# Patient Record
Sex: Male | Born: 1965
Health system: Southern US, Community
[De-identification: ages and names within clinical notes are randomized; demographics above are authoritative.]

## PROBLEM LIST (undated history)

## (undated) DIAGNOSIS — I82409 Acute embolism and thrombosis of unspecified deep veins of unspecified lower extremity: Secondary | ICD-10-CM

## (undated) DIAGNOSIS — I2699 Other pulmonary embolism without acute cor pulmonale: Secondary | ICD-10-CM

---

## 2007-11-01 ENCOUNTER — Emergency Department (HOSPITAL_COMMUNITY): Admission: EM | Admit: 2007-11-01 | Discharge: 2007-11-01 | Payer: Self-pay | Admitting: Emergency Medicine

## 2007-11-23 ENCOUNTER — Emergency Department (HOSPITAL_COMMUNITY): Admission: EM | Admit: 2007-11-23 | Discharge: 2007-11-23 | Payer: Self-pay | Admitting: Emergency Medicine

## 2008-04-19 ENCOUNTER — Ambulatory Visit: Payer: Self-pay | Admitting: Psychiatry

## 2008-04-19 ENCOUNTER — Inpatient Hospital Stay (HOSPITAL_COMMUNITY): Admission: RE | Admit: 2008-04-19 | Discharge: 2008-04-25 | Payer: Self-pay | Admitting: Psychiatry

## 2008-04-19 ENCOUNTER — Emergency Department (HOSPITAL_COMMUNITY): Admission: EM | Admit: 2008-04-19 | Discharge: 2008-04-19 | Payer: Self-pay | Admitting: Emergency Medicine

## 2010-06-30 LAB — RAPID URINE DRUG SCREEN, HOSP PERFORMED
Barbiturates: NOT DETECTED
Cocaine: POSITIVE — AB
Opiates: NOT DETECTED

## 2010-06-30 LAB — CBC
HCT: 39.9 % (ref 39.0–52.0)
Hemoglobin: 13 g/dL (ref 13.0–17.0)
MCHC: 32.6 g/dL (ref 30.0–36.0)
MCV: 80.2 fL (ref 78.0–100.0)
RDW: 15.1 % (ref 11.5–15.5)

## 2010-06-30 LAB — DIFFERENTIAL
Basophils Absolute: 0 10*3/uL (ref 0.0–0.1)
Basophils Relative: 0 % (ref 0–1)
Eosinophils Absolute: 0 10*3/uL (ref 0.0–0.7)
Eosinophils Relative: 0 % (ref 0–5)
Lymphocytes Relative: 9 % — ABNORMAL LOW (ref 12–46)
Monocytes Absolute: 0.5 10*3/uL (ref 0.1–1.0)

## 2010-06-30 LAB — HEPATIC FUNCTION PANEL
ALT: 25 U/L (ref 0–53)
Bilirubin, Direct: 0.2 mg/dL (ref 0.0–0.3)
Indirect Bilirubin: 0.7 mg/dL (ref 0.3–0.9)

## 2010-06-30 LAB — BASIC METABOLIC PANEL
BUN: 11 mg/dL (ref 6–23)
CO2: 24 mEq/L (ref 19–32)
Chloride: 101 mEq/L (ref 96–112)
Glucose, Bld: 99 mg/dL (ref 70–99)
Potassium: 3.6 mEq/L (ref 3.5–5.1)
Sodium: 137 mEq/L (ref 135–145)

## 2010-07-28 NOTE — H&P (Signed)
NAMEJAQUAY, POSTHUMUS NO.:  [id number]   MEDICAL RECORD NO.:  [id number]          PATIENT TYPE:  IPS   LOCATION:  0508                          FACILITY:  [facility]   PHYSICIAN:  Vic Ripper, P.A.-C.DATE OF BIRTH:  [birth year]   DATE OF ADMISSION:  04/19/2008  DATE OF DISCHARGE:                       PSYCHIATRIC ADMISSION ASSESSMENT   This is an involuntary admission to the services of Dr. Geoffery Lyons.   IDENTIFYING INFORMATION:  This is a 45 year old, married, African  American male.  He presented to [facility] ED requesting detox and  reporting suicidal ideation.  He stated he needed to be detoxed from  alcohol and cocaine.  He said he wanted to die.  He stated that he hears  voices that tell him he is bad.  Right now, he feels so bad he does not  want to be around people and feels it would be better off if he did just  die.   PAST PSYCHIATRIC HISTORY:  Apparently he does not have any.   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  He has been  married once.  He has an 31 year old son, twins, boy and girl, age 7,  and a 93 year old daughter.  He last worked approximately 3 to 4 weeks  ago and has lost his employment.   FAMILY HISTORY:  He denies.   ALCOHOL AND DRUG HISTORY:  He reports he drinks a pint daily.   PRIMARY CARE Raydell Maners:  [facility].   MEDICAL PROBLEMS:  He has in the past been treated for a swollen  prostate.   MEDICATIONS:  He is not prescribed any.   DRUG ALLERGIES:  No known drug allergies.   POSITIVE PHYSICAL FINDINGS:  His alcohol level was 67.  His UDS was  positive for cocaine.  Vital signs on admission to our unit show he is  67 inches tall, weighs 151, temperature is 97.3, blood pressure is  110/66 to 103/74, pulse is 51, and respirations 16.   MENTAL STATUS EXAM:  He had just showered.  He was appropriately  groomed.  He was dressed in hospital scrubs.  He does appear to be  adequately nourished.  His  speech is a little bit slow.  Dr. Lolly Mustache  actually found him to have some thought blocking, although I did not.  His mood is quite depressed.  He is somewhat neurovegetative.  His  thought processes are not completely rational or goal oriented.  He does  not have an active plan today for committing suicide, but he just feels  he would be better off dead.  He is not homicidal.  He reports that he  is still hearing voices a little bit.   DIAGNOSES:   AXIS I:  1. Major depressive disorder with psychotic features.  2. Auditory hallucinations.  3. Alcohol and cocaine abuse.   AXIS II:  Deferred.   AXIS III:  None known.   AXIS IV:  Problems with support group, occupational, economic issues.   AXIS V:  Thirty.   The plan is to admit for safety and stabilization.  He was given Librium  q.6h to help with alcohol withdrawal and anxiety.  He was also ordered  Zyprexa Zydis 5 mg p.o. q.6h p.r.n. psychotic symptoms, and we are going  to go ahead and start some Wellbutrin on him, XL 150 mg p.o. q.a.m.,  starting today.   ESTIMATED LENGTH OF STAY:  Three to 5 days.      Vic Ripper, P.A.-C.     MD/MEDQ  D:  04/20/2008  T:  04/20/2008  Job:  [phone]

## 2010-07-31 NOTE — Discharge Summary (Signed)
NAMEMART, COLPITTS NO.:  [id number]   MEDICAL RECORD NO.:  [id number]          PATIENT TYPE:  IPS   LOCATION:  0503                          FACILITY:  BH   PHYSICIAN:  Anselm Jungling, MD  DATE OF BIRTH:  Apr 22, 1965   DATE OF ADMISSION:  04/19/2008  DATE OF DISCHARGE:  04/25/2008                               DISCHARGE SUMMARY   IDENTIFYING DATA AND REASON FOR ADMISSION:  This was an inpatient  psychiatric admission for Peter Washington, a 45 year old African American male who  requested admission due to increasing depression and suicidal ideation.  He had stressors consisting of being laid off from work, financial  problems, and marital problems.  He also admitted to using alcohol and  cocaine in a problematic way.  Please refer to the admission note for  further details pertaining to the symptoms, circumstances and history  that led to his hospitalization.  He was given an initial Axis I  diagnosis of mood disorder NOS, rule out major depressive disorder with  psychotic features, and rule out polysubstance abuse.   MEDICAL AND LABORATORY:  The patient was medically and physically  assessed by the psychiatric nurse practitioner.  He was in good health  without any active or chronic medical problems.  There were no  significant medical issues.   HOSPITAL COURSE:  The patient was admitted to the adult inpatient  psychiatric service.  He presented as a well-nourished, normally-  developed male who was irritable, restless and dysphoric.  He did not  appear to be psychotic.  He was placed on a Librium withdrawal protocol.  He stated that he wanted to be screened for bipolar.  He stated that  his grandmother had bipolar disorder.  He described mood volatility and  irritability and mood swings.  He also described people conspiring  against him, and being able to hear them whisper.   The patient was placed on a regimen of Depakote and Wellbutrin.  These  were well  tolerated.  He indicated that he felt well on these  medications with improved sleep and decreased irritability.   On the seventh hospital day, the patient appeared appropriate for  discharge and was comfortable with this.  He agreed to the following  aftercare plan.   AFTERCARE:  The patient was to follow up at [facility] with  an appointment on April 29, 2008 at 8 a.m.   DISCHARGE MEDICATIONS:  1. Wellbutrin XL 150 mg daily.  2. Depakote 1000 mg daily.   DISCHARGE DIAGNOSES:  AXIS I:  Schizoaffective disorder not otherwise  specified.  AXIS II:  Deferred.  AXIS III:  No acute or chronic illnesses.  AXIS IV:  Stressors severe.  AXIS V:  GAF on discharge 55.      Anselm Jungling, MD  Electronically Signed     SPB/MEDQ  D:  04/29/2008  T:  04/29/2008  Job:  [phone]

## 2010-12-16 LAB — URINALYSIS, ROUTINE W REFLEX MICROSCOPIC
Bilirubin Urine: NEGATIVE
Nitrite: NEGATIVE
Specific Gravity, Urine: <1.005 — ABNORMAL LOW
Urobilinogen, UA: 0.2

## 2013-11-05 ENCOUNTER — Ambulatory Visit (HOSPITAL_COMMUNITY): Payer: Self-pay | Attending: Internal Medicine

## 2013-12-29 ENCOUNTER — Observation Stay (HOSPITAL_COMMUNITY)
Admission: EM | Admit: 2013-12-29 | Discharge: 2013-12-30 | Disposition: A | Discharge status: Home / Self Care | Payer: Self-pay | Attending: Internal Medicine | Admitting: Internal Medicine

## 2013-12-29 ENCOUNTER — Emergency Department (HOSPITAL_COMMUNITY): Payer: Self-pay

## 2013-12-29 ENCOUNTER — Encounter (HOSPITAL_COMMUNITY): Payer: Self-pay | Admitting: Emergency Medicine

## 2013-12-29 DIAGNOSIS — I82402 Acute embolism and thrombosis of unspecified deep veins of left lower extremity: Secondary | ICD-10-CM

## 2013-12-29 DIAGNOSIS — F419 Anxiety disorder, unspecified: Secondary | ICD-10-CM | POA: Insufficient documentation

## 2013-12-29 DIAGNOSIS — M79609 Pain in unspecified limb: Secondary | ICD-10-CM

## 2013-12-29 DIAGNOSIS — R0781 Pleurodynia: Secondary | ICD-10-CM | POA: Insufficient documentation

## 2013-12-29 DIAGNOSIS — Z86711 Personal history of pulmonary embolism: Secondary | ICD-10-CM | POA: Insufficient documentation

## 2013-12-29 DIAGNOSIS — R0600 Dyspnea, unspecified: Secondary | ICD-10-CM

## 2013-12-29 DIAGNOSIS — Z720 Tobacco use: Secondary | ICD-10-CM | POA: Insufficient documentation

## 2013-12-29 DIAGNOSIS — F1410 Cocaine abuse, uncomplicated: Secondary | ICD-10-CM

## 2013-12-29 DIAGNOSIS — I82409 Acute embolism and thrombosis of unspecified deep veins of unspecified lower extremity: Secondary | ICD-10-CM

## 2013-12-29 DIAGNOSIS — R600 Localized edema: Secondary | ICD-10-CM

## 2013-12-29 DIAGNOSIS — R079 Chest pain, unspecified: Principal | ICD-10-CM

## 2013-12-29 DIAGNOSIS — R911 Solitary pulmonary nodule: Secondary | ICD-10-CM

## 2013-12-29 HISTORY — DX: Acute embolism and thrombosis of unspecified deep veins of unspecified lower extremity: I82.409

## 2013-12-29 HISTORY — DX: Other pulmonary embolism without acute cor pulmonale: I26.99

## 2013-12-29 LAB — BASIC METABOLIC PANEL
Anion gap: 14 (ref 5–15)
BUN: 15 mg/dL (ref 6–23)
CHLORIDE: 100 meq/L (ref 96–112)
CO2: 24 mEq/L (ref 19–32)
CREATININE: 1.14 mg/dL (ref 0.50–1.35)
Calcium: 9.2 mg/dL (ref 8.4–10.5)
GFR calc non Af Amer: 73 mL/min — ABNORMAL LOW (ref >90)
GFR, EST AFRICAN AMERICAN: 85 mL/min — AB (ref >90)
GLUCOSE: 79 mg/dL (ref 70–99)
POTASSIUM: 4.2 meq/L (ref 3.7–5.3)
Sodium: 138 mEq/L (ref 137–147)

## 2013-12-29 LAB — CBC
HEMATOCRIT: 37.4 % — AB (ref 39.0–52.0)
HEMOGLOBIN: 12.5 g/dL — AB (ref 13.0–17.0)
MCH: 26 pg (ref 26.0–34.0)
MCHC: 33.4 g/dL (ref 30.0–36.0)
MCV: 77.9 fL — AB (ref 78.0–100.0)
Platelets: 219 10*3/uL (ref 150–400)
RBC: 4.8 MIL/uL (ref 4.22–5.81)
RDW: 15.4 % (ref 11.5–15.5)
WBC: 7 10*3/uL (ref 4.0–10.5)

## 2013-12-29 LAB — I-STAT CHEM 8, ED
BUN: 15 mg/dL (ref 6–23)
CALCIUM ION: 1.17 mmol/L (ref 1.12–1.23)
CREATININE: 1.1 mg/dL (ref 0.50–1.35)
Chloride: 107 mEq/L (ref 96–112)
GLUCOSE: 79 mg/dL (ref 70–99)
HEMATOCRIT: 43 % (ref 39.0–52.0)
HEMOGLOBIN: 14.6 g/dL (ref 13.0–17.0)
POTASSIUM: 4.1 meq/L (ref 3.7–5.3)
Sodium: 139 mEq/L (ref 137–147)
TCO2: 24 mmol/L (ref 0–100)

## 2013-12-29 LAB — RAPID URINE DRUG SCREEN, HOSP PERFORMED
AMPHETAMINES: NOT DETECTED
BENZODIAZEPINES: NOT DETECTED
Barbiturates: NOT DETECTED
COCAINE: POSITIVE — AB
OPIATES: POSITIVE — AB
TETRAHYDROCANNABINOL: NOT DETECTED

## 2013-12-29 LAB — TROPONIN I
Troponin I: <0.3 ng/mL (ref <0.30)
Troponin I: <0.3 ng/mL (ref <0.30)

## 2013-12-29 LAB — I-STAT TROPONIN, ED
TROPONIN I, POC: 0.01 ng/mL (ref 0.00–0.08)
Troponin i, poc: 0.01 ng/mL (ref 0.00–0.08)

## 2013-12-29 LAB — PROTIME-INR
INR: 1.13 (ref 0.00–1.49)
PROTHROMBIN TIME: 14.6 s (ref 11.6–15.2)

## 2013-12-29 LAB — ANTITHROMBIN III: AntiThromb III Func: 76 % (ref 75–120)

## 2013-12-29 LAB — LIPASE, BLOOD: LIPASE: 47 U/L (ref 11–59)

## 2013-12-29 LAB — APTT: APTT: 29 s (ref 24–37)

## 2013-12-29 MED ORDER — HEPARIN (PORCINE) IN NACL 100-0.45 UNIT/ML-% IJ SOLN
1200.0000 [IU]/h | INTRAMUSCULAR | Status: AC
  Administered 2013-12-29: 1100 [IU]/h via INTRAVENOUS
  Filled 2013-12-29 (×2): qty 250

## 2013-12-29 MED ORDER — KETOROLAC TROMETHAMINE 30 MG/ML IJ SOLN
30.0000 mg | Freq: Three times a day (TID) | INTRAMUSCULAR | Status: DC | PRN
  Administered 2013-12-29 – 2013-12-30 (×2): 30 mg via INTRAVENOUS
  Filled 2013-12-29 (×2): qty 1

## 2013-12-29 MED ORDER — IOHEXOL 350 MG/ML SOLN
80.0000 mL | Freq: Once | INTRAVENOUS | Status: AC | PRN
  Administered 2013-12-29: 80 mL via INTRAVENOUS

## 2013-12-29 MED ORDER — HEPARIN BOLUS VIA INFUSION
4000.0000 [IU] | Freq: Once | INTRAVENOUS | Status: AC
  Administered 2013-12-29: 4000 [IU] via INTRAVENOUS
  Filled 2013-12-29: qty 4000

## 2013-12-29 MED ORDER — MORPHINE SULFATE 4 MG/ML IJ SOLN
4.0000 mg | Freq: Once | INTRAMUSCULAR | Status: AC
  Administered 2013-12-29: 4 mg via INTRAVENOUS
  Filled 2013-12-29: qty 1

## 2013-12-29 MED ORDER — SODIUM CHLORIDE 0.9 % IJ SOLN: 3.0000 mL | Freq: Two times a day (BID) | INTRAMUSCULAR | Status: DC

## 2013-12-29 MED ORDER — KETOROLAC TROMETHAMINE 30 MG/ML IJ SOLN
30.0000 mg | Freq: Once | INTRAMUSCULAR | Status: AC
  Administered 2013-12-29: 30 mg via INTRAVENOUS
  Filled 2013-12-29: qty 1

## 2013-12-29 MED ORDER — LORAZEPAM 2 MG/ML IJ SOLN
1.0000 mg | Freq: Once | INTRAMUSCULAR | Status: AC
Start: 2013-12-29 — End: 2013-12-29
  Administered 2013-12-29: 1 mg via INTRAVENOUS
  Filled 2013-12-29: qty 1

## 2013-12-29 MED ORDER — ACETAMINOPHEN 650 MG RE SUPP: 650.0000 mg | Freq: Four times a day (QID) | RECTAL | Status: DC | PRN

## 2013-12-29 MED ORDER — ACETAMINOPHEN 325 MG PO TABS: 650.0000 mg | ORAL_TABLET | Freq: Four times a day (QID) | ORAL | Status: DC | PRN

## 2013-12-29 NOTE — ED Notes (Signed)
Pt returned from vascular study.

## 2013-12-29 NOTE — Progress Notes (Signed)
ANTICOAGULATION CONSULT NOTE - Initial Consult  Pharmacy Consult for Heparin Indication: DVT  No Known Allergies  Patient Measurements: Height: 5\' 7"  (170.2 cm) Weight: 149 lb 12.8 oz (67.949 kg) IBW/kg (Calculated) : 66.1 Heparin Dosing Weight: 68 kg  Vital Signs: Temp: 98.4 F (36.9 C) (10/17 0724) Temp Source: Oral (10/17 0724) BP: 133/81 mmHg (10/17 1630) Pulse Rate: 62 (10/17 1630)  Labs:  Recent Labs  12/29/13 0745 12/29/13 0754 12/29/13 1304 12/29/13 1509  HGB 12.5* 14.6  --   --   HCT 37.4* 43.0  --   --   PLT 219  --   --   --   APTT  --   --  29  --   LABPROT  --   --  14.6  --   INR  --   --  1.13  --   CREATININE 1.14 1.10  --   --   TROPONINI  --   --   --  <0.30    Estimated Creatinine Clearance: 75.1 ml/min (by C-G formula based on Cr of 1.1).   Medical History: Past Medical History  Diagnosis Date  . PE (pulmonary embolism)   . 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 CTA was negative for PE however the patient was noted to have another acute LLE DVT and pharmacy was consulted to start heparin for anticoagulation. It appears plans are to transition to oral anticoagulation with Xarelto or Eliquis soon. Hep Wt: 68 kg, baseline CBC wnl.   Goal of Therapy:  Heparin level 0.3-0.7 units/ml Monitor platelets by anticoagulation protocol: Yes   Plan:  1. Heparin bolus of 4000 units x 1 2. Initiate heparin drip at a rate of 1100 units/hr 3. Daily heparin level 4. Will f/u plans to transition to oral anticoagulation 5. Will continue to monitor for any signs/symptoms of bleeding and will follow up with heparin level in 6 hours   Georgina PillionElizabeth Jaylyne Breese, PharmD, BCPS Clinical Pharmacist Pager: [phone] 12/29/2013 5:51 PM

## 2013-12-29 NOTE — ED Provider Notes (Signed)
CSN: [id number]     Arrival date & time 12/29/13  0716 History   First MD Initiated Contact with Patient 12/29/13 [phone]     Chief Complaint  Patient presents with  . Shortness of Breath     (Consider location/radiation/quality/duration/timing/severity/associated sxs/prior Treatment) HPI   Pt with hx DVT (2005), PE (2007) p/w left sided chest pain and SOB.  SOB is pleuritic, "feels identical" to prior PE, 8/10 intensity, crampy.  Associated anxiety.  Pain has been gradually worsening over 1 week, cough productive of white sputum.  Pain was initially on the right and was constant x 1 week, overnight started on the left and is much worse than right side.  Has chronic LLE swelling since DVT years ago, states it has gotten worse again and he has new pain in his left calf.  Denies fevers, URI symptoms, abdominal pain, change in leg swelling.  Pt states he smokes cigarettes and occasionally smokes marijuana - states he actually has no concerns about smoking marijuana and it being laced with cocaine, states he said this because he was panicked because he felt EMS was not as concerned about his hx of blood clots and he thought they should be.  Does admit to cocaine use last night.   Pt states he was treated for 6 months with lovenox and heparin for PE.   No PCP  Past Medical History  Diagnosis Date  . PE (pulmonary embolism)   . DVT of lower extremity (deep venous thrombosis)    History reviewed. No pertinent past surgical history. No family history on file. History  Substance Use Topics  . Smoking status: Current Every Day Smoker  . Smokeless tobacco: Not on file  . Alcohol Use: Yes    Review of Systems  All other systems reviewed and are negative.     Allergies  Review of patient's allergies indicates no known allergies.  Home Medications   Prior to Admission medications   Not on File   BP 139/76  Pulse 70  Temp(Src) 98.4 F (36.9 C) (Oral)  Resp 22  Ht 5\' 5"  (1.651 m)   Wt 145 lb (65.772 kg)  BMI 24.13 kg/m2  SpO2 100% Physical Exam  Nursing note and vitals reviewed. Constitutional: He appears well-developed and well-nourished. No distress.  HENT:  Head: Normocephalic and atraumatic.  Neck: Neck supple.  Cardiovascular: Normal rate, regular rhythm, normal heart sounds and intact distal pulses.   Pulmonary/Chest: Effort normal and breath sounds normal. No accessory muscle usage. Not tachypneic. No respiratory distress. He has no decreased breath sounds. He has no wheezes. He has no rhonchi. He has no rales.  Abdominal: Soft. He exhibits no distension. There is no tenderness. There is no rebound and no guarding.  Musculoskeletal:  LLE calf larger than right.  Nontender. No erythema or warmth.   Neurological: He is alert.  Skin: He is not diaphoretic.  Psychiatric: His speech is normal and behavior is normal. Thought content normal. His mood appears anxious.    ED Course  Procedures (including critical care time) Labs Review Labs Reviewed  CBC - Abnormal; Notable for the following:    Hemoglobin 12.5 (*)    HCT 37.4 (*)    MCV 77.9 (*)    All other components within normal limits  BASIC METABOLIC PANEL - Abnormal; Notable for the following:    GFR calc non Af Amer 73 (*)    GFR calc Af Amer 85 (*)    All other components within normal  limits  URINE RAPID DRUG SCREEN (HOSP PERFORMED) - Abnormal; Notable for the following:    Opiates POSITIVE (*)    Cocaine POSITIVE (*)    All other components within normal limits  PROTIME-INR  APTT  I-STAT TROPOININ, ED  I-STAT CHEM 8, ED  I-STAT TROPOININ, ED    Imaging Review Ct Angio Chest W/cm &/or Wo Cm  12/29/2013   CLINICAL DATA:  Short of breath.  Left-sided and substernal pain  EXAM: CT ANGIOGRAPHY CHEST WITH CONTRAST  TECHNIQUE: Multidetector CT imaging of the chest was performed using the standard protocol during bolus administration of intravenous contrast. Multiplanar CT image reconstructions  and MIPs were obtained to evaluate the vascular anatomy.  CONTRAST:  80mL OMNIPAQUE IOHEXOL 350 MG/ML SOLN  COMPARISON:  None.  FINDINGS: Mediastinum: Heart size appears normal. There is no pericardial effusion. The trachea appears patent and is midline. The esophagus appears normal. The main pulmonary artery appears normal. No lobar or segmental pulmonary artery filling defects identified to suggest a clinically significant acute pulmonary embolus.  Lungs/Pleura: No pleural effusion identified. There is no airspace consolidation or atelectasis. 4 mm left base nodule is identified, image 82/ series 406.  Upper Abdomen: Calcified granuloma is identified within the liver. No acute findings identified within the upper abdomen.  Musculoskeletal: Review of the visualized osseous structures is significant for mild thoracic spondylosis.  Review of the MIP images confirms the above findings.  IMPRESSION: 1. No evidence for acute pulmonary embolus. 2. 4 mm left base nodule. If the patient is at high risk for bronchogenic carcinoma, follow-up chest CT at 1 year is recommended. If the patient is at low risk, no follow-up is needed. This recommendation follows the consensus statement: Guidelines for Management of Small Pulmonary Nodules Detected on CT Scans: A Statement from the Fleischner Society as published in Radiology 2005; 237:395-400.   Electronically Signed   By: Signa Kellaylor  Stroud M.D.   On: 12/29/2013 09:45     EKG Interpretation None      8:07 AM Discussed pt with Dr Loretha StaplerWofford.  I personally interpreted the EKG and discussed with Dr Loretha StaplerWofford.    11:58 AM Per sonographer (Candace), pt has acute and chronic clot in popliteal, acute clot in peroneal vein.    12:44 PM Discussed pt with Internal Medicine Teaching Service.  Will start heparin drip per admitting MD request.  Ordered per pharmacy consult.    MDM   Final diagnoses:  Chest pain, unspecified chest pain type  DVT (deep venous thrombosis), left   Cocaine abuse    Afebrile nontoxic patient with hx DVT, PE (?unprovoked, pt did have broken ankle at time of PE) p/w pleuritic chest pain, SOB that feels like prior PE.  O2 100% on room air, given O2 by [place] for comfort.  CT angio chest negative for clot, pneumonia, pneumothorax.  4mm nodule found and patient is a smoker- I have advised him of this finding and informed him that he needs follow up for repeat scan in 1 year per radiology recommended guidelines.  Pt verbalizes understanding.  EKG shows LVH, nonischemic. Labs, troponin unremarkable.  Drug screen positive for cocaine.  LLE doppler positive for clot.  Admitted to unassigned medicine, Internal Medicine Teaching Service for chest pain in the setting of cocaine use and acute DVT.       Trixie Dredgemily Charlea Nardo, PA-C 12/29/13 1249

## 2013-12-29 NOTE — ED Notes (Signed)
Patient transported to Ultrasound 

## 2013-12-29 NOTE — H&P (Signed)
Date: 12/29/2013               Patient Name:  Peter Washington MRN: [id number]  DOB: 08/21/1965 Age / Sex: 48 y.o., male   PCP: No Pcp Per Patient         Medical Service: Internal Medicine Teaching Service         Attending Physician: Dr. Doneen Poisson    First Contact: Dr. Rich Number Pager: [phone]  Second Contact: Dr. 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 Patient states his CP started 1 week ago. He states the pain is left-sided, 6/10 in severity, pleuritic, and radiates to his back. The pain was intermittent when it first occurred, but has been constant for the past 3-4 days. He notes his pain was a 10/10 when he came to the ED but improved to 6/10 after receiving Toradol. He notes the pain is very similar to when he had his PE. Patient states he had an extensive work up for his last PE and believes he was told that it may be hereditary. He states his SOB started about 1 week ago as well. Patient notes he has chronic LLE, but he has noticed that his leg has become even more swollen compared to normal. He states his right leg has also been swollen. He reports diaphoresis, but denies nausea, vomiting. He reports a 30 pack year smoking history and admits to smoking marijuana yesterday that may have been laced with cocaine while he was "at the club."   In the ED, he had a lower extremity doppler which showed an acute DVT in the left peroneal veins. CT Angio Chest was negative for PE.   Meds: No current facility-administered medications for this encounter.   No current outpatient prescriptions on file.     Allergies: Allergies as of 12/29/2013  . (No Known Allergies)   Past Medical History  Diagnosis Date  . PE (pulmonary embolism)   . DVT of lower extremity (deep venous thrombosis)    History reviewed. No pertinent past surgical history. No family history on file. History   Social History  . Marital Status: Single    Spouse Name: N/A    Number of Children: N/A  . Years of Education: N/A   Occupational History  . Not on file.   Social History Main Topics  . Smoking status: Current Every Day Smoker  . Smokeless tobacco: Not on file  . Alcohol Use: Yes  . Drug Use: Yes    Special: Marijuana  . Sexual Activity: Not on file   Other Topics Concern  . Not on file   Social History Narrative  . No narrative on file    Review of Systems: General: Denies fever, chills, night sweats, changes in weight, changes in appetite HEENT: Denies headaches, ear pain, changes in vision, rhinorrhea, sore throat CV: See HPI Pulm: Denies cough, wheezing GI: Denies abdominal pain, diarrhea, constipation, melena, hematochezia GU: Denies dysuria, hematuria, frequency Msk: Denies joint pains Neuro: Denies weakness, numbness, tingling Skin: Denies rashes, bruising  Physical Exam: Blood pressure 113/70,  pulse 50, temperature 98.4 F (36.9 C), temperature source Oral, resp. rate 12, height 5\' 5"  (1.651 m), weight 145 lb (65.772 kg), SpO2 100.00%. General: alert, laying in bed, breathing comfortably on oxygen via [place] HEENT: [place]/AT, EOMI, PERRL, sclera anicteric, mucus membranes moist CV: bradycardic, normal S1/S2, no m/g/r Pulm: CTA bilaterally, breaths non-labored on 2L oxygen via [place] Abd: BS+, soft, non-distended, minimal tenderness to palpation of LUQ Ext: 2+ pitting edema in bilateral lower extremities, mild tenderness to palpation of left calve, L slightly larger than R Neuro: alert and oriented x 3, CNs II-XII intact  Lab results: Basic Metabolic Panel:  Recent Labs   12/29/13 0745 12/29/13 0754  NA 138 139  K 4.2 4.1  CL 100 107  CO2 24  --   GLUCOSE 79 79  BUN 15 15  CREATININE 1.14 1.10  CALCIUM 9.2  --    Liver Function Tests: No results found for this basename: AST, ALT, ALKPHOS, BILITOT, PROT, ALBUMIN,  in the last 72 hours No results found for this basename: LIPASE, AMYLASE,  in the last 72 hours No results found for this basename: AMMONIA,  in the last 72 hours CBC:  Recent Labs  12/29/13 0745 12/29/13 0754  WBC 7.0  --   HGB 12.5* 14.6  HCT 37.4* 43.0  MCV 77.9*  --   PLT 219  --    Cardiac Enzymes: No results found for this basename: CKTOTAL, CKMB, CKMBINDEX, TROPONINI,  in the last 72 hours BNP: No results found for this basename: PROBNP,  in the last 72 hours D-Dimer: No results found for this basename: DDIMER,  in the last 72 hours CBG: No results found for this basename: GLUCAP,  in the last 72 hours Hemoglobin A1C: No results found for this basename: HGBA1C,  in the last 72 hours Fasting Lipid Panel: No results found for this basename: CHOL, HDL, LDLCALC, TRIG, CHOLHDL, LDLDIRECT,  in the last 72 hours Thyroid Function Tests: No results found for this basename: TSH, T4TOTAL, FREET4, T3FREE, THYROIDAB,  in the last 72 hours Anemia Panel: No results found for this basename: VITAMINB12, FOLATE, FERRITIN, TIBC, IRON, RETICCTPCT,  in the last 72 hours Coagulation:  Recent Labs  12/29/13 1304  LABPROT 14.6  INR 1.13   Urine Drug Screen: Drugs of Abuse     Component Value Date/Time   LABOPIA POSITIVE* 12/29/2013 0930   COCAINSCRNUR POSITIVE* 12/29/2013 0930   LABBENZ NONE DETECTED 12/29/2013 0930   AMPHETMU NONE DETECTED 12/29/2013 0930   THCU NONE DETECTED 12/29/2013 0930   LABBARB NONE DETECTED 12/29/2013 0930    Alcohol Level: No results found for this basename: ETH,  in the last 72 hours Urinalysis: No results found for this basename: COLORURINE, APPERANCEUR, LABSPEC, PHURINE, GLUCOSEU, HGBUR,  BILIRUBINUR, KETONESUR, PROTEINUR, UROBILINOGEN, NITRITE, LEUKOCYTESUR,  in the last 72 hours   Imaging results:  Ct Angio Chest W/cm &/or Wo Cm  12/29/2013   CLINICAL DATA:  Short of breath.  Left-sided and substernal pain  EXAM: CT ANGIOGRAPHY CHEST WITH CONTRAST  TECHNIQUE: Multidetector CT imaging of the chest was performed using the standard protocol during bolus administration of intravenous contrast. Multiplanar CT image reconstructions and MIPs were obtained to evaluate the vascular anatomy.  CONTRAST:  80mL OMNIPAQUE IOHEXOL 350 MG/ML SOLN  COMPARISON:  None.  FINDINGS: Mediastinum: Heart size appears normal. There is no pericardial effusion. The trachea appears patent and is midline. The esophagus appears normal. The main pulmonary artery appears normal. No lobar or segmental pulmonary  artery filling defects identified to suggest a clinically significant acute pulmonary embolus.  Lungs/Pleura: No pleural effusion identified. There is no airspace consolidation or atelectasis. 4 mm left base nodule is identified, image 82/ series 406.  Upper Abdomen: Calcified granuloma is identified within the liver. No acute findings identified within the upper abdomen.  Musculoskeletal: Review of the visualized osseous structures is significant for mild thoracic spondylosis.  Review of the MIP images confirms the above findings.  IMPRESSION: 1. No evidence for acute pulmonary embolus. 2. 4 mm left base nodule. If the patient is at high risk for bronchogenic carcinoma, follow-up chest CT at 1 year is recommended. If the patient is at low risk, no follow-up is needed. This recommendation follows the consensus statement: Guidelines for Management of Small Pulmonary Nodules Detected on CT Scans: A Statement from the Fleischner Society as published in Radiology 2005; 237:395-400.   Electronically Signed   By: Signa Kell M.D.   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  1. Acute Left Lower Extremity DVT: Patient has history of DVT (2005) and PE (2007) for which he completed 6 months of Lovenox therapy. Patient states after his PE, his doctor tried to place him on coumadin but that "it did not work." He states he was not able to become therapeutic on coumadin and that he was told he likely had a hereditary condition. This was when the patient lived in [place]. Patient notes his grandfather died from blood clots. Patient presented to the ED with a 1 week history of chest pain and SOB. Patient states this presentation was very similar to the last time he had a PE. On LE doppler, he was found to have an acute DVT in his left peroneal veins. CT Angio Chest was negative for PE. 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 He will need to be on life-long anticoagulation. Unsure of why he was not continued on anticoagulation therapy after his second event. Since patient previously failed coumadin he will likely need to be placed on Xarelto or Eliquis. A hypercoaguable workup also necessary since patient was previously told he may have a hereditary condition.   - Start Heparin, will likely transition to Xarelto or Eliquis - Cardiac monitoring - Protein C and S - Lupus anticoagulant - Antithrombin III - Beta-2-glycoprotein - Homocysteine - Factor V Leiden - Cardiolipin - Prothrombin gene mutation  2. Chest Pain: Patient presented with 1 week history of chest pain and SOB. He noted CP was left-sided, 6/10 in severity, pleuritic, and radiated to his back. The pain was originally intermittent, but then has been constant for the past 3-4 days. He notes his pain was a 10/10 when he came to the ED but improved to 6/10 after receiving  Toradol. He notes the pain is very similar to when he had his PE. Troponin negative x 1. EKG showed LVH, minimal ST elevation in V3, V4. He has no prior for comparison. CT Angio Chest negative for PE. CP likely not ACS. 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 Patient did have some mild LUQ abdominal tenderness to palpation, which  could be source for pain. Will check lipase for pancreatitis. - Trend troponins - Repeat EKG - Check lipase - Cardiac monitoring - Tylenol for pain  3. Lung Nodule: On CT Angio Chest, patient noted to have 4 mm left base nodule. Since he is an active smoker with 30+ pack year smoking history, it is recommended that he receive a repeat CT Chest in 1 year to evaluate the nodule.  Diet: Regular DVT/PE PPx: Heparin Dispo: Disposition is deferred at this time, awaiting improvement of current medical problems. Anticipated discharge in approximately 1-2 day(s).   The patient does not have a current PCP (No Pcp Per Patient) and [facility] after [facility].  The patient does not have transportation limitations that hinder transportation to clinic appointments.  Signed: Rich Numberarly Shayde Gervacio, MD 12/29/2013, 2:48 PM

## 2013-12-29 NOTE — ED Notes (Signed)
Received pt via EMS with c/o shortness of breath onset 1 hour PTA. Pt smoked weed about an hour ago and thinks it may have been laced with cocaine. Pt has history of PE and blood clot to left leg. Pt reports harder to breathe on left side. Pt also c/o shortness of breath.

## 2013-12-29 NOTE — Progress Notes (Signed)
VASCULAR LAB PRELIMINARY  PRELIMINARY  PRELIMINARY  PRELIMINARY  Left lower extremity venous Doppler completed.    Preliminary report:  There is acute DVT noted in the left peroneal veins.  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   All other veins appear thrombus free.    Kharon Hixon, RVT 12/29/2013, 11:55 AM

## 2013-12-30 DIAGNOSIS — I82409 Acute embolism and thrombosis of unspecified deep veins of unspecified lower extremity: Secondary | ICD-10-CM

## 2013-12-30 DIAGNOSIS — R079 Chest pain, unspecified: Secondary | ICD-10-CM

## 2013-12-30 LAB — BASIC METABOLIC PANEL
Anion gap: 9 (ref 5–15)
BUN: 26 mg/dL — ABNORMAL HIGH (ref 6–23)
CALCIUM: 8.3 mg/dL — AB (ref 8.4–10.5)
CO2: 26 mEq/L (ref 19–32)
Chloride: 102 mEq/L (ref 96–112)
Creatinine, Ser: 1.17 mg/dL (ref 0.50–1.35)
GFR calc Af Amer: 82 mL/min — ABNORMAL LOW (ref >90)
GFR, EST NON AFRICAN AMERICAN: 71 mL/min — AB (ref >90)
Glucose, Bld: 111 mg/dL — ABNORMAL HIGH (ref 70–99)
POTASSIUM: 3.9 meq/L (ref 3.7–5.3)
SODIUM: 137 meq/L (ref 137–147)

## 2013-12-30 LAB — HEPARIN LEVEL (UNFRACTIONATED)
HEPARIN UNFRACTIONATED: 0.33 [IU]/mL (ref 0.30–0.70)
HEPARIN UNFRACTIONATED: 0.54 [IU]/mL (ref 0.30–0.70)

## 2013-12-30 LAB — CBC
HCT: 35.5 % — ABNORMAL LOW (ref 39.0–52.0)
HCT: 36.5 % — ABNORMAL LOW (ref 39.0–52.0)
Hemoglobin: 11.5 g/dL — ABNORMAL LOW (ref 13.0–17.0)
Hemoglobin: 12.2 g/dL — ABNORMAL LOW (ref 13.0–17.0)
MCH: 25.8 pg — ABNORMAL LOW (ref 26.0–34.0)
MCH: 25.7 pg — ABNORMAL LOW (ref 26.0–34.0)
MCHC: 32.4 g/dL (ref 30.0–36.0)
MCHC: 33.4 g/dL (ref 30.0–36.0)
MCV: 79.8 fL (ref 78.0–100.0)
MCV: 77 fL — ABNORMAL LOW (ref 78.0–100.0)
Platelets: 208 10*3/uL (ref 150–400)
Platelets: 205 10*3/uL (ref 150–400)
RBC: 4.45 MIL/uL (ref 4.22–5.81)
RBC: 4.74 MIL/uL (ref 4.22–5.81)
RDW: 15.8 % — ABNORMAL HIGH (ref 11.5–15.5)
RDW: 15.5 % (ref 11.5–15.5)
WBC: 5.9 10*3/uL (ref 4.0–10.5)
WBC: 5.6 10*3/uL (ref 4.0–10.5)

## 2013-12-30 LAB — HOMOCYSTEINE: HOMOCYSTEINE-NORM: 7 umol/L (ref 4.0–15.4)

## 2013-12-30 LAB — TROPONIN I

## 2013-12-30 MED ORDER — RIVAROXABAN 20 MG PO TABS: 20.0000 mg | ORAL_TABLET | Freq: Every day | ORAL | Status: DC

## 2013-12-30 MED ORDER — RIVAROXABAN 15 MG PO TABS: 15.0000 mg | ORAL_TABLET | Freq: Two times a day (BID) | ORAL | Status: DC

## 2013-12-30 MED ORDER — RIVAROXABAN 15 MG PO TABS
15.0000 mg | ORAL_TABLET | Freq: Two times a day (BID) | ORAL | Status: DC
  Administered 2013-12-30: 15 mg via ORAL
  Filled 2013-12-30: qty 1

## 2013-12-30 NOTE — ED Provider Notes (Signed)
Medical screening examination/treatment/procedure(s) were conducted as a shared visit with non-physician practitioner(s) and myself.  I personally evaluated the patient during the encounter.   EKG Interpretation   Date/Time:  Saturday December 29 2013 07:55:53 EDT Ventricular Rate:  78 PR Interval:  142 QRS Duration: 78 QT Interval:  426 QTC Calculation: 485 R Axis:   62 Text Interpretation:  Sinus rhythm Consider left ventricular hypertrophy  Anterior Q waves, possibly due to LVH nonspecific ST changes, no priors  for comparison Confirmed by [facility]  MD, TREY ([id number]) on 12/29/2013  11:49:13 AM      48 yo male presenting with chest pain and SOB.  He stated pain felt like when he had a PE.  Also endorsed smoking marijuana that he thought was laced with cocaine.  On exam, well appearing, nontoxic, not distressed, normal respiratory effort, normal perfusion, chest slightly tender to palpation, heart sounds normal with RRR, lungs CTAB.  CTA PE study negative for PE, initial troponin negative, but UDS positive for cocaine.  Also found to have DVT.  Admitted for anticoagulation and further cocaine chest pain workup.  Clinical Impression: 1. Chest pain, unspecified chest pain type   2. DVT (deep venous thrombosis), left   3. Cocaine abuse       Warnell Foresterrey Ellar Hakala, MD 12/30/13 [phone]

## 2013-12-30 NOTE — Progress Notes (Signed)
30 day Xarelto prescription and free 30 day assistance card. He also received a bus pass because he stated he had no money. Pt happy with services. He denies pain, assessment benign. D/c'd with staff to front door of hospital to catch the bus

## 2013-12-30 NOTE — Care Management Note (Signed)
    Page 1 of 1   12/30/2013     3:17:16 PM CARE MANAGEMENT NOTE 12/30/2013  Patient:  ASA, BAUDOIN   Account Number:  [id number]  Date Initiated:  12/30/2013  Documentation initiated by:  [facility]  Subjective/Objective Assessment:   adm:  chest pain and SOB     Action/Plan:   discharge planning   Anticipated DC Date:  12/30/2013   Anticipated DC Plan:        [facility]  CM consult  PCP issues      Choice offered to / List presented to:             Status of service:  Completed, signed off Medicare Important Message given?   (If response is "NO", the following Medicare IM given date fields will be blank) Date Medicare IM given:   Medicare IM given by:   Date Additional Medicare IM given:   Additional Medicare IM given by:    Discharge Disposition:  HOME/SELF CARE  Per UR Regulation:    If discussed at Long Length of Stay Meetings, dates discussed:    Comments:  12/30/13 15:00 Cm met with pt in room and agave him a pre-activated free 30 day trial Xarelto Card. CM also gave pt [place] pamphlet and pt verbalizes understanding he is to go to the  CLINIC any weekday morning from 9-10am and ask for: AN APPOINTMENT FOR A PRIMARY CARE PHYSICIAN; AN APPOINTMENT WITH A NAVIGAGTOR TO GET INSURANCE; AN APPOINTMENT FOR FOLLOW UP CARE.  Pt verbzlies understanding once he gets set up with the clinic, the clinic will ast with meds.  No other CM needs were communicated.  Mariane Masters, BSN, CM [phone].

## 2013-12-30 NOTE — Discharge Instructions (Addendum)
It was a pleasure taking care of you, [name].   It is important that you take your new medication, Xarelto, to prevent blood clots. You will take Xarelto 15 mg twice a day for the next 20 days and then be switched to a 20 mg daily dose. You will be called to set up an appointment in [facility] in the next 2 weeks. Please make this appointment so you can receive your refill prescription for Xarelto.   Information on my medicine - XARELTO (rivaroxaban)  WHY WAS XARELTO PRESCRIBED FOR YOU? Xarelto was prescribed to treat blood clots that may have been found in the veins of your legs (deep vein thrombosis) or in your lungs (pulmonary embolism) and to reduce the risk of them occurring again.  What do you need to know about Xarelto? The starting dose is one 15 mg tablet taken TWICE daily with food for the FIRST 21 DAYS then on (enter date)  01/20/14  the dose is changed to one 20 mg tablet taken ONCE A DAY with your evening meal.  DO NOT stop taking Xarelto without talking to the health care provider who prescribed the medication.  Refill your prescription for 20 mg tablets before you run out.  After discharge, you should have regular check-up appointments with your healthcare provider that is prescribing your Xarelto.  In the future your dose may need to be changed if your kidney function changes by a significant amount.  What do you do if you miss a dose? If you are taking Xarelto TWICE DAILY and you miss a dose, take it as soon as you remember. You may take two 15 mg tablets (total 30 mg) at the same time then resume your regularly scheduled 15 mg twice daily the next day.  If you are taking Xarelto ONCE DAILY and you miss a dose, take it as soon as you remember on the same day then continue your regularly scheduled once daily regimen the next day. Do not take two doses of Xarelto at the same time.   Important Safety Information Xarelto is  a blood thinner medicine that can cause bleeding. You should call your healthcare provider right away if you experience any of the following:   Bleeding from an injury or your nose that does not stop.   Unusual colored urine (red or dark brown) or unusual colored stools (red or black).   Unusual bruising for unknown reasons.   A serious fall or if you hit your head (even if there is no bleeding).  Some medicines may interact with Xarelto and might increase your risk of bleeding while on Xarelto. To help avoid this, consult your healthcare provider or pharmacist prior to using any new prescription or non-prescription medications, including herbals, vitamins, non-steroidal anti-inflammatory drugs (NSAIDs) and supplements.  This website has more information on Xarelto: [email or web address].xarelto.[email or web address].

## 2013-12-30 NOTE — Discharge Summary (Addendum)
Name: Peter Washington MRN: [id number] DOB: 02/11/1966 48 y.o. PCP: No Pcp Per Patient  Date of Admission: 12/29/2013  7:16 AM Date of Discharge: 12/30/2013 Attending Physician: Rocco SereneLawrence D Catrinia Racicot, MD  Discharge Diagnosis: 1.  Principal Problem:   Acute DVT (deep venous thrombosis) Active Problems:   Pleuritic chest pain   Dyspnea   Chest pain  Discharge Medications:   Medication List         Rivaroxaban 15 MG Tabs tablet  Commonly known as:  XARELTO  Take 1 tablet (15 mg total) by mouth 2 (two) times daily.        Disposition and follow-up:   [name] [facility].  At the hospital follow up visit please address:  1.  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 He was started on Xarelto 15 mg BID, this will need to be adjusted to 20 mg daily on 01/20/14. Pt received free 30 day trial card for Xarelto. He will need medication assistance after that, likely qualifies for Medicaid.  Lung Nodule: 4 mm nodule noted on CT Chest. Will need repeat CT Chest in 9 months since he is a smoker and at increased risk for bronchogenic carcinoma.   2.  Labs / imaging needed at time of follow-up: None  3.  Pending labs/ test needing follow-up: Hypercoaguable workup   Follow-up Appointments:   Discharge Instructions: Discharge Instructions   Diet - low sodium heart healthy    Complete by:  As directed      Increase activity slowly    Complete by:  As directed            Consultations:    Procedures Performed:  Ct Angio Chest W/cm &/or Wo Cm  12/29/2013   CLINICAL DATA:  Short of breath.  Left-sided and substernal pain  EXAM: CT ANGIOGRAPHY CHEST WITH CONTRAST  TECHNIQUE: Multidetector CT imaging of the chest was performed using the standard protocol during bolus administration of intravenous contrast. Multiplanar CT image reconstructions and MIPs were obtained to evaluate the vascular anatomy.  CONTRAST:  80mL  OMNIPAQUE IOHEXOL 350 MG/ML SOLN  COMPARISON:  None.  FINDINGS: Mediastinum: Heart size appears normal. There is no pericardial effusion. The trachea appears patent and is midline. The esophagus appears normal. The main pulmonary artery appears normal. No lobar or segmental pulmonary artery filling defects identified to suggest a clinically significant acute pulmonary embolus.  Lungs/Pleura: No pleural effusion identified. There is no airspace consolidation or atelectasis. 4 mm left base nodule is identified, image 82/ series 406.  Upper Abdomen: Calcified granuloma is identified within the liver. No acute findings identified within the upper abdomen.  Musculoskeletal: Review of the visualized osseous structures is significant for mild thoracic spondylosis.  Review of the MIP images confirms the above findings.  IMPRESSION: 1. No evidence for acute pulmonary embolus. 2. 4 mm left base nodule. If the patient is at high risk for bronchogenic carcinoma, follow-up chest CT at 1 year is recommended. If the patient is at low risk, no follow-up is needed. This recommendation follows the consensus statement: Guidelines for Management of Small Pulmonary Nodules Detected on CT Scans: A Statement from the Fleischner Society as published in Radiology 2005; 237:395-400.   Electronically Signed   By: Signa Kellaylor  Stroud M.D.   On: 12/29/2013 09:45    Admission HPI: 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 Patient states his CP started 1 week ago. He states the pain is left-sided, 6/10 in severity, pleuritic, and radiates to his back. The pain was intermittent when it first occurred, but has been constant for the past 3-4 days. He notes his pain was a 10/10 when he came to the ED but improved to 6/10 after receiving Toradol. He notes the pain is very similar to  when he had his PE. Patient states he had an extensive work up for his last PE and believes he was told that it may be hereditary. He states his SOB started about 1 week ago as well. Patient notes he has chronic LLE, but he has noticed that his leg has become even more swollen compared to normal. He states his right leg has also been swollen. He reports diaphoresis, but denies nausea, vomiting. He reports a 30 pack year smoking history and admits to smoking marijuana yesterday that may have been laced with cocaine while he was "at the club."   In the ED, he had a lower extremity doppler which showed an acute DVT in the left peroneal veins. CT Angio Chest was negative for PE.    Hospital Course by problem list: Principal Problem:   Acute DVT (deep venous thrombosis) Active Problems:   Pleuritic chest pain   Dyspnea   Chest pain   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  1. Acute Left Lower Extremity DVT: Patient has history of DVT (2005) and PE (2007) for which he completed 6 months of Lovenox therapy. He presented to the ED with a 1 week history of chest pain and SOB. Patient states this presentation was very similar to the last time he had a PE. On LLE doppler, he was found to have an acute DVT in his left peroneal veins. CT Angio Chest was negative for PE. Patient was started on heparin. An anticoagulation work up was done. Since this was the patient's third DVT event he will need to be on life-long anticoagulation. He has failed coumadin in the past as he was not able to get a therapeutic INR. Patient was transitioned to Xarelto 15 mg BID the next day and discharged home with a prescription for Xarelto for next 20 days at this dose. This will need to be adjusted to Xarelto 20 mg daily on  01/20/14. Patient is uninsured so he will need medication and insurance assistance. He received a card for a free 30-day trial of Xarelto upon discharge. Hypercoagulable workup needs to be followed up at outpatient appointment. Will set up appointment for patient in IM clinic.   2. Chest Pain: Patient presented with 1 week history of chest pain and SOB. He noted CP was left-sided, 6/10 in severity, pleuritic, and radiated to his back. The pain was originally intermittent, but then was constant for the past 3-4 days. He noted his pain was a 10/10 when he came to the ED but improved to 6/10 after receiving Toradol. He noted the pain is very similar to when he had his PE. Troponins negative x 3. EKG showed LVH, minimal ST elevation in V3,  V4. He had no prior for comparison. Although CT Angio Chest was negative for PE, it's possible he had a segmental PE which did not show up on CT and could be source for chest pain. Patient was started on heparin for his LLE DVT, which also treated any possible PE. Patient then transitioned to Xarelto as above.   3. Lung Nodule: On CT Angio Chest, patient noted to have 4 mm left base nodule. Since he is an active smoker with 30+ pack year smoking history, it is recommended that he receive a repeat CT Chest in 9 months to evaluate the nodule.  Discharge Vitals:   BP 100/52  Pulse 45  Temp(Src) 97.5 F (36.4 C) (Oral)  Resp 18  Ht 5\' 7"  (1.702 m)  Wt 149 lb 11.1 oz (67.9 kg)  BMI 23.44 kg/m2  SpO2 100% Physical Exam  General: alert, sitting up in bed, NAD  HEENT: Watts/AT, EOMI, mucus membranes moist  CV: bradycardic, normal S1/S2, no m/g/r Pulm: CTA bilaterally, breaths non-labored  Abd: BS+, soft, non-distended, non-tender  Ext: warm, edema much improved, legs appear symmetric, moves all  Neuro: alert and oriented x 3, CNs II-XII intact, strength intact  Discharge Labs:  Results for orders placed during the hospital encounter of 12/29/13 (from the past 24 hour(s))   TROPONIN I     Status: None   Collection Time    12/29/13  8:33 PM      Result Value Ref Range   Troponin I <0.30  <0.30 ng/mL  TROPONIN I     Status: None   Collection Time    12/30/13  1:19 AM      Result Value Ref Range   Troponin I <0.30  <0.30 ng/mL  HEPARIN LEVEL (UNFRACTIONATED)     Status: None   Collection Time    12/30/13  1:19 AM      Result Value Ref Range   Heparin Unfractionated 0.33  0.30 - 0.70 IU/mL  CBC     Status: Abnormal   Collection Time    12/30/13  1:19 AM      Result Value Ref Range   WBC 5.9  4.0 - 10.5 K/uL   RBC 4.45  4.22 - 5.81 MIL/uL   Hemoglobin 11.5 (*) 13.0 - 17.0 g/dL   HCT 16.1 (*) 09.6 - 04.5 %   MCV 79.8  78.0 - 100.0 fL   MCH 25.8 (*) 26.0 - 34.0 pg   MCHC 32.4  30.0 - 36.0 g/dL   RDW 40.9 (*) 81.1 - 91.4 %   Platelets 208  150 - 400 K/uL  BASIC METABOLIC PANEL     Status: Abnormal   Collection Time    12/30/13  1:19 AM      Result Value Ref Range   Sodium 137  137 - 147 mEq/L   Potassium 3.9  3.7 - 5.3 mEq/L   Chloride 102  96 - 112 mEq/L   CO2 26  19 - 32 mEq/L   Glucose, Bld 111 (*) 70 - 99 mg/dL   BUN 26 (*) 6 - 23 mg/dL   Creatinine, Ser 7.82  0.50 - 1.35 mg/dL   Calcium 8.3 (*) 8.4 - 10.5 mg/dL   GFR calc non Af Amer 71 (*) >90 mL/min   GFR calc Af Amer 82 (*) >90 mL/min   Anion gap 9  5 - 15  HEPARIN LEVEL (UNFRACTIONATED)     Status: None   Collection Time    12/30/13  7:55 AM      Result Value Ref Range   Heparin Unfractionated 0.54  0.30 - 0.70 IU/mL  CBC     Status: Abnormal   Collection Time    12/30/13  7:55 AM      Result Value Ref Range   WBC 5.6  4.0 - 10.5 K/uL   RBC 4.74  4.22 - 5.81 MIL/uL   Hemoglobin 12.2 (*) 13.0 - 17.0 g/dL   HCT 40.936.5 (*) 81.139.0 - 91.452.0 %   MCV 77.0 (*) 78.0 - 100.0 fL   MCH 25.7 (*) 26.0 - 34.0 pg   MCHC 33.4  30.0 - 36.0 g/dL   RDW 78.215.5  95.611.5 - 21.315.5 %   Platelets 205  150 - 400 K/uL    Signed: Rich Numberarly Rivet, MD 12/30/2013, 3:17 PM    Services Ordered on Discharge:  None Equipment Ordered on Discharge: None  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  Requires repeat testing for confirmation.  Initial study (12/29/2013) during acute thrombosis (not on warfarin) reveals total protein C of 45% (normal > 72%).  Would recommend repeat Protein C level at post-hospital discharge visit.  Requires lifelong anticoagulation regardless given recurrent thromboembolic disease.

## 2013-12-30 NOTE — Progress Notes (Signed)
Utilization Review Completed.   Erikka Follmer, RN, BSN Nurse Case Manager  

## 2013-12-30 NOTE — Progress Notes (Signed)
Return to work letter given to pt and he has all his papers. D/c'd now to bus stop in stable condition

## 2013-12-30 NOTE — H&P (Signed)
Internal Medicine Attending Admission Note Date: 12/30/2013  Patient name: Peter Washington Medical record number: [id number] Date of birth: 07/25/1965 Age: 48 y.o. Gender: male  I saw and evaluated the patient. I reviewed the resident's note and I agree with the resident's findings and plan as documented in the resident's note.  Chief Complaint(s): Chest pain  History - key components related to admission:  Peter Washington is a 48 year old man with a history of a left lower extremity deep venous thrombosis in 2005 and pulmonary embolism in 2007 which was treated with 6 months of Lovenox secondary to an inability to become therapeutic on Coumadin who presents to the emergency department complaining of one week of chest pain and shortness of breath. The chest pain is left-sided and is characterized as pleuritic and radiating to the back. It is 6/10 and has been constant for the last 3-4 days. He has associated shortness of breath and diaphoresis but denies any dizziness or nausea. Although his left lower extremity is usually larger than his right lower extremity he has noted that this become even more discrepant recently. Of note, he recently smoked where marijuana which he claims was laced with cocaine. 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 He was therefore admitted to the internal medicine teaching service for further evaluation and care.  Physical Exam - key components related to admission:  Filed Vitals:   12/29/13 1630 12/29/13 1700 12/29/13 2056 12/30/13 0500  BP: 133/81  102/54 100/52  Pulse: 62  58 45  Temp:   97.4 F (36.3 C) 97.5 F (36.4 C)  TempSrc:   Oral Oral  Resp: 15  18 18   Height:  5\' 7"  (1.702 m)    Weight:  149 lb 12.8 oz (67.949 kg)  149 lb 11.1 oz (67.9 kg)  SpO2: 100%  99% 100%   General: Well-developed, well-nourished, man lying flat in bed in no acute distress. Lower  extremities: Very mild left lower extremity pitting edema to the midshin otherwise unremarkable  Lab results:  Basic Metabolic Panel:  Recent Labs  40/98/1110/17/15 0745 12/29/13 0754 12/30/13 0119  NA 138 139 137  K 4.2 4.1 3.9  CL 100 107 102  CO2 24  --  26  GLUCOSE 79 79 111*  BUN 15 15 26*  CREATININE 1.14 1.10 1.17  CALCIUM 9.2  --  8.3*    Recent Labs  12/29/13 1509  LIPASE 47   CBC:  Recent Labs  12/30/13 0119 12/30/13 0755  WBC 5.9 5.6  HGB 11.5* 12.2*  HCT 35.5* 36.5*  MCV 79.8 77.0*  PLT 208 205   Cardiac Enzymes:  Recent Labs  12/29/13 1509 12/29/13 2033 12/30/13 0119  TROPONINI <0.30 <0.30 <0.30   Coagulation:  Recent Labs  12/29/13 1304  INR 1.13   Urine Drug Screen:  Positive for opiates and cocaine  Imaging results:  Ct Angio Chest W/cm &/or Wo Cm  12/29/2013   CLINICAL DATA:  Short of breath.  Left-sided and substernal pain  EXAM: CT ANGIOGRAPHY CHEST WITH CONTRAST  TECHNIQUE: Multidetector CT imaging of the chest was performed using the standard protocol during bolus administration of intravenous contrast. Multiplanar CT image reconstructions and MIPs were obtained to evaluate the vascular anatomy.  CONTRAST:  80mL OMNIPAQUE IOHEXOL 350 MG/ML SOLN  COMPARISON:  None.  FINDINGS: Mediastinum: Heart size appears normal. There is no pericardial effusion. The trachea appears patent  and is midline. The esophagus appears normal. The main pulmonary artery appears normal. No lobar or segmental pulmonary artery filling defects identified to suggest a clinically significant acute pulmonary embolus.  Lungs/Pleura: No pleural effusion identified. There is no airspace consolidation or atelectasis. 4 mm left base nodule is identified, image 82/ series 406.  Upper Abdomen: Calcified granuloma is identified within the liver. No acute findings identified within the upper abdomen.  Musculoskeletal: Review of the visualized osseous structures is significant for mild  thoracic spondylosis.  Review of the MIP images confirms the above findings.  IMPRESSION: 1. No evidence for acute pulmonary embolus. 2. 4 mm left base nodule. If the patient is at high risk for bronchogenic carcinoma, follow-up chest CT at 1 year is recommended. If the patient is at low risk, no follow-up is needed. This recommendation follows the consensus statement: Guidelines for Management of Small Pulmonary Nodules Detected on CT Scans: A Statement from the Fleischner Society as published in Radiology 2005; 237:395-400.   Electronically Signed   By: Signa Kellaylor  Stroud M.D.   On: 12/29/2013 09:45   Other results:  LLE Ultrasound: Acute on chronic DVT  EKG: Normal sinus rhythm at 78 beats per minute, normal axis, normal intervals, no LVH by voltage, Q waves in V1 and V2, good R wave progression, no ST elevation, nonspecific T-wave flattening throughout.  Assessment & Plan by Problem:  Peter Washington is a 48 year old man with a history of recurrent thromboembolic disease who was unable to become therapeutic on Coumadin and who presents with one week of pleuritic chest pain and worsening lower extremity edema. He was found to have an acute deep venous thrombosis in the left lower extremity. Although the CT angiogram was negative for PE he may have suffered a subsegmental pulmonary embolism as the cause of his pleuritic chest pain. Despite the use of cocaine, he has ruled out for a myocardial infarction as a cause of the left-sided chest pain.   1) Recurrent LLE thromboembolic disease: Since he was unable to become therapeutic on Coumadin in the past we will attempt to help him obtain Lesia HausenXaralto for his anticoagulation needs which are recommended to be lifelong.  2) Disposition: He is stable for discharge home today if we can obtain appropriate anticoagulation. He will be given a list of primary care providers in the community who are open to self-pay patients. He should establish with one of these providers as  soon as possible.

## 2013-12-30 NOTE — Progress Notes (Signed)
Subjective: Patient reports his chest pain and SOB have improved, but are still present. Patient was started on heparin yesterday for his LLE DVT.  Will talk with pharmacy about transitioning him to Xarelto today. Since patient is uninsured will need to see if social work can help with medication costs.  Objective: Vital signs in last 24 hours: Filed Vitals:   12/29/13 1630 12/29/13 1700 12/29/13 2056 12/30/13 0500  BP: 133/81  102/54 100/52  Pulse: 62  58 45  Temp:   97.4 F (36.3 C) 97.5 F (36.4 C)  TempSrc:   Oral Oral  Resp: 15  18 18   Height:  5\' 7"  (1.702 m)    Weight:  149 lb 12.8 oz (67.949 kg)  149 lb 11.1 oz (67.9 kg)  SpO2: 100%  99% 100%   Weight change:   Intake/Output Summary (Last 24 hours) at 12/30/13 1003 Last data filed at 12/30/13 0730  Gross per 24 hour  Intake      0 ml  Output   1350 ml  Net  -1350 ml   Physical Exam General: alert, sitting up in bed, NAD HEENT: [place]/AT, EOMI, mucus membranes moist CV: bradycardic, normal S1/S2, no m/g/r Pulm: CTA bilaterally, breaths non-labored Abd: BS+, soft, non-distended, non-tender Ext: warm, edema much improved, legs appear symmetric, moves all Neuro: alert and oriented x 3, CNs II-XII intact, strength intact  Lab Results: Basic Metabolic Panel:  Recent Labs Lab 12/29/13 0745 12/29/13 0754 12/30/13 0119  NA 138 139 137  K 4.2 4.1 3.9  CL 100 107 102  CO2 24  --  26  GLUCOSE 79 79 111*  BUN 15 15 26*  CREATININE 1.14 1.10 1.17  CALCIUM 9.2  --  8.3*   Liver Function Tests: No results found for this basename: AST, ALT, ALKPHOS, BILITOT, PROT, ALBUMIN,  in the last 168 hours  Recent Labs Lab 12/29/13 1509  LIPASE 47   No results found for this basename: AMMONIA,  in the last 168 hours CBC:  Recent Labs Lab 12/30/13 0119 12/30/13 0755  WBC 5.9 5.6  HGB 11.5* 12.2*  HCT 35.5* 36.5*  MCV 79.8 77.0*  PLT 208 205   Cardiac Enzymes:  Recent Labs Lab 12/29/13 1509 12/29/13 2033  12/30/13 0119  TROPONINI <0.30 <0.30 <0.30   BNP: No results found for this basename: PROBNP,  in the last 168 hours D-Dimer: No results found for this basename: DDIMER,  in the last 168 hours CBG: No results found for this basename: GLUCAP,  in the last 168 hours Hemoglobin A1C: No results found for this basename: HGBA1C,  in the last 168 hours Fasting Lipid Panel: No results found for this basename: CHOL, HDL, LDLCALC, TRIG, CHOLHDL, LDLDIRECT,  in the last 168 hours Thyroid Function Tests: No results found for this basename: TSH, T4TOTAL, FREET4, T3FREE, THYROIDAB,  in the last 168 hours Coagulation:  Recent Labs Lab 12/29/13 1304  LABPROT 14.6  INR 1.13   Anemia Panel: No results found for this basename: VITAMINB12, FOLATE, FERRITIN, TIBC, IRON, RETICCTPCT,  in the last 168 hours Urine Drug Screen: Drugs of Abuse     Component Value Date/Time   LABOPIA POSITIVE* 12/29/2013 0930   COCAINSCRNUR POSITIVE* 12/29/2013 0930   LABBENZ NONE DETECTED 12/29/2013 0930   AMPHETMU NONE DETECTED 12/29/2013 0930   THCU NONE DETECTED 12/29/2013 0930   LABBARB NONE DETECTED 12/29/2013 0930    Alcohol Level: No results found for this basename: ETH,  in the last 168 hours  Urinalysis: No results found for this basename: COLORURINE, APPERANCEUR, LABSPEC, PHURINE, GLUCOSEU, HGBUR, BILIRUBINUR, KETONESUR, PROTEINUR, UROBILINOGEN, NITRITE, LEUKOCYTESUR,  in the last 168 hours   Micro Results: No results found for this or any previous visit (from the past 240 hour(s)). Studies/Results: Ct Angio Chest W/cm &/or Wo Cm  12/29/2013   CLINICAL DATA:  Short of breath.  Left-sided and substernal pain  EXAM: CT ANGIOGRAPHY CHEST WITH CONTRAST  TECHNIQUE: Multidetector CT imaging of the chest was performed using the standard protocol during bolus administration of intravenous contrast. Multiplanar CT image reconstructions and MIPs were obtained to evaluate the vascular anatomy.  CONTRAST:  80mL  OMNIPAQUE IOHEXOL 350 MG/ML SOLN  COMPARISON:  None.  FINDINGS: Mediastinum: Heart size appears normal. There is no pericardial effusion. The trachea appears patent and is midline. The esophagus appears normal. The main pulmonary artery appears normal. No lobar or segmental pulmonary artery filling defects identified to suggest a clinically significant acute pulmonary embolus.  Lungs/Pleura: No pleural effusion identified. There is no airspace consolidation or atelectasis. 4 mm left base nodule is identified, image 82/ series 406.  Upper Abdomen: Calcified granuloma is identified within the liver. No acute findings identified within the upper abdomen.  Musculoskeletal: Review of the visualized osseous structures is significant for mild thoracic spondylosis.  Review of the MIP images confirms the above findings.  IMPRESSION: 1. No evidence for acute pulmonary embolus. 2. 4 mm left base nodule. If the patient is at high risk for bronchogenic carcinoma, follow-up chest CT at 1 year is recommended. If the patient is at low risk, no follow-up is needed. This recommendation follows the consensus statement: Guidelines for Management of Small Pulmonary Nodules Detected on CT Scans: A Statement from the Fleischner Society as published in Radiology 2005; 237:395-400.   Electronically Signed   By: Signa Kellaylor  Stroud M.D.   On: 12/29/2013 09:45   Medications: I have reviewed the patient's current medications. Scheduled Meds: . sodium chloride  3 mL Intravenous Q12H   Continuous Infusions: . 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   1. Acute Left Lower Extremity DVT: Patient was started on heparin last night. Patient still complaining of mild  chest pain and SOB this morning. Suspect that even though CT Angio Chest was negative for PE, that he may have a small segmental PE that was missed which is causing his chest pain. Patient is on proper treatment for DVT and possible PE. Will transition to Xarelto today. Patient does not have insurance, so will consult case manager to help patient with some options for getting this medication long-term. Pharmacy to educate patient on risks/benefits of Xarelto. - Consult pharmacy for Xarelto education - Consult case manager for options for Xarelto, may qualify for Medicaid according to CM - Set up patient in IM clinic - Protein C and S --> pending - Lupus anticoagulant --> pending - Antithrombin III normal - Beta-2-glycoprotein --> pending  - Homocysteine normal - Factor V Leiden --> pending - Cardiolipin --> pending - Prothrombin gene mutation --> pending  2. Chest Pain: Patient still with mild chest pain and SOB this morning. Troponins negative x 3. Repeat EKG unchanged. Lipase normal. Although CT Angio Chest negative for PE, possible that  patient has small segmental PE that was missed by CT which is the cause of his pain. Patient is on appropriate treatment for DVT/PE. Will transition to Xarelto today as above.  - Cardiac monitoring  - Tylenol for pain   3. Lung Nodule: On CT Angio Chest, patient noted to have 4 mm left base nodule. Since he is an active smoker with 30+ pack year smoking history, recommended that he receive repeat CT Chest in 9 months. Will set up for him before discharge.   Diet: Regular  DVT/PE PPx: Heparin --> Xarelto Dispo: Discharge likely today  The patient does not have a current PCP (No Pcp Per Patient) and does need an [facility] hospital follow-up appointment after discharge.  The patient does not have transportation limitations that hinder transportation to clinic appointments.  .Services Needed at time of discharge: Y = Yes, Blank = No PT:   OT:   RN:     Equipment:   Other:     LOS: 1 day   Rich Number, MD 12/30/2013, 10:03 AM

## 2013-12-30 NOTE — Progress Notes (Signed)
ANTICOAGULATION CONSULT NOTE - Follow Up Consult  Pharmacy Consult for Heparin  Indication: DVT  No Known Allergies  Patient Measurements: Height: 5\' 7"  (170.2 cm) Weight: 149 lb 12.8 oz (67.949 kg) IBW/kg (Calculated) : 66.1  Vital Signs: Temp: 97.4 F (36.3 C) (10/17 2056) Temp Source: Oral (10/17 2056) BP: 102/54 mmHg (10/17 2056) Pulse Rate: 58 (10/17 2056)  Labs:  Recent Labs  12/29/13 0745 12/29/13 0754 12/29/13 1304 12/29/13 1509 12/29/13 2033 12/30/13 0119  HGB 12.5* 14.6  --   --   --  11.5*  HCT 37.4* 43.0  --   --   --  35.5*  PLT 219  --   --   --   --  208  APTT  --   --  29  --   --   --   LABPROT  --   --  14.6  --   --   --   INR  --   --  1.13  --   --   --   HEPARINUNFRC  --   --   --   --   --  0.33  CREATININE 1.14 1.10  --   --   --   --   TROPONINI  --   --   --  <0.30 <0.30  --     Estimated Creatinine Clearance: 75.1 ml/min (by C-G formula based on Cr of 1.1).   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 Noted to have new acute LLE DVT. First HL is 0.33. Other labs as above.   Goal of Therapy:  Heparin level 0.3-0.7 units/ml Monitor platelets by anticoagulation protocol: Yes   Plan:  -Increase slightly to 1200 units/hr to prevent sub-therapeutic level given acute DVT -0800 HL -Daily CBC/HL -Monitor for bleeding -F/U oral anticoagulation   Abran DukeLedford, Ashish Rossetti 12/30/2013,1:50 AM

## 2013-12-30 NOTE — ED Provider Notes (Signed)
Medical screening examination/treatment/procedure(s) were conducted as a shared visit with non-physician practitioner(s) and myself.  I personally evaluated the patient during the encounter.   EKG Interpretation   Date/Time:  Saturday December 29 2013 07:55:53 EDT Ventricular Rate:  78 PR Interval:  142 QRS Duration: 78 QT Interval:  426 QTC Calculation: 485 R Axis:   62 Text Interpretation:  Sinus rhythm Consider left ventricular hypertrophy  Anterior Q waves, possibly due to LVH nonspecific ST changes, no priors  for comparison Confirmed by [facility] ClinicWOFFORD  MD, TREY ([id number]) on 12/29/2013  11:49:13 AM        Peter Foresterrey Joeangel Jeanpaul, MD 12/30/13 [phone]

## 2013-12-30 NOTE — Progress Notes (Addendum)
ANTICOAGULATION CONSULT NOTE - Initial Consult  Pharmacy Consult for Heparin Indication: DVT  No Known Allergies  Patient Measurements: Height: 5\' 7"  (170.2 cm) Weight: 149 lb 11.1 oz (67.9 kg) IBW/kg (Calculated) : 66.1 Heparin Dosing Weight: 68 kg  Vital Signs: Temp: 97.5 F (36.4 C) (10/18 0500) Temp Source: Oral (10/18 0500) BP: 100/52 mmHg (10/18 0500) Pulse Rate: 45 (10/18 0500)  Labs:  Recent Labs  12/29/13 0745 12/29/13 0754 12/29/13 1304 12/29/13 1509 12/29/13 2033 12/30/13 0119 12/30/13 0755  HGB 12.5* 14.6  --   --   --  11.5* 12.2*  HCT 37.4* 43.0  --   --   --  35.5* 36.5*  PLT 219  --   --   --   --  208 205  APTT  --   --  29  --   --   --   --   LABPROT  --   --  14.6  --   --   --   --   INR  --   --  1.13  --   --   --   --   HEPARINUNFRC  --   --   --   --   --  0.33 0.54  CREATININE 1.14 1.10  --   --   --  1.17  --   TROPONINI  --   --   --  <0.30 <0.30 <0.30  --     Estimated Creatinine Clearance: 70.6 ml/min (by C-G formula based on Cr of 1.17).   Medical History: Past Medical History  Diagnosis Date  . PE (pulmonary embolism)   . 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 CTA was negative for PE however the patient was noted to have another acute LLE DVT and pharmacy was consulted to start heparin for anticoagulation.  Heparin level was therapeutic x1 this morning, CBC is stable.  Goal of Therapy:  Heparin level 0.3-0.7 units/ml Monitor platelets by anticoagulation protocol: Yes   Plan:  1. Continue heparin drip at a rate of 1200 units/hr 2. Daily heparin level + CBC 3. F/u plans to transition to oral anticoagulation 4. Repeat confirmatory HL this afternoon    Agapito GamesAlison Loveah Like, PharmD, BCPS Clinical Pharmacist Pager: [phone] 12/30/2013 9:45 AM    Addendum -Transitioning to Xarelto -Stop heparin gtt at the same  time the first Xarelto dose is given -Monitor CBC q72h -15 mg po bid x 21 days followed by 20 mg po daily   Gaila Engebretsen, Darl HouseholderAlison M

## 2013-12-31 ENCOUNTER — Encounter: Payer: Self-pay | Admitting: Internal Medicine

## 2013-12-31 ENCOUNTER — Telehealth: Payer: Self-pay | Admitting: Internal Medicine

## 2013-12-31 DIAGNOSIS — D6859 Other primary thrombophilia: Secondary | ICD-10-CM | POA: Insufficient documentation

## 2013-12-31 LAB — BETA-2-GLYCOPROTEIN I ABS, IGG/M/A
BETA-2-GLYCOPROTEIN I IGA: 8 A Units (ref <20)
BETA-2-GLYCOPROTEIN I IGM: 13 M Units (ref <20)
Beta-2 Glyco I IgG: 0 G Units (ref <20)

## 2013-12-31 LAB — LUPUS ANTICOAGULANT PANEL
DRVVT: 29.8 secs (ref <42.9)
Lupus Anticoagulant: NOT DETECTED
PTT LA: 35.3 s (ref 28.0–43.0)

## 2013-12-31 LAB — PROTEIN C ACTIVITY: Protein C Activity: 28 % — ABNORMAL LOW (ref 75–133)

## 2013-12-31 LAB — CARDIOLIPIN ANTIBODIES, IGG, IGM, IGA
ANTICARDIOLIPIN IGA: 8 U/mL — AB (ref <22)
ANTICARDIOLIPIN IGG: 5 GPL U/mL — AB (ref <23)
Anticardiolipin IgM: 1 MPL U/mL — ABNORMAL LOW (ref <11)

## 2013-12-31 LAB — PROTEIN C, TOTAL: PROTEIN C, TOTAL: 45 % — AB (ref 72–160)

## 2013-12-31 LAB — PROTEIN S ACTIVITY: Protein S Activity: 91 % (ref 69–129)

## 2013-12-31 LAB — PROTEIN S, TOTAL: PROTEIN S AG TOTAL: 77 % (ref 60–150)

## 2013-12-31 NOTE — Telephone Encounter (Signed)
Talked with patient's Peter Washington, Mary Mims, about patient's follow up appointment. She stated she would let him know about his appointment via facebook.

## 2013-12-31 NOTE — Telephone Encounter (Signed)
Left message on voicemail about f/u appt on Oct 27th at 3:15 PM with Dr. Delane GingerGill. Will try calling again.

## 2014-01-02 LAB — PROTHROMBIN GENE MUTATION

## 2014-01-02 LAB — FACTOR 5 LEIDEN

## 2014-01-08 ENCOUNTER — Ambulatory Visit: Payer: Self-pay | Admitting: Internal Medicine

## 2014-01-08 ENCOUNTER — Encounter: Payer: Self-pay | Admitting: General Practice

## 2014-01-15 ENCOUNTER — Emergency Department (HOSPITAL_COMMUNITY): Payer: Self-pay

## 2014-01-15 ENCOUNTER — Inpatient Hospital Stay (HOSPITAL_COMMUNITY)
Admission: EM | Admit: 2014-01-15 | Discharge: 2014-01-17 | DRG: 176 | Disposition: A | Discharge status: Home / Self Care | Payer: Self-pay | Attending: Oncology | Admitting: Oncology

## 2014-01-15 ENCOUNTER — Encounter (HOSPITAL_COMMUNITY): Payer: Self-pay | Admitting: *Deleted

## 2014-01-15 DIAGNOSIS — Z86718 Personal history of other venous thrombosis and embolism: Secondary | ICD-10-CM

## 2014-01-15 DIAGNOSIS — I2699 Other pulmonary embolism without acute cor pulmonale: Principal | ICD-10-CM | POA: Diagnosis present

## 2014-01-15 DIAGNOSIS — D509 Iron deficiency anemia, unspecified: Secondary | ICD-10-CM | POA: Diagnosis present

## 2014-01-15 DIAGNOSIS — D6859 Other primary thrombophilia: Secondary | ICD-10-CM | POA: Diagnosis present

## 2014-01-15 DIAGNOSIS — R0600 Dyspnea, unspecified: Secondary | ICD-10-CM

## 2014-01-15 DIAGNOSIS — F17210 Nicotine dependence, cigarettes, uncomplicated: Secondary | ICD-10-CM | POA: Diagnosis present

## 2014-01-15 DIAGNOSIS — Z86711 Personal history of pulmonary embolism: Secondary | ICD-10-CM

## 2014-01-15 DIAGNOSIS — Z9119 Patient's noncompliance with other medical treatment and regimen: Secondary | ICD-10-CM | POA: Diagnosis present

## 2014-01-15 DIAGNOSIS — Z720 Tobacco use: Secondary | ICD-10-CM

## 2014-01-15 DIAGNOSIS — F1410 Cocaine abuse, uncomplicated: Secondary | ICD-10-CM | POA: Diagnosis present

## 2014-01-15 LAB — BASIC METABOLIC PANEL
Anion gap: 12 (ref 5–15)
BUN: 11 mg/dL (ref 6–23)
CHLORIDE: 106 meq/L (ref 96–112)
CO2: 26 mEq/L (ref 19–32)
CREATININE: 1.11 mg/dL (ref 0.50–1.35)
Calcium: 9 mg/dL (ref 8.4–10.5)
GFR calc non Af Amer: 76 mL/min — ABNORMAL LOW (ref >90)
GFR, EST AFRICAN AMERICAN: 88 mL/min — AB (ref >90)
Glucose, Bld: 102 mg/dL — ABNORMAL HIGH (ref 70–99)
Potassium: 4.2 mEq/L (ref 3.7–5.3)
Sodium: 144 mEq/L (ref 137–147)

## 2014-01-15 LAB — CBC
HCT: 37.3 % — ABNORMAL LOW (ref 39.0–52.0)
Hemoglobin: 12.4 g/dL — ABNORMAL LOW (ref 13.0–17.0)
MCH: 25.8 pg — ABNORMAL LOW (ref 26.0–34.0)
MCHC: 33.2 g/dL (ref 30.0–36.0)
MCV: 77.5 fL — AB (ref 78.0–100.0)
PLATELETS: 185 10*3/uL (ref 150–400)
RBC: 4.81 MIL/uL (ref 4.22–5.81)
RDW: 15.7 % — AB (ref 11.5–15.5)
WBC: 7.3 10*3/uL (ref 4.0–10.5)

## 2014-01-15 LAB — APTT: APTT: 32 s (ref 24–37)

## 2014-01-15 LAB — PROTIME-INR
INR: 1.07 (ref 0.00–1.49)
Prothrombin Time: 14 seconds (ref 11.6–15.2)

## 2014-01-15 LAB — I-STAT TROPONIN, ED: Troponin i, poc: 0.01 ng/mL (ref 0.00–0.08)

## 2014-01-15 MED ORDER — SODIUM CHLORIDE 0.9 % IV BOLUS (SEPSIS)
1000.0000 mL | Freq: Once | INTRAVENOUS | Status: AC
  Administered 2014-01-15: 1000 mL via INTRAVENOUS

## 2014-01-15 MED ORDER — MORPHINE SULFATE 4 MG/ML IJ SOLN
4.0000 mg | Freq: Once | INTRAMUSCULAR | Status: AC
Start: 2014-01-15 — End: 2014-01-15
  Administered 2014-01-15: 4 mg via INTRAVENOUS
  Filled 2014-01-15: qty 1

## 2014-01-15 NOTE — ED Provider Notes (Signed)
CSN: [id number]     Arrival date & time 01/15/14  2050 History   First MD Initiated Contact with Patient 01/15/14 2145     Chief Complaint  Patient presents with  . Shortness of Breath     (Consider location/radiation/quality/duration/timing/severity/associated sxs/prior Treatment) HPI Comments: The patient is a 48 year old male with previous history of DVT 2005, October 2015, PE 2007 presenting to the emergency department the chief complaint of left-sided chest discomfort ongoing for 3 days. Patient describes chest discomfort as pain (unable to elaborate on characteristic), left-sided, non radiating worsened with deep inspiration and body position. He also reports associated shortness of breath. Denies cough. Patient reports increase in swelling to left lower extremity, leg with known DVT. Reports discomfort in upper quadricepts as well. Patient reports noncompliance with his Xarelto due to unable to afford medication, reports pharmacy did not take free card. No anticoagulation since discharge, 10/18. He denies fever. He denies cocaine use, IV drug use.  Patient is a 48 y.o. male presenting with shortness of breath. The history is provided by the patient and medical records. No language interpreter was used.  Shortness of Breath Associated symptoms: chest pain   Associated symptoms: no abdominal pain, no cough, no fever and no vomiting     Past Medical History  Diagnosis Date  . PE (pulmonary embolism)   . DVT of lower extremity (deep venous thrombosis)    History reviewed. No pertinent past surgical history. History reviewed. No pertinent family history. History  Substance Use Topics  . Smoking status: Current Every Day Smoker  . Smokeless tobacco: Not on file  . Alcohol Use: Yes    Review of Systems  Constitutional: Negative for fever and chills.  Respiratory: Positive for chest tightness and shortness of breath. Negative for cough.   Cardiovascular: Positive for chest pain and  leg swelling.  Gastrointestinal: Negative for nausea, vomiting, abdominal pain and diarrhea.      Allergies  Review of patient's allergies indicates no known allergies.  Home Medications   Prior to Admission medications   Medication Sig Start Date End Date Taking? Authorizing Provider  Rivaroxaban (XARELTO) 15 MG TABS tablet Take 1 tablet (15 mg total) by mouth 2 (two) times daily. 12/30/13   Carly Rivet, MD   BP 114/56 mmHg  Pulse 62  Temp(Src) 97.4 F (36.3 C) (Oral)  Resp 20  SpO2 100% Physical Exam  Constitutional: He is oriented to person, place, and time. He appears well-developed and well-nourished. No distress.  HENT:  Head: Normocephalic and atraumatic.  Neck: Neck supple.  Cardiovascular: Normal rate and regular rhythm.   No murmur heard. Pulses:      Radial pulses are 2+ on the right side, and 2+ on the left side.  Trace pitting edema to right lower extremity, 1+ pitting edema to left lower extremity. No calf tenderness.  Pulmonary/Chest: Effort normal. He has no wheezes. He has no rales.  Patient is able to speak in complete sentences.   Abdominal: Soft. Bowel sounds are normal. He exhibits no distension. There is no tenderness. There is no rebound.  Musculoskeletal: Normal range of motion.  Neurological: He is alert and oriented to person, place, and time.  Skin: Skin is warm. He is not diaphoretic.  Psychiatric: He has a normal mood and affect. His behavior is normal.  Nursing note and vitals reviewed.   ED Course  Procedures (including critical care time) Labs Review Labs Reviewed  CBC - Abnormal; Notable for the following:  Hemoglobin 12.4 (*)    HCT 37.3 (*)    MCV 77.5 (*)    MCH 25.8 (*)    RDW 15.7 (*)    All other components within normal limits  BASIC METABOLIC PANEL - Abnormal; Notable for the following:    Glucose, Bld 102 (*)    GFR calc non Af Amer 76 (*)    GFR calc Af Amer 88 (*)    All other components within normal limits   PROTIME-INR  APTT  URINE RAPID DRUG SCREEN (HOSP PERFORMED)  I-STAT TROPOININ, ED  Rosezena SensorI-STAT TROPOININ, ED    Imaging Review Dg Chest 2 View  01/15/2014   CLINICAL DATA:  Anterior left-sided chest pain for 2 days.  EXAM: CHEST  2 VIEW  COMPARISON:  04/19/2008  FINDINGS: The heart size and mediastinal contours are within normal limits. Both lungs are clear. The visualized skeletal structures are unremarkable.  IMPRESSION: No active cardiopulmonary disease.   Electronically Signed   By: Burman NievesWilliam  Stevens M.D.   On: 01/15/2014 22:53     EKG Interpretation   Date/Time:  Tuesday January 15 2014 21:04:12 EST Ventricular Rate:  68 PR Interval:  130 QRS Duration: 88 QT Interval:  416 QTC Calculation: 442 R Axis:   89 Text Interpretation:  Normal sinus rhythm T wave abnormality, consider  inferolateral ischemia Abnormal ECG No significant change was found  Confirmed by CAMPOS  MD, Caryn BeeKEVIN ([id number]) on 01/15/2014 9:10:44 PM      MDM   Final diagnoses:  Dyspnea  History of DVT (deep vein thrombosis)   The patient presents with chest pain with known DVT, not on anticoagulation. Patient is speaking in complete sentences in no acute distress oxygen saturation 97%-100% RA. EKG without changes from prior. Troponin negative CBC shows hemoglobin 12.4.  BMP without concerning abnormalities. Negative chest x-ray. 0025: Reevaluation patient resting complain room watching TV. Discussed negative workup thus far, awaiting CT-A to rule out PE. 1:08 AM Pt care assumed by Manus RuddSchulz, NP at shift change. Awaiting CT results.    Mellody DrownLauren Rian Busche, PA-C 01/16/14 1517  Arby BarretteMarcy Pfeiffer, MD 01/21/14 [phone]

## 2014-01-15 NOTE — ED Notes (Signed)
Pt. Transported to xraY

## 2014-01-15 NOTE — ED Notes (Signed)
Patient off the unit for testing.

## 2014-01-15 NOTE — ED Notes (Addendum)
Pt in c/o shortness of breath for the last two days, worse with exertion, was dx a few weeks ago with a DVT in his left leg, concerned that it may have moved, c/o chest pain, also nausea- pt has not been complaint with his Xarelto, states he was unable to get the medication due to cost

## 2014-01-16 ENCOUNTER — Encounter (HOSPITAL_COMMUNITY): Payer: Self-pay | Admitting: Radiology

## 2014-01-16 ENCOUNTER — Emergency Department (HOSPITAL_COMMUNITY): Payer: Self-pay

## 2014-01-16 DIAGNOSIS — I8290 Acute embolism and thrombosis of unspecified vein: Secondary | ICD-10-CM

## 2014-01-16 DIAGNOSIS — Z86711 Personal history of pulmonary embolism: Secondary | ICD-10-CM

## 2014-01-16 DIAGNOSIS — Z720 Tobacco use: Secondary | ICD-10-CM

## 2014-01-16 DIAGNOSIS — F1910 Other psychoactive substance abuse, uncomplicated: Secondary | ICD-10-CM

## 2014-01-16 DIAGNOSIS — I2699 Other pulmonary embolism without acute cor pulmonale: Secondary | ICD-10-CM | POA: Diagnosis present

## 2014-01-16 LAB — D-DIMER, QUANTITATIVE (NOT AT ARMC): D-Dimer, Quant: 5.52 ug/mL-FEU — ABNORMAL HIGH (ref 0.00–0.48)

## 2014-01-16 LAB — COMPREHENSIVE METABOLIC PANEL
ALT: 19 U/L (ref 0–53)
AST: 20 U/L (ref 0–37)
Albumin: 3 g/dL — ABNORMAL LOW (ref 3.5–5.2)
Alkaline Phosphatase: 74 U/L (ref 39–117)
Anion gap: 10 (ref 5–15)
BUN: 11 mg/dL (ref 6–23)
CO2: 26 meq/L (ref 19–32)
Calcium: 8.2 mg/dL — ABNORMAL LOW (ref 8.4–10.5)
Chloride: 107 mEq/L (ref 96–112)
Creatinine, Ser: 1.09 mg/dL (ref 0.50–1.35)
GFR calc non Af Amer: 77 mL/min — ABNORMAL LOW (ref >90)
GFR, EST AFRICAN AMERICAN: 90 mL/min — AB (ref >90)
Glucose, Bld: 88 mg/dL (ref 70–99)
POTASSIUM: 3.8 meq/L (ref 3.7–5.3)
Sodium: 143 mEq/L (ref 137–147)
TOTAL PROTEIN: 6.1 g/dL (ref 6.0–8.3)
Total Bilirubin: 0.4 mg/dL (ref 0.3–1.2)

## 2014-01-16 LAB — HEPARIN LEVEL (UNFRACTIONATED): HEPARIN UNFRACTIONATED: 0.42 [IU]/mL (ref 0.30–0.70)

## 2014-01-16 LAB — RAPID URINE DRUG SCREEN, HOSP PERFORMED
AMPHETAMINES: NOT DETECTED
BARBITURATES: NOT DETECTED
Benzodiazepines: NOT DETECTED
Cocaine: POSITIVE — AB
OPIATES: POSITIVE — AB
TETRAHYDROCANNABINOL: NOT DETECTED

## 2014-01-16 LAB — CBC
HCT: 35.9 % — ABNORMAL LOW (ref 39.0–52.0)
HEMOGLOBIN: 11.8 g/dL — AB (ref 13.0–17.0)
MCH: 25.9 pg — AB (ref 26.0–34.0)
MCHC: 32.9 g/dL (ref 30.0–36.0)
MCV: 78.7 fL (ref 78.0–100.0)
Platelets: 171 10*3/uL (ref 150–400)
RBC: 4.56 MIL/uL (ref 4.22–5.81)
RDW: 16.1 % — ABNORMAL HIGH (ref 11.5–15.5)
WBC: 6.6 10*3/uL (ref 4.0–10.5)

## 2014-01-16 LAB — I-STAT TROPONIN, ED: Troponin i, poc: 0.01 ng/mL (ref 0.00–0.08)

## 2014-01-16 LAB — TROPONIN I
Troponin I: <0.3 ng/mL (ref <0.30)
Troponin I: <0.3 ng/mL (ref <0.30)
Troponin I: <0.3 ng/mL (ref <0.30)

## 2014-01-16 MED ORDER — ACETAMINOPHEN 325 MG PO TABS
650.0000 mg | ORAL_TABLET | Freq: Once | ORAL | Status: AC
  Administered 2014-01-17: 650 mg via ORAL
  Filled 2014-01-16: qty 2

## 2014-01-16 MED ORDER — MORPHINE SULFATE 2 MG/ML IJ SOLN
2.0000 mg | Freq: Once | INTRAMUSCULAR | Status: AC
  Administered 2014-01-16: 2 mg via INTRAVENOUS
  Filled 2014-01-16: qty 1

## 2014-01-16 MED ORDER — HEPARIN (PORCINE) IN NACL 100-0.45 UNIT/ML-% IJ SOLN
1200.0000 [IU]/h | INTRAMUSCULAR | Status: DC
  Administered 2014-01-16: 1200 [IU]/h via INTRAVENOUS
  Filled 2014-01-16: qty 250

## 2014-01-16 MED ORDER — RIVAROXABAN 15 MG PO TABS
15.0000 mg | ORAL_TABLET | Freq: Two times a day (BID) | ORAL | Status: DC
  Administered 2014-01-16 – 2014-01-17 (×4): 15 mg via ORAL
  Filled 2014-01-16 (×6): qty 1

## 2014-01-16 MED ORDER — HEPARIN BOLUS VIA INFUSION
4000.0000 [IU] | Freq: Once | INTRAVENOUS | Status: AC
  Administered 2014-01-16: 4000 [IU] via INTRAVENOUS
  Filled 2014-01-16: qty 4000

## 2014-01-16 MED ORDER — MORPHINE SULFATE 4 MG/ML IJ SOLN
4.0000 mg | Freq: Once | INTRAMUSCULAR | Status: AC
Start: 2014-01-16 — End: 2014-01-16
  Administered 2014-01-16: 4 mg via INTRAVENOUS
  Filled 2014-01-16: qty 1

## 2014-01-16 MED ORDER — IOHEXOL 350 MG/ML SOLN
100.0000 mL | Freq: Once | INTRAVENOUS | Status: AC | PRN
  Administered 2014-01-16: 100 mL via INTRAVENOUS

## 2014-01-16 MED ORDER — SODIUM CHLORIDE 0.9 % IJ SOLN
3.0000 mL | Freq: Two times a day (BID) | INTRAMUSCULAR | Status: DC
  Administered 2014-01-16 – 2014-01-17 (×2): 3 mL via INTRAVENOUS

## 2014-01-16 NOTE — Plan of Care (Signed)
Problem: Phase I Progression Outcomes Goal: Tolerating diet Outcome: Completed/Met Date Met:  01/16/14 Goal: Voiding-avoid urinary catheter unless indicated Outcome: Completed/Met Date Met:  01/16/14 Goal: Hemodynamically stable Outcome: Completed/Met Date Met:  01/16/14

## 2014-01-16 NOTE — ED Notes (Signed)
Admitting at bedside 

## 2014-01-16 NOTE — H&P (Signed)
Date: 01/16/2014               Patient Name:  Peter Washington MRN: [id number]  DOB: 08/27/1965 Age / Sex: 48 y.o., male   PCP: No Pcp Per Patient         Medical Service: Internal Medicine Teaching Service         Attending Physician: Dr. Levert FeinsteinJames M Granfortuna, MD    First Contact: Dr. Danella Pentonruong Pager: [phone]  Second Contact: Dr. Mariea ClontsEmokpae Pager: [phone]       After Hours (After 5p/  First Contact Pager: [phone]  weekends / holidays): Second Contact Pager: [phone]   Chief Complaint: chest pain, dyspnea  History of Present Illness:   Patient is a 48 year old gentleman with a previous history of DVT in 2005, October 2015, and pulmonary embolism in 2007 who presents with chest pain and shortness of breath. Patient states that his symptoms started several days ago. He first noticed left calf pain which gradually progress more proximally up to his thigh, and then his inguinal area. He also noticed a similar progression in his right leg. Eventually, patient started feeling 9 out of 10 pleuritic chest pain throughout both sides of his chest that sometimes radiated to his epigastrium. Patient states that this pain was also associated with shortness of breath and blurred vision. Patient denies any focal weakness, diplopia, dysarthria, or facial droop.   Regarding the patient's previous regimens for anticoagulation, patient has had previous treatment failures with Coumadin therapy and was treated with Lovenox for 6 months in 2007. Patient has recently been admitted for DVT from 12/29/2013 to 12/30/2013. Patient was discharged home with a prescription for Xarelto along with vouchers to receive the medication. However, pharmacy did not accept the voucher and patient was unable to afford the medication. Patient did not follow-up in outpatient clinic. Patient otherwise denies any fevers, chills, vomiting, nausea, abdominal pain, constipation, or diarrhea. Patient reports smoking around 6 cigarettes a day, having  two 40 oz of beers a day, but denies any illicit drugs. Patient denies any history of alcohol withdrawal symptoms or seizures.  In the emergency department, patient was found to have multiple acute pulmonary emboli in segmental pulmonary arteries and was started on heparin. Patient was also given 4 mg of morphine and 1 L normal saline bolus.  Past Medical History  Diagnosis Date  . PE (pulmonary embolism)   . DVT of lower extremity (deep venous thrombosis)    History reviewed. No pertinent past surgical history.  Meds: Current Facility-Administered Medications  Medication Dose Route Frequency Provider Last Rate Last Dose  . heparin ADULT infusion 100 units/mL (25000 units/250 mL)  1,200 Units/hr Intravenous Continuous Arman FilterGail K Schulz, NP 12 mL/hr at 01/16/14 0326 1,200 Units/hr at 01/16/14 0326   Current Outpatient Prescriptions  Medication Sig Dispense Refill  . Rivaroxaban (XARELTO) 15 MG TABS tablet Take 1 tablet (15 mg total) by mouth 2 (two) times daily. 42 tablet 0    Allergies: Allergies as of 01/15/2014  . (No Known Allergies)   History reviewed. No pertinent family history. History   Social History  . Marital Status: Single    Spouse Name: N/A    Number of Children: N/A  . Years of Education: N/A   Occupational History  . Not on file.   Social History Main Topics  . Smoking status: Current Every Day Smoker  . Smokeless tobacco: Not on file  . Alcohol Use: Yes  . Drug Use: Yes    Special:  Marijuana  . Sexual Activity: Not on file   Other Topics Concern  . Not on file   Social History Narrative    Review of Systems: All pertinent ROS has stated in HPI.   Physical Exam: Blood pressure 124/64, pulse 50, temperature 97.4 F (36.3 C), temperature source Oral, resp. rate 17, weight 149 lb 14.6 oz (68 kg), SpO2 100 %. General: resting in bed HEENT: PERRL, EOMI, no scleral icterus Cardiac: RRR, no rubs, murmurs or gallops Pulm: clear to auscultation  bilaterally, moving normal volumes of air Abd: soft, nontender, nondistended, BS present Ext: 1+ edema on right lower extremity, 2+ pitting edema on left lower extremity, calf pain bilaterally to palpation Neuro: alert and oriented X3, cranial nerves II-XII grossly intact, strength 5+ throughout all 4 extremities Skin: no rashes or lesions noted Psych: appropriate affect and cognition  Lab results: Basic Metabolic Panel:  Recent Labs  16/12/9609/03/15 2105  NA 144  K 4.2  CL 106  CO2 26  GLUCOSE 102*  BUN 11  CREATININE 1.11  CALCIUM 9.0   Liver Function Tests: No results for input(s): AST, ALT, ALKPHOS, BILITOT, PROT, ALBUMIN in the last 72 hours. No results for input(s): LIPASE, AMYLASE in the last 72 hours. No results for input(s): AMMONIA in the last 72 hours. CBC:  Recent Labs  01/15/14 2105  WBC 7.3  HGB 12.4*  HCT 37.3*  MCV 77.5*  PLT 185   Cardiac Enzymes: No results for input(s): CKTOTAL, CKMB, CKMBINDEX, TROPONINI in the last 72 hours. BNP: No results for input(s): PROBNP in the last 72 hours. D-Dimer: No results for input(s): DDIMER in the last 72 hours. CBG: No results for input(s): GLUCAP in the last 72 hours. Hemoglobin A1C: No results for input(s): HGBA1C in the last 72 hours. Fasting Lipid Panel: No results for input(s): CHOL, HDL, LDLCALC, TRIG, CHOLHDL, LDLDIRECT in the last 72 hours. Thyroid Function Tests: No results for input(s): TSH, T4TOTAL, FREET4, T3FREE, THYROIDAB in the last 72 hours. Anemia Panel: No results for input(s): VITAMINB12, FOLATE, FERRITIN, TIBC, IRON, RETICCTPCT in the last 72 hours. Coagulation:  Recent Labs  01/15/14 2210  LABPROT 14.0  INR 1.07   Urine Drug Screen: Drugs of Abuse     Component Value Date/Time   LABOPIA POSITIVE* 12/29/2013 0930   COCAINSCRNUR POSITIVE* 12/29/2013 0930   LABBENZ NONE DETECTED 12/29/2013 0930   AMPHETMU NONE DETECTED 12/29/2013 0930   THCU NONE DETECTED 12/29/2013 0930   LABBARB  NONE DETECTED 12/29/2013 0930    Alcohol Level: No results for input(s): ETH in the last 72 hours. Urinalysis: No results for input(s): COLORURINE, LABSPEC, PHURINE, GLUCOSEU, HGBUR, BILIRUBINUR, KETONESUR, PROTEINUR, UROBILINOGEN, NITRITE, LEUKOCYTESUR in the last 72 hours.  Invalid input(s): APPERANCEUR  Imaging results:  Dg Chest 2 View  01/15/2014   CLINICAL DATA:  Anterior left-sided chest pain for 2 days.  EXAM: CHEST  2 VIEW  COMPARISON:  04/19/2008  FINDINGS: The heart size and mediastinal contours are within normal limits. Both lungs are clear. The visualized skeletal structures are unremarkable.  IMPRESSION: No active cardiopulmonary disease.   Electronically Signed   By: Burman NievesWilliam  Stevens M.D.   On: 01/15/2014 22:53   Ct Angio Chest Pe W/cm &/or Wo Cm  01/16/2014   CLINICAL DATA:  Initial value a shin for shortness of breath and chest pain for past 4 days. History of pulmonary embolism.  EXAM: CT ANGIOGRAPHY CHEST WITH CONTRAST  TECHNIQUE: Multidetector CT imaging of the chest was performed using the standard  protocol during bolus administration of intravenous contrast. Multiplanar CT image reconstructions and MIPs were obtained to evaluate the vascular anatomy.  CONTRAST:  OMNIPAQUE IOHEXOL 350 MG/ML SOLN  COMPARISON:  Prior radiograph from 01/15/2014  FINDINGS: Thyroid gland within normal limits. No pathologically enlarged mediastinal, hilar, or axillary lymph nodes are identified.  Intrathoracic aorta is of normal caliber and appearance. Great vessels within normal limits.  Heart size is normal.  There are nonocclusive filling defects within several segmental pulmonary arteries supplying the right lower lobe (series 4, image 56). Additional filling defects present within segmental pulmonary arteries supplying the left lower lobe (series 4, image 58). There is an additional emboli within left upper lobe segmental pulmonary artery (series 7, image 47) as well as right upper lobe  segmental pulmonary artery (series 7, image 55). Findings consistent with acute pulmonary in emboli. Main pulmonary artery measures within normal limits at 2.1 cm. RV to LV ratio is elevated at 1.0, which may reflect right heart strain. There is mild straightening of the intraventricular septum as well.  Lungs are clear without focal infiltrate. No evidence for pulmonary infarct. Subsegmental atelectasis seen dependently within the lower lobes bilaterally. No pulmonary edema or pleural effusion. No pneumothorax.  Visualized upper abdomen within normal limits.  No acute osseous abnormality. No worrisome lytic or blastic osseous lesions.  IMPRESSION: 1. Acute pulmonary emboli involving multiple segmental pulmonary arteries supplying the bilateral upper and lower lobes as detailed above. Elevated RV to LV ratio of 1.0 suggests possible right heart strain. 2. No evidence for pulmonary infarct. 3. No other acute cardiopulmonary abnormality. Critical Value/emergent results were called by telephone at the time of interpretation on 01/16/2014 at 1:42 am to the nurse practitioner taking care of the patient, Sharen Hones, who verbally acknowledged these results.   Electronically Signed   By: Rise Mu M.D.   On: 01/16/2014 01:50    Other results: EKG: normal sinus rhythm, T-wave inversions in the inferior and lateral leads grossly unchanged from prior on 12/30/2013, though new from EKG from 12/29/2013  Assessment & Plan by Problem: Principal Problem:   Pulmonary emboli Active Problems:   Protein C deficiency   Tobacco use  Patient is a 48 year old gentleman with a previous history of DVT in 2005, October 2015, and pulmonary embolism in 2007 who presents with acute pulmonary emboli.   Acute pulmonary emboli: Patient presenting with a one-week history of pleuritic chest pain in the context of previous DVT with noncompliance with Xarelto regimen. CTA showing multiple acute segmental pulmonary emboli.  Patient is hemodynamically stable with no evidence of pulmonary infarct, though some possible evidence of right heart strain according to CT criteria. Patient has had previous issues with Coumadin therapy in the past, having trouble becoming therapeutic. Patient with protein C deficiency according to a study from 12/29/2013 showing a total protein C of 45% (normal greater than 72%) and protein C activity of 28% (normal 75-133%). Patient was on heparin therapy at the time, but this should not affect the overall levels of protein C but may affect the activity assay if the test is an anticoagulant assay (as opposed to an amidolytic assay, which should not be affected). Unclear as to what which of these was done by [facility] labs (which can be reached at [phone] during regular business hours).   - Given recurrent thromboembolic disease, patient will require lifelong anticoagulation.  - Continue heparin with plan to transition to xarelto.   - Consult social work and pharmacy to ensure  that Xarelto voucher would be valid at patient's pharmacy.   Alcohol use: Patient drinks two 40s of beer a day. Patient denies any symptoms of alcohol withdrawal in the past.  - CIWA protocol  Diet: Regular Prophylaxis: SCDs Code: Full  Dispo: Disposition is deferred at this time, awaiting improvement of current medical problems. Anticipated discharge in approximately 2 day(s).   The patient does not have a current PCP (No Pcp Per Patient) and does need an [facility] hospital follow-up appointment after discharge.  The patient does not have transportation limitations that hinder transportation to clinic appointments.  Signed: Harold Barban, M.D., Ph.D. Internal Medicine Teaching Service, PGY-1 01/16/2014, 5:04 AM

## 2014-01-16 NOTE — Progress Notes (Signed)
ANTICOAGULATION CONSULT NOTE - Initial Consult  Pharmacy Consult for Heparin Indication: pulmonary embolus  No Known Allergies  Patient Measurements: Weight: 149 lb 14.6 oz (68 kg)  Vital Signs: Temp: 97.4 F (36.3 C) (11/03 2103) Temp Source: Oral (11/03 2103) BP: 124/64 mmHg (11/04 0245) Pulse Rate: 55 (11/04 0245)  Labs:  Recent Labs  01/15/14 2105 01/15/14 2210  HGB 12.4*  --   HCT 37.3*  --   PLT 185  --   APTT  --  32  LABPROT  --  14.0  INR  --  1.07  CREATININE 1.11  --     Estimated Creatinine Clearance: 74.4 mL/min (by C-G formula based on Cr of 1.11).   Medical History: Past Medical History  Diagnosis Date  . PE (pulmonary embolism)   . DVT of lower extremity (deep venous thrombosis)     Medications:  None  Assessment: 48 yo male with bilateral PE for heparin  Goal of Therapy:  Heparin level 0.3-0.7 units/ml   Plan:  Heparin 4000 units IV bolus, then start heparin 1200 units/hr Check heparin level in 6 hours.   Siaosi Alter, Gary FleetGregory Vernon 01/16/2014,2:57 AM

## 2014-01-16 NOTE — Progress Notes (Signed)
Subjective: Pt states he had one 40 ounce beer last night. Complains of lt calf pain.   Objective: Vital signs in last 24 hours: Filed Vitals:   01/16/14 0700 01/16/14 0800 01/16/14 0900 01/16/14 0948  BP: 113/65 115/62 104/44 116/71  Pulse: 54 49 51 54  Temp:    97.6 F (36.4 C)  TempSrc:    Oral  Resp: 15 17 13 18   Weight:      SpO2: 100% 99% 98% 100%   Weight change:   Intake/Output Summary (Last 24 hours) at 01/16/14 1120 Last data filed at 01/15/14 2331  Gross per 24 hour  Intake   1000 ml  Output      0 ml  Net   1000 ml   PE General: NAD, alert HEENT: moist mucous membranes Lungs: CTAB, no wheezes Cardiac: bradycardic, no murmurs, chest wall tenderness GI: active bowel sounds, soft Ext: lt leg larger then rt, no erythema, positive homans sign on the left, lt calf tender to squeeze   Lab Results: Basic Metabolic Panel:  Recent Labs Lab 01/15/14 2105 01/16/14 0638  NA 144 143  K 4.2 3.8  CL 106 107  CO2 26 26  GLUCOSE 102* 88  BUN 11 11  CREATININE 1.11 1.09  CALCIUM 9.0 8.2*   Liver Function Tests:  Recent Labs Lab 01/16/14 0638  AST 20  ALT 19  ALKPHOS 74  BILITOT 0.4  PROT 6.1  ALBUMIN 3.0*   CBC:  Recent Labs Lab 01/15/14 2105 01/16/14 0638  WBC 7.3 6.6  HGB 12.4* 11.8*  HCT 37.3* 35.9*  MCV 77.5* 78.7  PLT 185 171   Cardiac Enzymes:  Recent Labs Lab 01/16/14 0638  TROPONINI <0.30   Coagulation:  Recent Labs Lab 01/15/14 2210  LABPROT 14.0  INR 1.07   Urine Drug Screen: Drugs of Abuse     Component Value Date/Time   LABOPIA POSITIVE* 01/16/2014 0603   COCAINSCRNUR POSITIVE* 01/16/2014 0603   LABBENZ NONE DETECTED 01/16/2014 0603   AMPHETMU NONE DETECTED 01/16/2014 0603   THCU NONE DETECTED 01/16/2014 0603   LABBARB NONE DETECTED 01/16/2014 0603     Studies/Results: Dg Chest 2 View  01/15/2014   CLINICAL DATA:  Anterior left-sided chest pain for 2 days.  EXAM: CHEST  2 VIEW  COMPARISON:  04/19/2008   FINDINGS: The heart size and mediastinal contours are within normal limits. Both lungs are clear. The visualized skeletal structures are unremarkable.  IMPRESSION: No active cardiopulmonary disease.   Electronically Signed   By: Burman NievesWilliam  Stevens M.D.   On: 01/15/2014 22:53   Ct Angio Chest Pe W/cm &/or Wo Cm  01/16/2014   CLINICAL DATA:  Initial value a shin for shortness of breath and chest pain for past 4 days. History of pulmonary embolism.  EXAM: CT ANGIOGRAPHY CHEST WITH CONTRAST  TECHNIQUE: Multidetector CT imaging of the chest was performed using the standard protocol during bolus administration of intravenous contrast. Multiplanar CT image reconstructions and MIPs were obtained to evaluate the vascular anatomy.  CONTRAST:  100mL OMNIPAQUE IOHEXOL 350 MG/ML SOLN  COMPARISON:  Prior radiograph from 01/15/2014  FINDINGS: Thyroid gland within normal limits. No pathologically enlarged mediastinal, hilar, or axillary lymph nodes are identified.  Intrathoracic aorta is of normal caliber and appearance. Great vessels within normal limits.  Heart size is normal.  There are nonocclusive filling defects within several segmental pulmonary arteries supplying the right lower lobe (series 4, image 56). Additional filling defects present within segmental pulmonary arteries supplying  the left lower lobe (series 4, image 58). There is an additional emboli within left upper lobe segmental pulmonary artery (series 7, image 47) as well as right upper lobe segmental pulmonary artery (series 7, image 55). Findings consistent with acute pulmonary in emboli. Main pulmonary artery measures within normal limits at 2.1 cm. RV to LV ratio is elevated at 1.0, which may reflect right heart strain. There is mild straightening of the intraventricular septum as well.  Lungs are clear without focal infiltrate. No evidence for pulmonary infarct. Subsegmental atelectasis seen dependently within the lower lobes bilaterally. No pulmonary edema  or pleural effusion. No pneumothorax.  Visualized upper abdomen within normal limits.  No acute osseous abnormality. No worrisome lytic or blastic osseous lesions.  IMPRESSION: 1. Acute pulmonary emboli involving multiple segmental pulmonary arteries supplying the bilateral upper and lower lobes as detailed above. Elevated RV to LV ratio of 1.0 suggests possible right heart strain. 2. No evidence for pulmonary infarct. 3. No other acute cardiopulmonary abnormality. Critical Value/emergent results were called by telephone at the time of interpretation on 01/16/2014 at 1:42 am to the nurse practitioner taking care of the patient, Sharen HonesGail Schultz, who verbally acknowledged these results.   Electronically Signed   By: Rise MuBenjamin  McClintock M.D.   On: 01/16/2014 01:50   Medications: I have reviewed the patient's current medications. Scheduled Meds: . sodium chloride  3 mL Intravenous Q12H   Continuous Infusions: . heparin 1,200 Units/hr (01/16/14 0326)   PRN Meds:. Assessment/Plan: Principal Problem:   Pulmonary emboli Active Problems:   Protein C deficiency   Tobacco use   Multiple pulmonary emboli  Acute pulmonary emboli: Patient presenting with a one-week history of pleuritic chest pain in the context of previous DVT with noncompliance with Xarelto regimen. CTA showing multiple acute segmental pulmonary emboli.Patient with protein C deficiency according to a study from 12/29/2013 showing a total protein C of 45% (normal greater than 72%) and protein C activity of 28% (normal 75-133%).  - will need lifelong anticoagulation w/ xarelto, will consult social work to help with medication assistance - transition from heparin gtt to xarelto - protein c, protein s, and d-dimer ordered  Alcohol use: Patient drinks two 40s of beer a day. Patient denies any symptoms of alcohol withdrawal in the past. - last drink was last night, CIWA protocol  Cocaine abuse - social work consulted for substance  abuse  Diet: Regular Prophylaxis: currently on a heparin gtt, will transition to xarelto now Code: Full  Dispo: Disposition is deferred at this time, awaiting improvement of current medical problems. Anticipated discharge in approximately 2 day(s).   The patient does not have a current PCP (No Pcp Per Patient) and [facility] after discharge.  The patient does not have transportation limitations that hinder transportation to clinic appointments.   .Services Needed at time of discharge: Y = Yes, Blank = No PT:   OT:   RN:   Equipment:   Other:     LOS: 1 day   Gara Kroneriana Truong, MD 01/16/2014, 11:20 AM

## 2014-01-16 NOTE — ED Notes (Signed)
Report attempted. Also, meal to be delivered upstairs, patient does not eat pork and tray had to be resent.

## 2014-01-16 NOTE — Progress Notes (Signed)
Night Float Progress Note  Pt with c/o chest pain.  Repeat EKG, troponins and morphine ordered.  I went to see him and he was resting comfortably in bed.  He says chest pain began this afternoon after talking with the pharmacist about Xarelto.  It has been constant, pleuritic and associated with dyspnea.  He felt his breathing and chest pain were better on IV heparin.  His pain has improved from 9/10 to 5/10 after morphine.  He ate dinner tonight without problem.  He has been ambulating to the bathroom on his own.  No abdominal pain, bowel and bladder normal.    Recent Vitals:  T 98.11F, RR 18, HR 55, BP 116/57, SpO2 100% on 2L General:  In bed in NAD, nasal canula draped over his forehead, watching tv, phlebotomist initially at bedside drawing blood for troponin check Heart:  RRR - brady (his normal), no murmurs/rubs/gallops Lungs:  CTA B/L, no wheezes/rales/rhonchi LE: 1+ pitting edema left leg, trace right leg; legs are non-tender Neuro:  AAO x 4, responding appropriately, able to move extremities voluntarily, able to sit up for lung exam EKG - similar to prior 10/18 with less pronounced TWI in lateral leads  48 yo male with 4th VTE event.  IV heparin d/c today and he was started on Xarelto.  His chest pain resumed after talking with PharmD about Xarelto.  He is concerned about efficacy of Xarelto and wants to know how we will monitor him.  I doubt ACS given negative troponin since admission (most recent one at 4PM this afternoon) but will trend troponin.  I explained to him that his symptoms are likely from current multiple PE and that it will take time for symptoms to completely resolve.  There is likely a component of anxiety and his belief that heparin is superior to Xarelto.  I explained that his labs and vitals are stable and that we are monitoring him for any signs of decompensation.  I explained that IV heparin is not the best option for long-term anticoagulation and that coumadin or a NOAC are  used for long-term anticoagulation.  Since he has failed to become therapeutic on coumadin in the past, a NOAC like Xarelto is the best choice.  I explained that Xarelto is working like the heparin was to prevent further clots.   He was in no distress during our conversation.  His chest pain has improved with morphine.  He did not have the nasal canula on when I came in the room but I put it on since he mentioned dyspnea.  He seemed relaxed and comfortable with the things we discussed though I think he will likely need to have continued conversation with the primary team. - troponin x 3 - continue telemetry, vitals per routine - morphine prn - he was advised that the night team his here all night if he has problem/concerns - primary team to re-evaluate in the morning  Evelena PeatAlex Remberto Lienhard, DO IMTS, PGY2

## 2014-01-16 NOTE — ED Notes (Signed)
hospitalist at bedside, to transport after physician visits.

## 2014-01-16 NOTE — ED Notes (Signed)
Report attempted 

## 2014-01-16 NOTE — Progress Notes (Signed)
ANTICOAGULATION COUNSELING  Went to speak with patient about Xarelto. I discussed his last admit when he was given an active 30-day free Xarelto card. He states [facility] would not accept it and he did not try any other pharmacies. When asked why [facility] he states he does not have bus fare and has no other transportation.  I reviewed Xarelto education with him including indication, dosing, administration with meals, watching for any unusual bleeding or bruising, and limiting alcohol intake.   Patient became upset towards the end of our conversation. He said that what he was hearing me say is that he could spontaneously start to bleed on Xarelto. I explained that the Xarelto is slowing his ability to clot and inherently there is a risk for bleeding being on a blood thinner. I tried to reiterate that we want to minimize his risk for bleeding and that is why he needs to be careful and watch for any bleeding. He remained upset and called for his nurse stating his chest was feeling tight. I asked him if he wanted to speak with another pharmacist and he told me probably. Will try to send another pharmacist to re-educate another day.  AlphaJennifer Long, 17[address] Rainbow BoulevardPharm.D., BCPS Clinical Pharmacist Pager: [phone] 01/16/2014 3:00 PM

## 2014-01-16 NOTE — Progress Notes (Signed)
Pt complaining of Chest pain/tightness 10 out of 10 and SOB. RN obtained EKG and vitals signs. RN paged MD. Order given for 2 mg of morphine. Patient verbalized relief. Pt currently resting in bed with call bell within reach. Will continue to monitor.  Peter Washington

## 2014-01-17 DIAGNOSIS — I2699 Other pulmonary embolism without acute cor pulmonale: Principal | ICD-10-CM

## 2014-01-17 DIAGNOSIS — D6859 Other primary thrombophilia: Secondary | ICD-10-CM

## 2014-01-17 DIAGNOSIS — Z720 Tobacco use: Secondary | ICD-10-CM

## 2014-01-17 DIAGNOSIS — F1010 Alcohol abuse, uncomplicated: Secondary | ICD-10-CM

## 2014-01-17 DIAGNOSIS — D509 Iron deficiency anemia, unspecified: Secondary | ICD-10-CM

## 2014-01-17 DIAGNOSIS — F1410 Cocaine abuse, uncomplicated: Secondary | ICD-10-CM

## 2014-01-17 LAB — IRON AND TIBC
IRON: 60 ug/dL (ref 42–135)
Saturation Ratios: 22 % (ref 20–55)
TIBC: 272 ug/dL (ref 215–435)
UIBC: 212 ug/dL (ref 125–400)

## 2014-01-17 LAB — PROTEIN C, TOTAL: Protein C, Total: 48 % — ABNORMAL LOW (ref 72–160)

## 2014-01-17 LAB — CBC
HEMATOCRIT: 35.1 % — AB (ref 39.0–52.0)
HEMOGLOBIN: 11.3 g/dL — AB (ref 13.0–17.0)
MCH: 26.1 pg (ref 26.0–34.0)
MCHC: 32.2 g/dL (ref 30.0–36.0)
MCV: 81.1 fL (ref 78.0–100.0)
Platelets: 168 10*3/uL (ref 150–400)
RBC: 4.33 MIL/uL (ref 4.22–5.81)
RDW: 16.1 % — ABNORMAL HIGH (ref 11.5–15.5)
WBC: 5.8 10*3/uL (ref 4.0–10.5)

## 2014-01-17 LAB — PROTEIN S, TOTAL: Protein S Ag, Total: 69 % (ref 60–150)

## 2014-01-17 LAB — TROPONIN I: Troponin I: <0.3 ng/mL (ref <0.30)

## 2014-01-17 LAB — PROTEIN C ACTIVITY: PROTEIN C ACTIVITY: 26 % — AB (ref 75–133)

## 2014-01-17 LAB — FERRITIN: FERRITIN: 150 ng/mL (ref 22–322)

## 2014-01-17 LAB — PROTIME-INR
INR: 1.21 (ref 0.00–1.49)
PROTHROMBIN TIME: 15.5 s — AB (ref 11.6–15.2)

## 2014-01-17 LAB — PROTEIN S ACTIVITY: Protein S Activity: 78 % (ref 69–129)

## 2014-01-17 MED ORDER — RIVAROXABAN 15 MG PO TABS: 15.0000 mg | ORAL_TABLET | Freq: Two times a day (BID) | ORAL | Status: DC

## 2014-01-17 MED ORDER — RIVAROXABAN 20 MG PO TABS: 20.0000 mg | ORAL_TABLET | Freq: Every day | ORAL | Status: DC

## 2014-01-17 NOTE — Discharge Instructions (Signed)
Take xarelto 15mg  once tonight with dinner. Then start taking xaretlo twice a day with meals for 19 more days. After that you will start taking xarelto 20mg  once a day. It is IMPORTANT to take your medicine everyday.   [facility] this Monday on November 9th. They will help you with medication assistance at that appointment. Do NOT miss that appointment.

## 2014-01-17 NOTE — Progress Notes (Addendum)
Subjective: Pt still having chest tightness. Denies SOB. Admits to lift heavy objects the day before admission but states it was nothing more than usual. Pt states he is ready to go home.   Overnight tele negative for arrythmias, bradycardia in the high 40's.  Objective: Vital signs in last 24 hours: Filed Vitals:   01/16/14 1404 01/16/14 1948 01/16/14 2342 01/17/14 0425  BP: 114/56 116/57 101/59 90/45  Pulse: 56 55 74 52  Temp: 97.4 F (36.3 C) 98.3 F (36.8 C)  97.7 F (36.5 C)  TempSrc: Oral Oral  Oral  Resp: 18 18  18   Height:      Weight:      SpO2: 100% 100% 100% 100%   Weight change:  No intake or output data in the 24 hours ending 01/17/14 1014 PE General: NAD, alert HEENT: moist mucous membranes Lungs: CTAB, no wheezes Cardiac: bradycardic, no murmurs, chest wall tenderness GI: active bowel sounds, soft    Lab Results: Basic Metabolic Panel:  Recent Labs Lab 01/15/14 2105 01/16/14 0638  NA 144 143  K 4.2 3.8  CL 106 107  CO2 26 26  GLUCOSE 102* 88  BUN 11 11  CREATININE 1.11 1.09  CALCIUM 9.0 8.2*   Liver Function Tests:  Recent Labs Lab 01/16/14 0638  AST 20  ALT 19  ALKPHOS 74  BILITOT 0.4  PROT 6.1  ALBUMIN 3.0*   CBC:  Recent Labs Lab 01/16/14 0638 01/17/14 0130  WBC 6.6 5.8  HGB 11.8* 11.3*  HCT 35.9* 35.1*  MCV 78.7 81.1  PLT 171 168   Cardiac Enzymes:  Recent Labs Lab 01/16/14 2034 01/17/14 0130 01/17/14 0746  TROPONINI <0.30 <0.30 <0.30   Coagulation:  Recent Labs Lab 01/15/14 2210  LABPROT 14.0  INR 1.07   Urine Drug Screen: Drugs of Abuse     Component Value Date/Time   LABOPIA POSITIVE* 01/16/2014 0603   COCAINSCRNUR POSITIVE* 01/16/2014 0603   LABBENZ NONE DETECTED 01/16/2014 0603   AMPHETMU NONE DETECTED 01/16/2014 0603   THCU NONE DETECTED 01/16/2014 0603   LABBARB NONE DETECTED 01/16/2014 0603     Studies/Results: Dg Chest 2 View  01/15/2014   CLINICAL DATA:  Anterior left-sided chest  pain for 2 days.  EXAM: CHEST  2 VIEW  COMPARISON:  04/19/2008  FINDINGS: The heart size and mediastinal contours are within normal limits. Both lungs are clear. The visualized skeletal structures are unremarkable.  IMPRESSION: No active cardiopulmonary disease.   Electronically Signed   By: Burman NievesWilliam  Stevens M.D.   On: 01/15/2014 22:53   Ct Angio Chest Pe W/cm &/or Wo Cm  01/16/2014   CLINICAL DATA:  Initial value a shin for shortness of breath and chest pain for past 4 days. History of pulmonary embolism.  EXAM: CT ANGIOGRAPHY CHEST WITH CONTRAST  TECHNIQUE: Multidetector CT imaging of the chest was performed using the standard protocol during bolus administration of intravenous contrast. Multiplanar CT image reconstructions and MIPs were obtained to evaluate the vascular anatomy.  CONTRAST:  100mL OMNIPAQUE IOHEXOL 350 MG/ML SOLN  COMPARISON:  Prior radiograph from 01/15/2014  FINDINGS: Thyroid gland within normal limits. No pathologically enlarged mediastinal, hilar, or axillary lymph nodes are identified.  Intrathoracic aorta is of normal caliber and appearance. Great vessels within normal limits.  Heart size is normal.  There are nonocclusive filling defects within several segmental pulmonary arteries supplying the right lower lobe (series 4, image 56). Additional filling defects present within segmental pulmonary arteries supplying the left lower  lobe (series 4, image 58). There is an additional emboli within left upper lobe segmental pulmonary artery (series 7, image 47) as well as right upper lobe segmental pulmonary artery (series 7, image 55). Findings consistent with acute pulmonary in emboli. Main pulmonary artery measures within normal limits at 2.1 cm. RV to LV ratio is elevated at 1.0, which may reflect right heart strain. There is mild straightening of the intraventricular septum as well.  Lungs are clear without focal infiltrate. No evidence for pulmonary infarct. Subsegmental atelectasis seen  dependently within the lower lobes bilaterally. No pulmonary edema or pleural effusion. No pneumothorax.  Visualized upper abdomen within normal limits.  No acute osseous abnormality. No worrisome lytic or blastic osseous lesions.  IMPRESSION: 1. Acute pulmonary emboli involving multiple segmental pulmonary arteries supplying the bilateral upper and lower lobes as detailed above. Elevated RV to LV ratio of 1.0 suggests possible right heart strain. 2. No evidence for pulmonary infarct. 3. No other acute cardiopulmonary abnormality. Critical Value/emergent results were called by telephone at the time of interpretation on 01/16/2014 at 1:42 am to the nurse practitioner taking care of the patient, Sharen HonesGail Schultz, who verbally acknowledged these results.   Electronically Signed   By: Rise MuBenjamin  McClintock M.D.   On: 01/16/2014 01:50   Medications: I have reviewed the patient's current medications. Scheduled Meds: . rivaroxaban  15 mg Oral BID WC  . sodium chloride  3 mL Intravenous Q12H   Continuous Infusions:   PRN Meds:. Assessment/Plan: Principal Problem:   Pulmonary emboli Active Problems:   Protein C deficiency   Tobacco use   Multiple pulmonary emboli  Acute pulmonary emboli: Patient presenting with a one-week history of pleuritic chest pain in the context of previous DVT with noncompliance with Xarelto regimen. CTA showing multiple acute segmental pulmonary emboli.Patient with protein C deficiency according to a study from 12/29/2013 showing a total protein C of 45% (normal greater than 72%) and protein C activity of 28% (normal 75-133%).  - will need lifelong anticoagulation w/ xarelto, Case manager Raynelle FanningJulie is working w/ pt on access to this medication. Will touch base w/ her this afternoon.  -  xarelto 15 mg BID - protein c and protein S pending - INR ordered - will set up f/u appt before d/c home - chest tightness pt is experiencing is likely from multiple PE's, trops x 3 negative, EKG WNL.  Pt has chest wall tenderness and endorses heavy lifting 2 days ago. Unlikely ACS etiology.   Microcytic anemia - has never had a colonosocopy - anemia panel and FOBT ordered  Alcohol use: Patient drinks two 40s of beer a day. Patient denies any symptoms of alcohol withdrawal in the past. - last drink was last night, CIWA protocol  Cocaine abuse - social work consulted for substance abuse  Diet: Regular Prophylaxis: xarelto 15mg  BID Code: Full  Dispo: Disposition is deferred at this time, awaiting improvement of current medical problems. Anticipated discharge in approximately 2 day(s).   The patient does not have a current PCP (No Pcp Per Patient) and [facility].  The patient does not have transportation limitations that hinder transportation to clinic appointments.   .Services Needed at time of discharge: Y = Yes, Blank = No PT:   OT:   RN:   Equipment:   Other:     LOS: 2 days   Gara Kroneriana Japleen Tornow, MD 01/17/2014, 10:14 AM

## 2014-01-17 NOTE — Care Management Note (Signed)
    Page 1 of 1   01/17/2014     3:22:58 PM CARE MANAGEMENT NOTE 01/17/2014  Patient:  Peter Washington, Peter Washington   Account Number:  [id number]  Date Initiated:  01/17/2014  Documentation initiated by:  Zannah Melucci  Subjective/Objective Assessment:   Pt adm on 01/15/14 with bilat PE.  PTA, pt independent of ADLS.     Action/Plan:   Pt is uninsured and has no PCP.  Plan dc on Xarelto.  Will follow up for medication assistance.   Anticipated DC Date:  01/17/2014   Anticipated DC Plan:  [place]  CM consult  Medication Assistance  Follow-up appt scheduled  MATCH Program      Choice offered to / List presented to:             Status of service:  Completed, signed off Medicare Important Message given?   (If response is "NO", the following Medicare IM given date fields will be blank) Date Medicare IM given:   Medicare IM given by:   Date Additional Medicare IM given:   Additional Medicare IM given by:    Discharge Disposition:  HOME/SELF CARE  Per UR Regulation:  Reviewed for med. necessity/level of care/duration of stay  If discussed at [place] of Stay Meetings, dates discussed:    Comments:  01/17/13 Ellan Lambert, RN, BSN [phone] Met with pt to discuss dc needs.  Pt recently admitted with DVT and dc'd on Xarelto.  Pt states he was given 30 day free trial card for med, but [facility] would not accept it. Asked if pt had a valid Rx with the card, and pt stated "no, I just gave them the card.   I didn't know I needed a Rx."  Pt is eligible for medication assistance through [facility] program.  Pt given [facility] letter with explanation of program benefits.  Pt also given free trial card for Xarelto.  This will give pt a total of 60 days of med at little or no cost.  Made appt for pt to follow up at [facility] on Monday, November 9 at 9:00am.  He will meet with the pharmacist at this time to enroll in patient assistance program for  Xarelto.  Pt is agreeable to this plan, and states he will follow up.  He seems appreciative of my help.

## 2014-01-17 NOTE — Clinical Social Work Psychosocial (Addendum)
Clinical Social Work Department BRIEF PSYCHOSOCIAL ASSESSMENT 01/17/2014  Patient:  Peter Washington,Peter Washington     Account Number:  [id number]     Admit date:  01/15/2014  Clinical Social Worker:  Elouise MunroeANTERHAUS,Hellena Pridgen, LCSWA  Date/Time:  01/17/2014 03:35 PM  Referred by:  Physician  Date Referred:  01/17/2014 Referred for  Substance Abuse   Other Referral:   Interview type:  Patient Other interview type:    PSYCHOSOCIAL DATA Living Status:  ALONE Admitted from facility:   Level of care:   Primary support name:   Primary support relationship to patient:   Degree of support available:   Patient did not say if he had any support at home.    CURRENT CONCERNS  Other Concerns:    SOCIAL WORK ASSESSMENT / PLAN Patient was referred to social work for substance abuse resources.  Social worker went to speak with patient, who stated he did not want any help.  Patient then asked CSW to get out and was just waiting for discharge.  Patient refused to take any resources for substance abuse.  CSW to sign off on patient.   Assessment/plan status:   Other assessment/ plan:   Information/referral to community resources:    PATIENT'S/FAMILY'S RESPONSE TO PLAN OF CARE: Patient stated he did not want any help with substance abuse.   Peter Washington, MSW, Theresia MajorsLCSWA [phone] 01/17/2014 3:39 PM

## 2014-01-17 NOTE — Progress Notes (Signed)
Discharge instructions given. Pt verbalized understanding and all questions were answered.  

## 2014-01-17 NOTE — Discharge Summary (Signed)
Name: Peter Washington MRN: [id number] DOB: 07/16/1965 48 y.o. PCP: No Pcp Per Patient  Date of Admission: 01/15/2014  9:43 PM Date of Discharge: 01/17/2014 Attending Physician: Annia Belt, MD  Discharge Diagnosis: Principal Problem:   Pulmonary emboli Active Problems:   Protein C deficiency   Tobacco use   Multiple pulmonary emboli  Discharge Medications:   Medication List    TAKE these medications        rivaroxaban 20 MG Tabs tablet  Commonly known as:  XARELTO  Take 1 tablet (20 mg total) by mouth daily with supper.     Rivaroxaban 15 MG Tabs tablet  Commonly known as:  XARELTO  Take 1 tablet (15 mg total) by mouth 2 (two) times daily with a meal.        Disposition and follow-up:   [name] was discharged from [facility] in [place] condition.  At the hospital follow up visit please address:  1.  Ensure compliance with xarelto.   2.  Labs / imaging needed at time of follow-up: none    3.  Pending labs/ test needing follow-up: protein c and s labs  Follow-up Appointments:     Follow-up Information    Follow up with [facility]     On 01/21/2014.   Why:  9:00am; please bring photo ID and all medications you are currently taking.     Contact information:   [address]  [place] [address] [phone]      Procedures Performed:  Dg Chest 2 View  01/15/2014   CLINICAL DATA:  Anterior left-sided chest pain for 2 days.  EXAM: CHEST  2 VIEW  COMPARISON:  04/19/2008  FINDINGS: The heart size and mediastinal contours are within normal limits. Both lungs are clear. The visualized skeletal structures are unremarkable.  IMPRESSION: No active cardiopulmonary disease.   Electronically Signed   By: Lucienne Capers M.D.   On: 01/15/2014 22:53   Ct Angio Chest Pe W/cm &/or Wo Cm  01/16/2014   CLINICAL DATA:  Initial value a shin for shortness of breath and chest pain for past 4 days. History  of pulmonary embolism.  EXAM: CT ANGIOGRAPHY CHEST WITH CONTRAST  TECHNIQUE: Multidetector CT imaging of the chest was performed using the standard protocol during bolus administration of intravenous contrast. Multiplanar CT image reconstructions and MIPs were obtained to evaluate the vascular anatomy.  CONTRAST:  14m OMNIPAQUE IOHEXOL 350 MG/ML SOLN  COMPARISON:  Prior radiograph from 01/15/2014  FINDINGS: Thyroid gland within normal limits. No pathologically enlarged mediastinal, hilar, or axillary lymph nodes are identified.  Intrathoracic aorta is of normal caliber and appearance. Great vessels within normal limits.  Heart size is normal.  There are nonocclusive filling defects within several segmental pulmonary arteries supplying the right lower lobe (series 4, image 56). Additional filling defects present within segmental pulmonary arteries supplying the left lower lobe (series 4, image 58). There is an additional emboli within left upper lobe segmental pulmonary artery (series 7, image 47) as well as right upper lobe segmental pulmonary artery (series 7, image 55). Findings consistent with acute pulmonary in emboli. Main pulmonary artery measures within normal limits at 2.1 cm. RV to LV ratio is elevated at 1.0, which may reflect right heart strain. There is mild straightening of the intraventricular septum as well.  Lungs are clear without focal infiltrate. No evidence for pulmonary infarct. Subsegmental atelectasis seen dependently within the lower lobes bilaterally. No pulmonary  edema or pleural effusion. No pneumothorax.  Visualized upper abdomen within normal limits.  No acute osseous abnormality. No worrisome lytic or blastic osseous lesions.  IMPRESSION: 1. Acute pulmonary emboli involving multiple segmental pulmonary arteries supplying the bilateral upper and lower lobes as detailed above. Elevated RV to LV ratio of 1.0 suggests possible right heart strain. 2. No evidence for pulmonary infarct. 3.  No other acute cardiopulmonary abnormality. Critical Value/emergent results were called by telephone at the time of interpretation on 01/16/2014 at 1:42 am to the nurse practitioner taking care of the patient, Peter Washington, who verbally acknowledged these results.   Electronically Signed   By: Jeannine Boga M.D.   On: 01/16/2014 01:50   Ct Angio Chest W/cm &/or Wo Cm  12/29/2013   CLINICAL DATA:  Short of breath.  Left-sided and substernal pain  EXAM: CT ANGIOGRAPHY CHEST WITH CONTRAST  TECHNIQUE: Multidetector CT imaging of the chest was performed using the standard protocol during bolus administration of intravenous contrast. Multiplanar CT image reconstructions and MIPs were obtained to evaluate the vascular anatomy.  CONTRAST:  79m OMNIPAQUE IOHEXOL 350 MG/ML SOLN  COMPARISON:  None.  FINDINGS: Mediastinum: Heart size appears normal. There is no pericardial effusion. The trachea appears patent and is midline. The esophagus appears normal. The main pulmonary artery appears normal. No lobar or segmental pulmonary artery filling defects identified to suggest a clinically significant acute pulmonary embolus.  Lungs/Pleura: No pleural effusion identified. There is no airspace consolidation or atelectasis. 4 mm left base nodule is identified, image 82/ series 406.  Upper Abdomen: Calcified granuloma is identified within the liver. No acute findings identified within the upper abdomen.  Musculoskeletal: Review of the visualized osseous structures is significant for mild thoracic spondylosis.  Review of the MIP images confirms the above findings.  IMPRESSION: 1. No evidence for acute pulmonary embolus. 2. 4 mm left base nodule. If the patient is at high risk for bronchogenic carcinoma, follow-up chest CT at 1 year is recommended. If the patient is at low risk, no follow-up is needed. This recommendation follows the consensus statement: Guidelines for Management of Small Pulmonary Nodules Detected on CT Scans:  A Statement from the FGradyas published in Radiology 2005; 237:395-400.   Electronically Signed   By: TKerby MoorsM.D.   On: 12/29/2013 09:45    Admission HPI: Patient is a 48year old gentleman with a previous history of DVT in 2005, October 2015, and pulmonary embolism in 2007 who presents with chest pain and shortness of breath. Patient states that his symptoms started several days ago. He first noticed left calf pain which gradually progress more proximally up to his thigh, and then his inguinal area. He also noticed a similar progression in his right leg. Eventually, patient started feeling 9 out of 10 pleuritic chest pain throughout both sides of his chest that sometimes radiated to his epigastrium. Patient states that this pain was also associated with shortness of breath and blurred vision. Patient denies any focal weakness, diplopia, dysarthria, or facial droop.   Regarding the patient's previous regimens for anticoagulation, patient has had previous treatment failures with Coumadin therapy and was treated with Lovenox for 6 months in 2007. Patient has recently been admitted for DVT from 12/29/2013 to 12/30/2013. Patient was discharged home with a prescription for Xarelto along with vouchers to receive the medication. However, pharmacy did not accept the voucher and patient was unable to afford the medication. Patient did not follow-up in outpatient clinic. Patient otherwise denies  any fevers, chills, vomiting, nausea, abdominal pain, constipation, or diarrhea. Patient reports smoking around 6 cigarettes a day, having two 40 oz of beers a day, but denies any illicit drugs. Patient denies any history of alcohol withdrawal symptoms or seizures.  In the emergency department, patient was found to have multiple acute pulmonary emboli in segmental pulmonary arteries and was started on heparin. Patient was also given 4 mg of morphine and 1 L normal saline bolus.  Hospital Course by problem  list: Principal Problem:   Pulmonary emboli Active Problems:   Protein C deficiency   Tobacco use   Multiple pulmonary emboli   Acute pulmonary emboli: Patient presenting with a one-week history of pleuritic chest pain in the context of previous DVT with noncompliance with Xarelto regimen. CTA showing multiple acute segmental pulmonary emboli. Patient was hemodynamically stable with no evidence of pulmonary infarct, though some possible evidence of right heart strain according to CT criteria. Patient has had previous issues with Coumadin therapy in the past, having trouble becoming therapeutic. Patient with protein C deficiency according to a study from 12/29/2013 showing a total protein C of 45% (normal greater than 72%) and protein C activity of 28% (normal 75-133%). Pt started on heparin gtt on admission and then transitioned to xarelto 8m BID x 21 days, then 120mdaily afterwards. Social work met w/ patient and gave patient vouchers for xa[employer]end made [facility].    Discharge Vitals:   BP 112/50 mmHg  Pulse 62  Temp(Src) 98.2 F (36.8 C) (Oral)  Resp 20  Ht '5\' 7"'  (1.702 m)  Wt 149 lb 14.6 oz (68 kg)  BMI 23.47 kg/m2  SpO2 98%  Discharge Labs:  Results for orders placed or performed during the hospital encounter of 01/15/14 (from the past 24 hour(s))  Troponin I (q 6hr x 3)     Status: None   Collection Time: 01/16/14  4:10 PM  Result Value Ref Range   Troponin I <0.30 <0.30 ng/mL  Troponin I (q 6hr x 3)     Status: None   Collection Time: 01/16/14  8:34 PM  Result Value Ref Range   Troponin I <0.30 <0.30 ng/mL  CBC     Status: Abnormal   Collection Time: 01/17/14  1:30 AM  Result Value Ref Range   WBC 5.8 4.0 - 10.5 K/uL   RBC 4.33 4.22 - 5.81 MIL/uL   Hemoglobin 11.3 (L) 13.0 - 17.0 g/dL   HCT 35.1 (L) 39.0 - 52.0 %   MCV 81.1 78.0 - 100.0 fL   MCH 26.1 26.0 - 34.0 pg   MCHC 32.2 30.0 - 36.0  g/dL   RDW 16.1 (H) 11.5 - 15.5 %   Platelets 168 150 - 400 K/uL  Troponin I (q 6hr x 3)     Status: None   Collection Time: 01/17/14  1:30 AM  Result Value Ref Range   Troponin I <0.30 <0.30 ng/mL  Troponin I (q 6hr x 3)     Status: None   Collection Time: 01/17/14  7:46 AM  Result Value Ref Range   Troponin I <0.30 <0.30 ng/mL  Protime-INR     Status: Abnormal   Collection Time: 01/17/14 12:13 PM  Result Value Ref Range   Prothrombin Time 15.5 (H) 11.6 - 15.2 seconds   INR 1.21 0.00 - 1.49    Signed: DiJulious OkaMD 01/17/2014, 3:50 PM    Services  Ordered on Discharge: none Equipment Ordered on Discharge: none

## 2014-02-16 ENCOUNTER — Other Ambulatory Visit: Payer: Self-pay

## 2014-02-16 ENCOUNTER — Encounter (HOSPITAL_COMMUNITY): Payer: Self-pay

## 2014-02-16 ENCOUNTER — Emergency Department (HOSPITAL_COMMUNITY): Payer: Self-pay

## 2014-02-16 ENCOUNTER — Emergency Department (HOSPITAL_COMMUNITY)
Admission: EM | Admit: 2014-02-16 | Discharge: 2014-02-16 | Disposition: A | Discharge status: Home / Self Care | Payer: Self-pay | Attending: Emergency Medicine | Admitting: Emergency Medicine

## 2014-02-16 DIAGNOSIS — R0602 Shortness of breath: Secondary | ICD-10-CM | POA: Insufficient documentation

## 2014-02-16 DIAGNOSIS — Z86711 Personal history of pulmonary embolism: Secondary | ICD-10-CM | POA: Insufficient documentation

## 2014-02-16 DIAGNOSIS — Z7901 Long term (current) use of anticoagulants: Secondary | ICD-10-CM | POA: Insufficient documentation

## 2014-02-16 DIAGNOSIS — Z86718 Personal history of other venous thrombosis and embolism: Secondary | ICD-10-CM | POA: Insufficient documentation

## 2014-02-16 DIAGNOSIS — R079 Chest pain, unspecified: Secondary | ICD-10-CM | POA: Insufficient documentation

## 2014-02-16 DIAGNOSIS — Z720 Tobacco use: Secondary | ICD-10-CM | POA: Insufficient documentation

## 2014-02-16 LAB — CBC WITH DIFFERENTIAL/PLATELET
Basophils Absolute: 0 10*3/uL (ref 0.0–0.1)
Basophils Relative: 0 % (ref 0–1)
EOS ABS: 0 10*3/uL (ref 0.0–0.7)
Eosinophils Relative: 0 % (ref 0–5)
HCT: 34.7 % — ABNORMAL LOW (ref 39.0–52.0)
Hemoglobin: 11.5 g/dL — ABNORMAL LOW (ref 13.0–17.0)
Lymphocytes Relative: 19 % (ref 12–46)
Lymphs Abs: 1.4 10*3/uL (ref 0.7–4.0)
MCH: 25.9 pg — AB (ref 26.0–34.0)
MCHC: 33.1 g/dL (ref 30.0–36.0)
MCV: 78.2 fL (ref 78.0–100.0)
Monocytes Absolute: 0.5 10*3/uL (ref 0.1–1.0)
Monocytes Relative: 6 % (ref 3–12)
NEUTROS PCT: 75 % (ref 43–77)
Neutro Abs: 5.4 10*3/uL (ref 1.7–7.7)
PLATELETS: 186 10*3/uL (ref 150–400)
RBC: 4.44 MIL/uL (ref 4.22–5.81)
RDW: 16.2 % — ABNORMAL HIGH (ref 11.5–15.5)
WBC: 7.3 10*3/uL (ref 4.0–10.5)

## 2014-02-16 LAB — COMPREHENSIVE METABOLIC PANEL
ALT: 18 U/L (ref 0–53)
AST: 25 U/L (ref 0–37)
Albumin: 3.6 g/dL (ref 3.5–5.2)
Alkaline Phosphatase: 60 U/L (ref 39–117)
Anion gap: 14 (ref 5–15)
BUN: 13 mg/dL (ref 6–23)
CO2: 26 mEq/L (ref 19–32)
Calcium: 8.8 mg/dL (ref 8.4–10.5)
Chloride: 105 mEq/L (ref 96–112)
Creatinine, Ser: 1.01 mg/dL (ref 0.50–1.35)
GFR calc Af Amer: >90 mL/min (ref >90)
GFR calc non Af Amer: 85 mL/min — ABNORMAL LOW (ref >90)
Glucose, Bld: 81 mg/dL (ref 70–99)
Potassium: 3.7 mEq/L (ref 3.7–5.3)
SODIUM: 145 meq/L (ref 137–147)
TOTAL PROTEIN: 6.5 g/dL (ref 6.0–8.3)
Total Bilirubin: 0.3 mg/dL (ref 0.3–1.2)

## 2014-02-16 LAB — I-STAT TROPONIN, ED
TROPONIN I, POC: 0 ng/mL (ref 0.00–0.08)
Troponin i, poc: 0 ng/mL (ref 0.00–0.08)

## 2014-02-16 MED ORDER — ONDANSETRON HCL 4 MG/2ML IJ SOLN
4.0000 mg | Freq: Once | INTRAMUSCULAR | Status: AC
  Administered 2014-02-16: 4 mg via INTRAVENOUS
  Filled 2014-02-16: qty 2

## 2014-02-16 MED ORDER — RIVAROXABAN 20 MG PO TABS
20.0000 mg | ORAL_TABLET | Freq: Once | ORAL | Status: AC
  Administered 2014-02-16: 20 mg via ORAL
  Filled 2014-02-16: qty 1

## 2014-02-16 MED ORDER — RIVAROXABAN 20 MG PO TABS: 20.0000 mg | ORAL_TABLET | Freq: Every day | ORAL | Status: DC

## 2014-02-16 MED ORDER — MORPHINE SULFATE 4 MG/ML IJ SOLN
4.0000 mg | Freq: Once | INTRAMUSCULAR | Status: AC
  Administered 2014-02-16: 4 mg via INTRAVENOUS
  Filled 2014-02-16: qty 1

## 2014-02-16 MED ORDER — IOHEXOL 350 MG/ML SOLN
80.0000 mL | Freq: Once | INTRAVENOUS | Status: AC | PRN
  Administered 2014-02-16: 80 mL via INTRAVENOUS

## 2014-02-16 NOTE — ED Provider Notes (Signed)
CSN: [id number]     Arrival date & time 02/16/14  1157 History   First MD Initiated Contact with Patient 02/16/14 1157     Chief Complaint  Patient presents with  . Shortness of Breath  . Chest Pain     (Consider location/radiation/quality/duration/timing/severity/associated sxs/prior Treatment) HPI Peter Washington is a 48 y.o. male who presents to emergency department complaining of left-sided chest pain and shortness of breath. Patient states that he was walking and suddenly developed onset of sharp pain in left chest. States pain with deep breathing. Feeling short of breath. History of the same, diagnosed with pulmonary embolism in 2007 and again just 1 month ago. He was admitted to the hospital at that time and started on Xarelto. States that he ran out of Xarelto approximately 4 days ago. States he cannot afford it because his $300. He denies any pain in his legs. No fever or chills. No history of coronary disease. No recent travel or surgeries. No cough.  No other complaints.  Past Medical History  Diagnosis Date  . PE (pulmonary embolism)   . DVT of lower extremity (deep venous thrombosis)    History reviewed. No pertinent past surgical history. History reviewed. No pertinent family history. History  Substance Use Topics  . Smoking status: Current Every Day Smoker -- 0.50 packs/day    Types: Cigarettes  . Smokeless tobacco: Not on file  . Alcohol Use: 1.8 oz/week    3 Cans of beer per week     Comment:  cans week     Review of Systems  Constitutional: Negative for fever and chills.  Respiratory: Positive for chest tightness and shortness of breath. Negative for cough.   Cardiovascular: Positive for chest pain. Negative for palpitations and leg swelling.  Gastrointestinal: Negative for nausea, vomiting, abdominal pain, diarrhea and abdominal distention.  Genitourinary: Negative for dysuria, urgency, frequency and hematuria.  Musculoskeletal: Negative for myalgias, arthralgias,  neck pain and neck stiffness.  Skin: Negative for rash.  Allergic/Immunologic: Negative for immunocompromised state.  Neurological: Negative for dizziness, weakness, light-headedness, numbness and headaches.  All other systems reviewed and are negative.     Allergies  Review of patient's allergies indicates no known allergies.  Home Medications   Prior to Admission medications   Medication Sig Start Date End Date Taking? Authorizing Provider  Rivaroxaban (XARELTO) 15 MG TABS tablet Take 1 tablet (15 mg total) by mouth 2 (two) times daily with a meal. 01/17/14   Julious Oka, MD  rivaroxaban (XARELTO) 20 MG TABS tablet Take 1 tablet (20 mg total) by mouth daily with supper. 01/17/14   Julious Oka, MD   BP 128/72 mmHg  Pulse 60  Temp(Src) 98.6 F (37 C) (Oral)  Resp 12  Ht _0  (1.676 m)  Wt 149 lb (67.586 kg)  BMI 24.06 kg/m2  SpO2 99% Physical Exam  Constitutional: He appears well-developed and well-nourished. No distress.  HENT:  Head: Normocephalic and atraumatic.  Eyes: Conjunctivae are normal.  Neck: Neck supple.  Cardiovascular: Normal rate, regular rhythm and normal heart sounds.   Pulmonary/Chest: Effort normal. No respiratory distress. He has no wheezes. He has no rales. He exhibits no tenderness.  Abdominal: Soft. Bowel sounds are normal. He exhibits no distension. There is no tenderness. There is no rebound.  Musculoskeletal: He exhibits no edema.  Neurological: He is alert.  Skin: Skin is warm and dry.  Nursing note and vitals reviewed.   ED Course  Procedures (including critical care time) Labs Review Labs  Reviewed  CBC WITH DIFFERENTIAL - Abnormal; Notable for the following:    Hemoglobin 11.5 (*)    HCT 34.7 (*)    MCH 25.9 (*)    RDW 16.2 (*)    All other components within normal limits  COMPREHENSIVE METABOLIC PANEL - Abnormal; Notable for the following:    GFR calc non Af Amer 85 (*)    All other components within normal limits  I-STAT  TROPOININ, ED  I-STAT TROPOININ, ED    Imaging Review Ct Angio Chest Pe W/cm &/or Wo Cm  02/16/2014   CLINICAL DATA:  Left-sided chest pain, history of pulmonary embolism diagnosed 01/16/2014, currently on anticoagulation. Protein C deficiency.  EXAM: CT ANGIOGRAPHY CHEST WITH CONTRAST  TECHNIQUE: Multidetector CT imaging of the chest was performed using the standard protocol during bolus administration of intravenous contrast. Multiplanar CT image reconstructions and MIPs were obtained to evaluate the vascular anatomy.  CONTRAST:  43m OMNIPAQUE IOHEXOL 350 MG/ML SOLN  COMPARISON:  01/16/2014 and 12/29/2013 chest CT  FINDINGS: Thin linear residual pulmonary embolism identified, for example right lower lobe image 54. No new focal filling defect up to and including the third order pulmonary arteries is identified. Heart size is normal. The RV: LV ratio measures 1.05, compatible with mild right heart strain. This is stable. Heart size at upper limits of normal. No lymphadenopathy. No pericardial or pleural effusion. Minimal dependent atelectasis is noted. 4 mm left lower lobe pulmonary nodule is stable. Central airways are patent. No acute osseous finding.  Review of the MIP images confirms the above findings.  IMPRESSION: Near complete resolution of previously seen pulmonary emboli, with thin linear residual chronic thrombus in the predominantly right lower lobe pulmonary arteries as above.  Stable degree of mild right heart strain.  Followup chest CT is again recommended October 2016 for reassessment of a left lower lobe pulmonary nodule.   Electronically Signed   By: GConchita ParisM.D.   On: 02/16/2014 14:37     Date: 02/16/2014  Rate: 63  Rhythm: normal sinus rhythm  QRS Axis: normal  Intervals: normal  ST/T Wave abnormalities: normal  Conduction Disutrbances:none  Narrative Interpretation:   Old EKG Reviewed: unchanged    MDM   Final diagnoses:  Chest pain    Patient with history of  PEs, lost one month ago. He was on Xarelto but ran out 4 days ago. States pain was gone until just prior to their arrival. He is reporting left-sided chest pain or shortness of breath. Her knee PE. Will get labs and CT angiography.  2:44 PM CT angion negative for an acute clot. Xarelto dose ordered here. Spoke with case management, WMariann Laster will come by speak with pt. Pt apparently did met with social worker prior to discharge, and was scheduled an apt and given vouchers for xarelto, which pt denied to me.   3:41 PM Delta troponin negative. Case manager spoke with pt, set him up for another apt for next week. Also given him 110monthupply of xarelto. Home with follow up next week. VS normal here.   Filed Vitals:   02/16/14 1330 02/16/14 1345 02/16/14 1418 02/16/14 1500  BP: 124/62 117/64 120/64 121/59  Pulse: 62 61 59 64  Temp:      TempSrc:      Resp: _0 Height:      Weight:      SpO2: 99% 98% 100% 99%     TaRenold GentaPA-C 02/16/14 1543  KrCyril Mourning  [place], DO 02/16/14 8102

## 2014-02-16 NOTE — ED Notes (Signed)
To room via EMS.  Pt walking down street started getting short of breath and left sided chest pain, worse when taking deep breaths.  8/10 on pain scale.  PE 1 month ago started on Xarelto, last dose yesterday.

## 2014-02-16 NOTE — Discharge Instructions (Signed)
Follow up with [facility] next week. Continue xarelto. Take tylenol for pain. Return if any issues.    Pulmonary Embolism A pulmonary (lung) embolism (PE) is a blood clot that has traveled to the lung and results in a blockage of blood flow in the affected lung. Most clots come from deep veins in the legs or pelvis. PE is a dangerous and potentially life-threatening condition that can be treated if identified. CAUSES Blood clots form in a vein for different reasons. Usually several things cause blood clots. They include:  The flow of blood slows down.  The inside of the vein is damaged in some way.  The person has a condition that makes the blood clot more easily. RISK FACTORS Some people are more likely than others to develop PE. Risk factors include:   Smoking.  Being overweight (obese).  Sitting or lying still for a long time. This includes long-distance travel, paralysis, or recovery from an illness or surgery. Other factors that increase risk are:   Older age, especially over 50 years of age.  Having a family history of blood clots or if you have already had a blood clot.  Having major or lengthy surgery. This is especially true for surgery on the hip, knee, or belly (abdomen). Hip surgery is particularly high risk.  Having a long, thin tube (catheter) placed inside a vein during a medical procedure.  Breaking a hip or leg.  Having cancer or cancer treatment.  Medicines containing the male hormone estrogen. This includes birth control pills and hormone replacement therapy.  Other circulation or heart problems.  Pregnancy and childbirth.  Hormone changes make the blood clot more easily during pregnancy.  The fetus puts pressure on the veins of the pelvis.  There is a risk of injury to veins during delivery or a caesarean delivery. The risk is highest just after childbirth.  PREVENTION   Exercise the legs regularly. Take a brisk 30 minute walk every  day.  Maintain a weight that is appropriate for your height.  Avoid sitting or lying in bed for long periods of time without moving your legs.  Women, particularly those over the age of 35 years, should consider the risks and benefits of taking estrogen medicines, including birth control pills.  Do not smoke, especially if you take estrogen medicines.  Long-distance travel can increase your risk. You should exercise your legs by walking or pumping the muscles every hour.  Many of the risk factors above relate to situations that exist with hospitalization, either for illness, injury, or elective surgery. Prevention may include medical and nonmedical measures.   Your health care provider will assess you for the need for venous thromboembolism prevention when you are admitted to the hospital. If you are having surgery, your surgeon will assess you the day of or day after surgery.  SYMPTOMS  The symptoms of a PE usually start suddenly and include:  Shortness of breath.  Coughing.  Coughing up blood or blood-tinged mucus.  Chest pain. Pain is often worse with deep breaths.  Rapid heartbeat. DIAGNOSIS  If a PE is suspected, your health care provider will take a medical history and perform a physical exam. Other tests that may be required include:  Blood tests, such as studies of the clotting properties of your blood.  Imaging tests, such as ultrasound, CT, MRI, and other tests to see if you have clots in your legs or lungs.  An electrocardiogram. This can look for heart strain from blood clots  in the lungs. TREATMENT   The most common treatment for a PE is blood thinning (anticoagulant) medicine, which reduces the blood's tendency to clot. Anticoagulants can stop new blood clots from forming and old clots from growing. They cannot dissolve existing clots. Your body does this by itself over time. Anticoagulants can be given by mouth, through an intravenous (IV) tube, or by injection.  Your health care provider will determine the best program for you.  Less commonly, clot-dissolving medicines (thrombolytics) are used to dissolve a PE. They carry a high risk of bleeding, so they are used mainly in severe cases.  Very rarely, a blood clot in the leg needs to be removed surgically.  If you are unable to take anticoagulants, your health care provider may arrange for you to have a filter placed in a main vein in your abdomen. This filter prevents clots from traveling to your lungs. HOME CARE INSTRUCTIONS   Take all medicines as directed by your health care provider.  Learn as much as you can about DVT.  Wear a medical alert bracelet or carry a medical alert card.  Ask your health care provider how soon you can go back to normal activities. It is important to stay active to prevent blood clots. If you are on anticoagulant medicine, avoid contact sports.  It is very important to exercise. This is especially important while traveling, sitting, or standing for long periods of time. Exercise your legs by walking or by tightening and relaxing your leg muscles regularly. Take frequent walks.  You may need to wear compression stockings. These are tight elastic stockings that apply pressure to the lower legs. This pressure can help keep the blood in the legs from clotting. Taking Warfarin Warfarin is a daily medicine that is taken by mouth. Your health care provider will advise you on the length of treatment (usually 3-6 months, sometimes lifelong). If you take warfarin:  Understand how to take warfarin and foods that can affect how warfarin works in Public relations account executive.  Too much and too little warfarin are both dangerous. Too much warfarin increases the risk of bleeding. Too little warfarin continues to allow the risk for blood clots. Warfarin and Regular Blood Testing While taking warfarin, you will need to have regular blood tests to measure your blood clotting time. These blood tests  usually include both the prothrombin time (PT) and international normalized ratio (INR) tests. The PT and INR results allow your health care provider to adjust your dose of warfarin. It is very important that you have your PT and INR tested as often as directed by your health care provider.  Warfarin and Your Diet Avoid major changes in your diet, or notify your health care provider before changing your diet. Arrange a visit with a registered dietitian to answer your questions. Many foods, especially foods high in vitamin K, can interfere with warfarin and affect the PT and INR results. You should eat a consistent amount of foods high in vitamin K. Foods high in vitamin K include:   Spinach, kale, broccoli, cabbage, collard and turnip greens, Brussels sprouts, peas, cauliflower, seaweed, and parsley.  Beef and pork liver.  Green tea.  Soybean oil. Warfarin with Other Medicines Many medicines can interfere with warfarin and affect the PT and INR results. You must:  Tell your health care provider about any and all medicines, vitamins, and supplements you take, including aspirin and other over-the-counter anti-inflammatory medicines. Be especially cautious with aspirin and anti-inflammatory medicines. Ask your health  care provider before taking these.  Do not take or discontinue any prescribed or over-the-counter medicine except on the advice of your health care provider or pharmacist. Warfarin Side Effects Warfarin can have side effects, such as easy bruising and difficulty stopping bleeding. Ask your health care provider or pharmacist about other side effects of warfarin. You will need to:  Hold pressure over cuts for longer than usual.  Notify your dentist and other health care providers that you are taking warfarin before you undergo any procedures where bleeding may occur. Warfarin with Alcohol and Tobacco   Drinking alcohol frequently can increase the effect of warfarin, leading to excess  bleeding. It is best to avoid alcoholic drinks or consume only very small amounts while taking warfarin. Notify your health care provider if you change your alcohol intake.  Do not use any tobacco products including cigarettes, chewing tobacco, or electronic cigarettes. If you smoke, quit. Ask your health care provider for help with quitting smoking. Alternative Medicines to Warfarin: Factor Xa Inhibitor Medicines  These blood thinning medicines are taken by mouth, usually for several weeks or longer. It is important to take the medicine every single day, at the same time each day.  There are no regular blood tests required when using these medicines.  There are fewer food and drug interactions than with warfarin.  The side effects of this class of medicine is similar to that of warfarin, including excessive bruising or bleeding. Ask your health care provider or pharmacist about other potential side effects. SEEK MEDICAL CARE IF:   You notice a rapid heartbeat.  You feel weaker or more tired than usual.  You feel faint.  You notice increased bruising.  Your symptoms are not getting better in the time expected.  You are having side effects of medicine. SEEK IMMEDIATE MEDICAL CARE IF:   You have chest pain.  You have trouble breathing.  You have new or increased swelling or pain in one leg.  You cough up blood.  You notice blood in vomit, in a bowel movement, or in urine.  You have a fever. Symptoms of PE may represent a serious problem that is an emergency. Do not wait to see if the symptoms will go away. Get medical help right away. Call your local emergency services (911 in the [place] Do not drive yourself to the hospital. Document Released: 02/27/2000 Document Revised: 07/16/2013 Document Reviewed: 03/12/2013 [facility] Patient Information 2015 [place], [place]. This information is not intended to replace advice given to you by your health care provider. Make sure  you discuss any questions you have with your health care provider.

## 2014-02-16 NOTE — ED Notes (Signed)
Pt returned from CT without distress

## 2014-02-16 NOTE — ED Notes (Signed)
Patient transported to CT 

## 2014-02-20 ENCOUNTER — Other Ambulatory Visit: Payer: Self-pay | Admitting: Emergency Medicine

## 2014-02-20 MED ORDER — RIVAROXABAN 20 MG PO TABS
20.0000 mg | ORAL_TABLET | Freq: Every day | ORAL | Status: DC
Start: 2014-02-20 — End: 2014-02-27

## 2014-02-20 MED ORDER — RIVAROXABAN 20 MG PO TABS: 20.0000 mg | ORAL_TABLET | Freq: Every day | ORAL | Status: DC

## 2014-02-21 ENCOUNTER — Encounter (HOSPITAL_COMMUNITY): Payer: Self-pay | Admitting: Emergency Medicine

## 2014-02-21 ENCOUNTER — Emergency Department (HOSPITAL_COMMUNITY)
Admission: EM | Admit: 2014-02-21 | Discharge: 2014-02-21 | Disposition: A | Discharge status: Home / Self Care | Payer: MEDICAID | Attending: Emergency Medicine | Admitting: Emergency Medicine

## 2014-02-21 ENCOUNTER — Emergency Department (HOSPITAL_COMMUNITY): Payer: MEDICAID

## 2014-02-21 DIAGNOSIS — R531 Weakness: Secondary | ICD-10-CM | POA: Insufficient documentation

## 2014-02-21 DIAGNOSIS — I2699 Other pulmonary embolism without acute cor pulmonale: Secondary | ICD-10-CM | POA: Insufficient documentation

## 2014-02-21 DIAGNOSIS — H538 Other visual disturbances: Secondary | ICD-10-CM | POA: Insufficient documentation

## 2014-02-21 DIAGNOSIS — R079 Chest pain, unspecified: Secondary | ICD-10-CM

## 2014-02-21 DIAGNOSIS — Z86718 Personal history of other venous thrombosis and embolism: Secondary | ICD-10-CM | POA: Insufficient documentation

## 2014-02-21 DIAGNOSIS — R200 Anesthesia of skin: Secondary | ICD-10-CM | POA: Insufficient documentation

## 2014-02-21 DIAGNOSIS — R600 Localized edema: Secondary | ICD-10-CM | POA: Insufficient documentation

## 2014-02-21 DIAGNOSIS — R0602 Shortness of breath: Secondary | ICD-10-CM

## 2014-02-21 DIAGNOSIS — R51 Headache: Secondary | ICD-10-CM | POA: Insufficient documentation

## 2014-02-21 DIAGNOSIS — Z7901 Long term (current) use of anticoagulants: Secondary | ICD-10-CM | POA: Insufficient documentation

## 2014-02-21 DIAGNOSIS — Z720 Tobacco use: Secondary | ICD-10-CM | POA: Insufficient documentation

## 2014-02-21 LAB — CBC
HCT: 36.3 % — ABNORMAL LOW (ref 39.0–52.0)
HEMOGLOBIN: 12 g/dL — AB (ref 13.0–17.0)
MCH: 26.3 pg (ref 26.0–34.0)
MCHC: 33.1 g/dL (ref 30.0–36.0)
MCV: 79.6 fL (ref 78.0–100.0)
Platelets: 193 10*3/uL (ref 150–400)
RBC: 4.56 MIL/uL (ref 4.22–5.81)
RDW: 16 % — AB (ref 11.5–15.5)
WBC: 7.4 10*3/uL (ref 4.0–10.5)

## 2014-02-21 LAB — BASIC METABOLIC PANEL
Anion gap: 13 (ref 5–15)
BUN: 11 mg/dL (ref 6–23)
CHLORIDE: 104 meq/L (ref 96–112)
CO2: 27 meq/L (ref 19–32)
CREATININE: 0.95 mg/dL (ref 0.50–1.35)
Calcium: 9.2 mg/dL (ref 8.4–10.5)
GFR calc non Af Amer: >90 mL/min (ref >90)
GLUCOSE: 97 mg/dL (ref 70–99)
POTASSIUM: 3.5 meq/L — AB (ref 3.7–5.3)
Sodium: 144 mEq/L (ref 137–147)

## 2014-02-21 LAB — I-STAT TROPONIN, ED: TROPONIN I, POC: 0 ng/mL (ref 0.00–0.08)

## 2014-02-21 LAB — PRO B NATRIURETIC PEPTIDE: Pro B Natriuretic peptide (BNP): 175.6 pg/mL — ABNORMAL HIGH (ref 0–125)

## 2014-02-21 MED ORDER — HYDROCODONE-ACETAMINOPHEN 5-325 MG PO TABS
2.0000 | ORAL_TABLET | Freq: Once | ORAL | Status: AC
  Administered 2014-02-21: 2 via ORAL
  Filled 2014-02-21: qty 2

## 2014-02-21 MED ORDER — HYDROCODONE-ACETAMINOPHEN 5-325 MG PO TABS: 1.0000 | ORAL_TABLET | Freq: Two times a day (BID) | ORAL | Status: DC | PRN

## 2014-02-21 NOTE — Discharge Instructions (Signed)
Pulmonary Embolism Peter [place], you were seen today for chest pain. 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 These blood clots can be life-threatening. Follow-up with a primary care physician within 3 days for continued management. If symptoms worsen come back to emergency department immediately for repeat evaluation.Thank you. A pulmonary (lung) embolism (PE) is a blood clot that has traveled to the lung and results in a blockage of blood flow in the affected lung. Most clots come from deep veins in the legs or pelvis. PE is a dangerous and potentially life-threatening condition that can be treated if identified. CAUSES Blood clots form in a vein for different reasons. Usually several things cause blood clots. They include:  The flow of blood slows down.  The inside of the vein is damaged in some way.  The person has a condition that makes the blood clot more easily. RISK FACTORS Some people are more likely than others to develop PE. Risk factors include:   Smoking.  Being overweight (obese).  Sitting or lying still for a long time. This includes long-distance travel, paralysis, or recovery from an illness or surgery. Other factors that increase risk are:   Older age, especially over 4 years of age.  Having a family history of blood clots or if you have already had a blood clot.  Having major or lengthy surgery. This is especially true for surgery on the hip, knee, or belly (abdomen). Hip surgery is particularly high risk.  Having a long, thin tube (catheter) placed inside a vein during a medical procedure.  Breaking a hip or leg.  Having cancer or cancer treatment.  Medicines containing the male hormone estrogen. This includes birth control pills and hormone replacement therapy.  Other circulation or heart problems.  Pregnancy and childbirth.  Hormone changes make the blood clot more easily during pregnancy.  The fetus  puts pressure on the veins of the pelvis.  There is a risk of injury to veins during delivery or a caesarean delivery. The risk is highest just after childbirth.  PREVENTION   Exercise the legs regularly. Take a brisk 30 minute walk every day.  Maintain a weight that is appropriate for your height.  Avoid sitting or lying in bed for long periods of time without moving your legs.  Women, particularly those over the age of 35 years, should consider the risks and benefits of taking estrogen medicines, including birth control pills.  Do not smoke, especially if you take estrogen medicines.  Long-distance travel can increase your risk. You should exercise your legs by walking or pumping the muscles every hour.  Many of the risk factors above relate to situations that exist with hospitalization, either for illness, injury, or elective surgery. Prevention may include medical and nonmedical measures.   Your health care provider will assess you for the need for venous thromboembolism prevention when you are admitted to the hospital. If you are having surgery, your surgeon will assess you the day of or day after surgery.  SYMPTOMS  The symptoms of a PE usually start suddenly and include:  Shortness of breath.  Coughing.  Coughing up blood or blood-tinged mucus.  Chest pain. Pain is often worse with deep breaths.  Rapid heartbeat. DIAGNOSIS  If a PE is suspected, your health care provider will take a medical history and perform a physical exam. Other tests that may be required include:  Blood tests, such as studies of the clotting  properties of your blood.  Imaging tests, such as ultrasound, CT, MRI, and other tests to see if you have clots in your legs or lungs.  An electrocardiogram. This can look for heart strain from blood clots in the lungs. TREATMENT   The most common treatment for a PE is blood thinning (anticoagulant) medicine, which reduces the blood's tendency to clot.  Anticoagulants can stop new blood clots from forming and old clots from growing. They cannot dissolve existing clots. Your body does this by itself over time. Anticoagulants can be given by mouth, through an intravenous (IV) tube, or by injection. Your health care provider will determine the best program for you.  Less commonly, clot-dissolving medicines (thrombolytics) are used to dissolve a PE. They carry a high risk of bleeding, so they are used mainly in severe cases.  Very rarely, a blood clot in the leg needs to be removed surgically.  If you are unable to take anticoagulants, your health care provider may arrange for you to have a filter placed in a main vein in your abdomen. This filter prevents clots from traveling to your lungs. HOME CARE INSTRUCTIONS   Take all medicines as directed by your health care provider.  Learn as much as you can about DVT.  Wear a medical alert bracelet or carry a medical alert card.  Ask your health care provider how soon you can go back to normal activities. It is important to stay active to prevent blood clots. If you are on anticoagulant medicine, avoid contact sports.  It is very important to exercise. This is especially important while traveling, sitting, or standing for long periods of time. Exercise your legs by walking or by tightening and relaxing your leg muscles regularly. Take frequent walks.  You may need to wear compression stockings. These are tight elastic stockings that apply pressure to the lower legs. This pressure can help keep the blood in the legs from clotting. Taking Warfarin Warfarin is a daily medicine that is taken by mouth. Your health care provider will advise you on the length of treatment (usually 3-6 months, sometimes lifelong). If you take warfarin:  Understand how to take warfarin and foods that can affect how warfarin works in Public relations account executive.  Too much and too little warfarin are both dangerous. Too much warfarin increases  the risk of bleeding. Too little warfarin continues to allow the risk for blood clots. Warfarin and Regular Blood Testing While taking warfarin, you will need to have regular blood tests to measure your blood clotting time. These blood tests usually include both the prothrombin time (PT) and international normalized ratio (INR) tests. The PT and INR results allow your health care provider to adjust your dose of warfarin. It is very important that you have your PT and INR tested as often as directed by your health care provider.  Warfarin and Your Diet Avoid major changes in your diet, or notify your health care provider before changing your diet. Arrange a visit with a registered dietitian to answer your questions. Many foods, especially foods high in vitamin K, can interfere with warfarin and affect the PT and INR results. You should eat a consistent amount of foods high in vitamin K. Foods high in vitamin K include:   Spinach, kale, broccoli, cabbage, collard and turnip greens, Brussels sprouts, peas, cauliflower, seaweed, and parsley.  Beef and pork liver.  Green tea.  Soybean oil. Warfarin with Other Medicines Many medicines can interfere with warfarin and affect the PT and  INR results. You must:  Tell your health care provider about any and all medicines, vitamins, and supplements you take, including aspirin and other over-the-counter anti-inflammatory medicines. Be especially cautious with aspirin and anti-inflammatory medicines. Ask your health care provider before taking these.  Do not take or discontinue any prescribed or over-the-counter medicine except on the advice of your health care provider or pharmacist. Warfarin Side Effects Warfarin can have side effects, such as easy bruising and difficulty stopping bleeding. Ask your health care provider or pharmacist about other side effects of warfarin. You will need to:  Hold pressure over cuts for longer than usual.  Notify your  dentist and other health care providers that you are taking warfarin before you undergo any procedures where bleeding may occur. Warfarin with Alcohol and Tobacco   Drinking alcohol frequently can increase the effect of warfarin, leading to excess bleeding. It is best to avoid alcoholic drinks or consume only very small amounts while taking warfarin. Notify your health care provider if you change your alcohol intake.  Do not use any tobacco products including cigarettes, chewing tobacco, or electronic cigarettes. If you smoke, quit. Ask your health care provider for help with quitting smoking. Alternative Medicines to Warfarin: Factor Xa Inhibitor Medicines  These blood thinning medicines are taken by mouth, usually for several weeks or longer. It is important to take the medicine every single day, at the same time each day.  There are no regular blood tests required when using these medicines.  There are fewer food and drug interactions than with warfarin.  The side effects of this class of medicine is similar to that of warfarin, including excessive bruising or bleeding. Ask your health care provider or pharmacist about other potential side effects. SEEK MEDICAL CARE IF:   You notice a rapid heartbeat.  You feel weaker or more tired than usual.  You feel faint.  You notice increased bruising.  Your symptoms are not getting better in the time expected.  You are having side effects of medicine. SEEK IMMEDIATE MEDICAL CARE IF:   You have chest pain.  You have trouble breathing.  You have new or increased swelling or pain in one leg.  You cough up blood.  You notice blood in vomit, in a bowel movement, or in urine.  You have a fever. Symptoms of PE may represent a serious problem that is an emergency. Do not wait to see if the symptoms will go away. Get medical help right away. Call your local emergency services (911 in the [place]). Do not drive yourself to the  hospital. Document Released: 02/27/2000 Document Revised: 07/16/2013 Document Reviewed: 03/12/2013 [facility] Patient Information 2015 [place], [place]. This information is not intended to replace advice given to you by your health care provider. Make sure you discuss any questions you have with your health care provider.  Emergency Department Resource Guide 1) Find a Doctor and Pay Out of Pocket Although you won't have to find out who is covered by your insurance plan, it is a good idea to ask around and get recommendations. You will then need to call the office and see if the doctor you have chosen will accept you as a new patient and what types of options they offer for patients who are self-pay. Some doctors offer discounts or will set up payment plans for their patients who do not have insurance, but you will need to ask so you aren't surprised when you get to your appointment.  2) Contact  Your Local Health Department Not all health departments have doctors that can see patients for sick visits, but many do, so it is worth a call to see if yours does. If you don't know where your local health department is, you can check in your phone book. The CDC also has a tool to help you locate your state's health department, and many state websites also have listings of all of their local health departments.  3) Find a Walk-in Clinic If your illness is not likely to be very severe or complicated, you may want to try a walk in clinic. These are popping up all over the country in pharmacies, drugstores, and shopping centers. They're usually staffed by nurse practitioners or physician assistants that have been trained to treat common illnesses and complaints. They're usually fairly quick and inexpensive. However, if you have serious medical issues or chronic medical problems, these are probably not your best option.  No Primary Care Doctor: - Call Health Connect at  [phone] - they can help you locate a primary  care doctor that  accepts your insurance, provides certain services, etc. - Physician Referral Service- [phone]  Chronic Pain Problems: Organization         Address  Phone   Notes  [facility]  [phone] Patients need to be referred by their primary care doctor.   Medication Assistance: Organization         Address  Phone   Notes  [facility] [address] [place], [place] [address] [phone] --Must be a resident of [facility] -- Must have NO insurance coverage whatsoever (no Medicaid/ Medicare, etc.) -- The pt. MUST have a primary care doctor that directs their care regularly and follows them in the community   MedAssist  [phone]) [phone]   [employer]  [phone]) [phone]    Agencies that provide inexpensive medical care: Organization         Address  Phone   Notes  [facility]  [phone]   [facility]    [phone]   [facility] [address] [place], [place] [address] [phone]   [facility] [address], [place] [phone]   Planned Parenthood    [phone]   [facility]    [phone]   [facility]  [address], [place] Phone:  [phone], Fax:  [phone]) [phone] Hours of Operation:  9 am - 6 pm, M-F.  Also accepts Medicaid/Medicare and self-pay.  [facility]  [address], [place] Phone: [phone]) [phone], Fax: [phone]. Hours of Operation:  8:30 am - 5:30 pm, M-F.  Also accepts Medicaid and self-pay.  [facility] [place] [address], [place] Phone: [phone]   [facility] [address], [place], [place] [phone], Ext. [phone] Mondays & Thursdays: 7-9 AM.  First 15 patients are seen on a first come, first serve basis.    Medicaid-accepting [facility]  Providers:  Organization         Address  Phone   Notes  [facility] [address], [place] [phone]) [phone] Also accepts self-pay patients.  [facility] [address], [place]  [phone]) [phone]   [facility] [address], [address] [place] (  [phone]   [facility] [address], [place] [phone]   Renaye Rakers [address], [place]   [phone] Only accepts [place] Access [place] patients after they have their name applied to their card.   Self-Pay (no insurance) in [facility]:  Organization         Address  Phone   Notes  Sickle Cell Patients, [facility] [address] [place], [place] [phone]   [facility] [address] [place], [place] [phone]   [facility] [place]  [address], [place] [phone]   [facility]/Dr. Osei-Bonsu  [address] [place] or [address], [place] [phone] Phone number for both [place] and [place] locations is the same.  [facility] [address] [place] [phone]   [facility] [address] [place] or [address] [phone]   [facility] [address], [place] [phone], phone; [phone], fax Sees patients 1st and 3rd Saturday of every month.  Must not qualify for public or private insurance (i.e. Medicaid, Medicare, [place] Health Choice, Veterans' Benefits)  Household income should be no more than 200% of the poverty level The clinic cannot treat you if you are pregnant or think you are pregnant  Sexually transmitted diseases are not treated at the clinic.    Dental Care: Organization         Address  Phone  Notes  [facility] Department of [facility] [address] [place], [place] [phone] Accepts children up to age 64 who are enrolled in [place] or [place] Health Choice; pregnant women with a Medicaid card; and children who have applied for Medicaid or [place] Health Choice, but were declined, whose parents can pay a reduced fee at time of service.  [facility] Department of [facility]  [address] [place] [address], [place] [phone] Accepts children up to age 63 who are enrolled in [place] or [place] Health Choice; pregnant women with a Medicaid card; and children who have applied for Medicaid or [place] Health Choice, but were declined, whose parents can pay a reduced fee at time of service.  [facility]  [address] [place], [place] [phone] Patients are seen by appointment only. Walk-ins are not accepted. [facility] will see patients 68 years of age and older. Monday - Tuesday (8am-5pm) Most Wednesdays (8:30-5pm) $30 per visit, cash only  [facility] Access [facility]  [address], [facility] [phone] Patients are seen by appointment only. Walk-ins are not accepted. [facility] will see patients 86 years of age and older. One Wednesday Evening (Monthly: Volunteer Based).  $30 per visit, cash only  [employer]  [phone] for adults; Children under age 27, call Graduate Pediatric Dentistry at [phone]. Children aged 40-14, please call [phone] to request a pediatric application.  Dental services are provided in all areas of dental care including fillings, crowns and bridges, complete and partial dentures, implants, gum treatment, root canals, and extractions. Preventive care is also provided. Treatment is provided to both adults and children. Patients are selected via a lottery and there is often a waiting list.   [facility] [address], Ginette Otto  [phone])  [phone] [email or web address]   [facility]  [address] [place], [place] [phone], Ext. [phone] Second and Fourth Thursday of each month, opens at 6:30 AM; Clinic ends at 9 AM.  Patients are seen on a first-come first-served basis, and a limited number are seen during each clinic.   [facility]  [address] [place], [place] [phone]   Eligibility Requirements You must have lived in [place], [place], or [place] counties for at least the last three months.   You cannot be eligible for state or federal sponsored [place], including [employer], [place], or [employer].   You generally cannot be eligible for healthcare insurance through your employer.    How to apply: Eligibility screenings are held every Tuesday and Wednesday afternoon from 1:00 pm until 4:00 pm. You do not need an appointment for the interview!  [facility] [address], [place], [place] [phone]   [facility] Health Department  [phone]   [facility] Health Department  [phone]   [facility] Health Department  [phone]    Behavioral Health Resources in the Community: Intensive Outpatient Programs Organization         Address  Phone  Notes  [facility] [address], [place], [place] [phone]   [facility]patient [address], [place], [place] [phone]   [facility] [address] [place], [place]  [phone]   [facility] [address]  [place], [place] [phone] or [phone]   Substance Abuse Resources Organization         Address  Phone  Notes  [facility] Drug Services  [phone]   [facility]  [phone]   [facility]  [phone]   Floydene Flock  [phone]   Residential & Outpatient Substance Abuse Program  [phone]   Psychological Services Organization         Address  Phone  Notes  [facility]  [phone]    [facility]  [phone]   [facility] Mental Health [address] [place] [phone] or [phone]    Mobile Crisis Teams Organization         Address  Phone  Notes  [facility], Mobile Crisis Care Unit  [phone]   [facility]  [address] [place], [place] [phone]   Doristine Locks [address] [place] [phone]    Self-Help/Support Groups Organization         Address  Phone             Notes  [facility] [place] - variety of support groups  [phone]- I7437963 Call for more information  Narcotics Anonymous (NA), [facility] [address], [employer] [place]  2 meetings at this location   Statistician         Address  Phone  Notes  [facility] [address],    [place]  [phone]   [facility]  [address], [place] [address], [place], [place] [phone]   [facility] [address] [place], [place] [phone] Admissions: 8am-3pm M-F  [facility] [address],    [place], [place] [phone]   [facility] [address] [place], [place] [phone]   [facility] [address]  [place], [place] [phone]   [facility] -  Intensive Outpatient [address], [place], [place] [phone]   [facility] ([facility] [address]  [place], [place] [phone] or [phone]   [facility] ([facility]) [address] [place], [place] [phone] Accepts Medicaid  [facility] [address]  [place] [phone] Substance Abuse/Addiction Treatment   [facility] Behavioral Health Resources Organization         Address  Phone  Notes  [facility]  [phone]) [phone]   Angie FavaJulie Brannon, PhD [address] [place], [place]   [phone]) [phone] or [phone]) [phone]    [facility]   [address] [place], [place] [phone]) [phone]   [facility] [address], [place], [place] [phone] Insurance/Medicaid/sponsorship through [facility]  [facility] [address]                                    [place], [place] [phone] Therapy/tele-psych/case  [facility] [address] [place].   [place], [place] [phone]) [phone]    Dr. Lolly MustacheArfeen  [phone]) [phone]   Free [facility]  [facility] 1) [address],  2) [address], [place] 3)  [address], [place] [phone]  [phone]   [facility] Hotline [phone]) [phone] or [phone] (After Hours)

## 2014-02-21 NOTE — ED Provider Notes (Addendum)
CSN: [id number]     Arrival date & time 02/21/14  0214 History  This chart was scribed for Tomasita Crumble, MD by Karle Plumber, ED Scribe. This patient was seen in room B17C/B17C and the patient's care was started at 2:48 AM.  Chief Complaint  Patient presents with  . Chest Pain   Patient is a 48 y.o. male presenting with chest pain. The history is provided by the patient. No language interpreter was used.  Chest Pain Associated symptoms: cough, dizziness, headache, numbness and weakness   Associated symptoms: no fever and no shortness of breath     HPI Comments:  Peter Washington is a 48 y.o. male with PMH of pulmonary embolism and DVT who presents to the Emergency Department complaining of left-sided sharp CP that began two days ago. Pt reports the pain radiates to his right shoulder and arm.  He reports associated productive cough He endorses severe HA and dizziness with blurred vision onset earlier this evening. He reports associated numbness and weakness in BUE stating it alternates between the two extremities. Pt is currently taking Xarelto and states he has had having a hard time obtaining it from the pharmacy. He currently has the medication and is taking it as prescribed. He has not taken anything for pain. He states the CP worsens when he runs out of his Xarelto. Denies alleviating factors. 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 He reports the DVT was located in his lower left posterior leg and posterior knee. Denies SOB, fever, chills, diaphoresis, abdominal pain, nausea or vomiting. Pt endorses smoking 0.5 PPD.  Past Medical History  Diagnosis Date  . PE (pulmonary embolism)   . DVT of lower extremity (deep venous thrombosis)    History reviewed. No pertinent past surgical history. No family history on file. History  Substance Use Topics  . Smoking status: Current Every Day Smoker -- 0.50 packs/day    Types:  Cigarettes  . Smokeless tobacco: Not on file  . Alcohol Use: Yes    Review of Systems  Constitutional: Negative for fever, chills and appetite change.  Eyes: Positive for visual disturbance.  Respiratory: Positive for cough. Negative for shortness of breath.   Cardiovascular: Positive for chest pain.  Neurological: Positive for dizziness, weakness, numbness and headaches.  All other systems reviewed and are negative.   Allergies  Review of patient's allergies indicates no known allergies.  Home Medications   Prior to Admission medications   Medication Sig Start Date End Date Taking? Authorizing Provider  Rivaroxaban (XARELTO) 15 MG TABS tablet Take 1 tablet (15 mg total) by mouth 2 (two) times daily with a meal. 01/17/14   Gara Kroner, MD  rivaroxaban (XARELTO) 20 MG TABS tablet Take 1 tablet (20 mg total) by mouth daily with supper. 02/20/14   Gaylord Shih, MD   Triage Vitals: BP 118/65 mmHg  Pulse 63  Temp(Src) 98.1 F (36.7 C) (Oral)  Resp 16  SpO2 100% Physical Exam  Constitutional: He is oriented to person, place, and time. Vital signs are normal. He appears well-developed and well-nourished.  Non-toxic appearance. He does not appear ill. No distress.  HENT:  Head: Normocephalic and atraumatic.  Nose: Nose normal.  Mouth/Throat: Oropharynx is clear and moist. No oropharyngeal exudate.  Eyes: Conjunctivae and EOM are normal. Pupils are equal, round, and reactive to light. No scleral icterus.  Neck: Normal range of motion. Neck supple. No tracheal  deviation, no edema, no erythema and normal range of motion present. No thyroid mass and no thyromegaly present.  Cardiovascular: Normal rate, regular rhythm, S1 normal, S2 normal, normal heart sounds, intact distal pulses and normal pulses.  Exam reveals no gallop and no friction rub.   No murmur heard. Pulses:      Radial pulses are 2+ on the right side, and 2+ on the left side.       Dorsalis pedis pulses are 2+ on the right  side, and 2+ on the left side.  Pulmonary/Chest: Effort normal and breath sounds normal. No respiratory distress. He has no wheezes. He has no rhonchi. He has no rales. He exhibits tenderness.  Reproducible, tender to palpation chest pain.  Abdominal: Soft. Normal appearance and bowel sounds are normal. He exhibits no distension, no ascites and no mass. There is no hepatosplenomegaly. There is no tenderness. There is no rebound, no guarding and no CVA tenderness.  Musculoskeletal: Normal range of motion. He exhibits edema. He exhibits no tenderness.  2+ bilateral lower extremity edema  Lymphadenopathy:    He has no cervical adenopathy.  Neurological: He is alert and oriented to person, place, and time. He has normal strength. No cranial nerve deficit or sensory deficit. He exhibits normal muscle tone. GCS eye subscore is 4. GCS verbal subscore is 5. GCS motor subscore is 6.  Normal finger nose finger. Normal strength. Sensations intact. Normal cerebellar testing.  Skin: Skin is warm, dry and intact. No petechiae and no rash noted. He is not diaphoretic. No erythema. No pallor.  Psychiatric: He has a normal mood and affect. His behavior is normal. Judgment normal.  Nursing note and vitals reviewed.   ED Course  Procedures (including critical care time) DIAGNOSTIC STUDIES: Oxygen Saturation is 100% on RA, normal by my interpretation.   COORDINATION OF CARE: 2:57 AM- Will order lab work, pain medication and CXR. Pt verbalizes understanding and agrees to plan.  Medications - No data to display  Labs Review Labs Reviewed  CBC - Abnormal; Notable for the following:    Hemoglobin 12.0 (*)    HCT 36.3 (*)    RDW 16.0 (*)    All other components within normal limits  BASIC METABOLIC PANEL - Abnormal; Notable for the following:    Potassium 3.5 (*)    All other components within normal limits  PRO B NATRIURETIC PEPTIDE  I-STAT TROPOININ, ED    Imaging Review Dg Chest 2  View  02/21/2014   CLINICAL DATA:  LEFT side chest pain today, shortness of breath, history pulmonary embolism, DVT, smoking  EXAM: CHEST  2 VIEW  COMPARISON:  01/15/2014  FINDINGS: Normal heart size, mediastinal contours, and pulmonary vascularity.  Lungs hyperinflated but clear.  No pleural effusion or pneumothorax.  Bones unremarkable.  IMPRESSION: Hyperinflated lungs without infiltrate.   Electronically Signed   By: Ulyses SouthwardMark  Boles M.D.   On: 02/21/2014 02:49     EKG Interpretation   Date/Time:  Thursday February 21 2014 02:22:31 EST Ventricular Rate:  68 PR Interval:  138 QRS Duration: 82 QT Interval:  392 QTC Calculation: 416 R Axis:   64 Text Interpretation:  Normal sinus rhythm Nonspecific T wave abnormality  Abnormal ECG No significant change since last tracing Confirmed by Erroll Lunani,  Ryheem Jay Ayokunle [phone]([id number]) on 02/21/2014 3:39:46 AM      MDM   Final diagnoses:  Chest pain  SOB (shortness of breath)    Patient since emergency department for chest pain. Patient has  known protein C deficiency and prior CT that shows multiple pulmonary embolism. I do not believe repeat CT scan is indicated at this time. He is taking his Xarelto which improves his pain. He was told there was a mixup with his insurance and the pharmacy which is why he cannot get it filled. He is educated on the importance of taking his Xarelto.  Regarding the patient's headache and lightheadedness, does not appear to be severe. He has a normal neurological exam here, and does not have risk factors for stroke or subarachnoid hemorrhage. Patient was given Norco for chest pain relief, he will follow-up with his primary care physician, his vital signs remain within his normal limits and he is safe for discharge.  I personally performed the services described in this documentation, which was scribed in my presence. The recorded information has been reviewed and is accurate.    Tomasita CrumbleAdeleke Violetta Lavalle, MD 02/21/14 [id number]  Tomasita CrumbleAdeleke Taron Conrey,  MD 02/21/14 [id number]  Tomasita CrumbleAdeleke Alaura Schippers, MD 02/21/14 [id number]

## 2014-02-21 NOTE — ED Notes (Signed)
Pt. reports central chest pain for 2 days with SOB , productive cough , headache and dizziness.

## 2014-02-27 ENCOUNTER — Ambulatory Visit: Payer: Self-pay

## 2014-02-27 ENCOUNTER — Encounter: Payer: Self-pay | Admitting: Cardiology

## 2014-02-27 ENCOUNTER — Ambulatory Visit: Payer: Self-pay | Attending: Cardiology | Admitting: Cardiology

## 2014-02-27 VITALS — BP 120/73 | HR 56 | Temp 98.3°F | Resp 20 | Ht 66.0 in | Wt 151.0 lb

## 2014-02-27 DIAGNOSIS — Z720 Tobacco use: Secondary | ICD-10-CM

## 2014-02-27 DIAGNOSIS — R911 Solitary pulmonary nodule: Secondary | ICD-10-CM | POA: Insufficient documentation

## 2014-02-27 DIAGNOSIS — J449 Chronic obstructive pulmonary disease, unspecified: Secondary | ICD-10-CM | POA: Insufficient documentation

## 2014-02-27 DIAGNOSIS — R079 Chest pain, unspecified: Secondary | ICD-10-CM | POA: Insufficient documentation

## 2014-02-27 DIAGNOSIS — D6859 Other primary thrombophilia: Secondary | ICD-10-CM

## 2014-02-27 DIAGNOSIS — F17210 Nicotine dependence, cigarettes, uncomplicated: Secondary | ICD-10-CM | POA: Insufficient documentation

## 2014-02-27 DIAGNOSIS — R0600 Dyspnea, unspecified: Secondary | ICD-10-CM

## 2014-02-27 DIAGNOSIS — I2699 Other pulmonary embolism without acute cor pulmonale: Secondary | ICD-10-CM | POA: Insufficient documentation

## 2014-02-27 DIAGNOSIS — R072 Precordial pain: Secondary | ICD-10-CM

## 2014-02-27 DIAGNOSIS — I82409 Acute embolism and thrombosis of unspecified deep veins of unspecified lower extremity: Secondary | ICD-10-CM | POA: Insufficient documentation

## 2014-02-27 DIAGNOSIS — R0609 Other forms of dyspnea: Secondary | ICD-10-CM | POA: Insufficient documentation

## 2014-02-27 DIAGNOSIS — Z7901 Long term (current) use of anticoagulants: Secondary | ICD-10-CM | POA: Insufficient documentation

## 2014-02-27 MED ORDER — RIVAROXABAN 20 MG PO TABS: 20.0000 mg | ORAL_TABLET | Freq: Every day | ORAL | Status: DC

## 2014-02-27 NOTE — Assessment & Plan Note (Signed)
This is chronic and appears to be stress related. He does have other causes for his dyspnea and chest discomfort including pulmonary emboli. This is not cardiac.

## 2014-02-27 NOTE — Progress Notes (Signed)
HPI [name] comes in today for evaluation of chronic chest pain. This describes a tightness across the center of his chest. It comes on spontaneously but uses associated with some sort of emotional reaction including excitement or anger.  He does have a history of COPD and continues to smoke 5 cigarettes a day and has since he was a teenager. CT scans show a stable left lower lobe nodule that needs follow-up again in October 2016. He also has a history of at least 2 episodes [place] most recently multiple pulmonary emboli in November. He is on Xarelto. He denies any hemoptysis but does have dyspnea on exertion. His CT scan did show some right heart strain and enlargement probably from his COPD and pulmonary emboli. If he overexerts himself he sometimes gets dizzy but has not fainted.  Past Medical History  Diagnosis Date  . PE (pulmonary embolism)   . DVT of lower extremity (deep venous thrombosis)     Current Outpatient Prescriptions  Medication Sig Dispense Refill  . rivaroxaban (XARELTO) 20 MG TABS tablet Take 1 tablet (20 mg total) by mouth daily with supper. 30 tablet 0  . HYDROcodone-acetaminophen (NORCO/VICODIN) 5-325 MG per tablet Take 1 tablet by mouth 2 (two) times daily as needed for severe pain. (Patient not taking: Reported on 02/27/2014) 12 tablet 0  . Rivaroxaban (XARELTO) 15 MG TABS tablet Take 1 tablet (15 mg total) by mouth 2 (two) times daily with a meal. (Patient not taking: Reported on 02/21/2014) 39 tablet 0   No current facility-administered medications for this visit.    No Known Allergies  History reviewed. No pertinent family history.  History   Social History  . Marital Status: Single    Spouse Name: N/A    Number of Children: N/A  . Years of Education: N/A   Occupational History  . Not on file.   Social History Main Topics  . Smoking status: Current Every Day Smoker -- 0.50 packs/day    Types:  Cigarettes  . Smokeless tobacco: Not on file  . Alcohol Use: Yes  . Drug Use: No  . Sexual Activity: Not on file   Other Topics Concern  . Not on file   Social History Narrative    ROS ALL NEGATIVE EXCEPT THOSE NOTED IN HPI  PE  General Appearance: well developed, well nourished in no acute distress, looks younger than stated age HEENT: symmetrical face, PERRLA,  Neck: no JVD, thyromegaly, or adenopathy, trachea midline Chest: symmetric without deformity Cardiac: PMI non-displaced, RRR, normal S1, S2, no gallop or murmur Lung: Good breath sounds throughout with some rhonchi particularly in the left lower lobe. Vascular: all pulses full without bruits  Abdominal: nondistended, nontender, good bowel sounds, no HSM, no bruits Extremities: no cyanosis, clubbing, 2+ ankle edema bilaterally, no sign of DVT, no varicosities  Skin: normal color, no rashes Neuro: alert and oriented x 3, non-focal Pysch: normal affect  EKG  BMET    Component Value Date/Time   NA 144 02/21/2014 0237   K 3.5* 02/21/2014 0237   CL 104 02/21/2014 0237   CO2 27 02/21/2014 0237   GLUCOSE 97 02/21/2014 0237   BUN 11 02/21/2014 0237   CREATININE 0.95 02/21/2014 0237   CALCIUM 9.2 02/21/2014 0237   GFRNONAA >90 02/21/2014 0237   GFRAA >90 02/21/2014 0237    Lipid Panel  No results found for: CHOL, TRIG, HDL, CHOLHDL, VLDL, LDLCALC  CBC  Component Value Date/Time   WBC 7.4 02/21/2014 0237   RBC 4.56 02/21/2014 0237   HGB 12.0* 02/21/2014 0237   HCT 36.3* 02/21/2014 0237   PLT 193 02/21/2014 0237   MCV 79.6 02/21/2014 0237   MCH 26.3 02/21/2014 0237   MCHC 33.1 02/21/2014 0237   RDW 16.0* 02/21/2014 0237   LYMPHSABS 1.4 02/16/2014 1227   MONOABS 0.5 02/16/2014 1227   EOSABS 0.0 02/16/2014 1227   BASOSABS 0.0 02/16/2014 1227

## 2014-02-27 NOTE — Assessment & Plan Note (Signed)
With recurrent pulmonary embolus, on one occasion multiple pulmonary emboli, history of smoking and continues smoking, and probable protein C deficiency he needs to stay on anticoagulation long-term. Discussed with him quite openly the importance of stopping smoking. He already has significant dyspnea on exertion and also dizziness and signs of pulmonary hypertension with right heart strain.

## 2014-02-27 NOTE — Patient Instructions (Signed)
It was great meeting you today. Continue taking Xarelto 20 mg daily. It is very important to take this medication as prescribed. Please continue cutting back on smoking.  Below is information about smoking cessation. Thanks so much.   Smoking Cessation Quitting smoking is important to your health and has many advantages. However, it is not always easy to quit since nicotine is a very addictive drug. Oftentimes, people try 3 times or more before being able to quit. This document explains the best ways for you to prepare to quit smoking. Quitting takes hard work and a lot of effort, but you can do it. ADVANTAGES OF QUITTING SMOKING  You will live longer, feel better, and live better.  Your body will feel the impact of quitting smoking almost immediately.  Within 20 minutes, blood pressure decreases. Your pulse returns to its normal level.  After 8 hours, carbon monoxide levels in the blood return to normal. Your oxygen level increases.  After 24 hours, the chance of having a heart attack starts to decrease. Your breath, hair, and body stop smelling like smoke.  After 48 hours, damaged nerve endings begin to recover. Your sense of taste and smell improve.  After 72 hours, the body is virtually free of nicotine. Your bronchial tubes relax and breathing becomes easier.  After 2 to 12 weeks, lungs can hold more air. Exercise becomes easier and circulation improves.  The risk of having a heart attack, stroke, cancer, or lung disease is greatly reduced.  After 1 year, the risk of coronary heart disease is cut in half.  After 5 years, the risk of stroke falls to the same as a nonsmoker.  After 10 years, the risk of lung cancer is cut in half and the risk of other cancers decreases significantly.  After 15 years, the risk of coronary heart disease drops, usually to the level of a nonsmoker.  If you are pregnant, quitting smoking will improve your chances of having a healthy baby.  The  people you live with, especially any children, will be healthier.  You will have extra money to spend on things other than cigarettes. QUESTIONS TO THINK ABOUT BEFORE ATTEMPTING TO QUIT You may want to talk about your answers with your health care provider.  Why do you want to quit?  If you tried to quit in the past, what helped and what did not?  What will be the most difficult situations for you after you quit? How will you plan to handle them?  Who can help you through the tough times? Your family? Friends? A health care provider?  What pleasures do you get from smoking? What ways can you still get pleasure if you quit? Here are some questions to ask your health care provider:  How can you help me to be successful at quitting?  What medicine do you think would be best for me and how should I take it?  What should I do if I need more help?  What is smoking withdrawal like? How can I get information on withdrawal? GET READY  Set a quit date.  Change your environment by getting rid of all cigarettes, ashtrays, matches, and lighters in your home, car, or work. Do not let people smoke in your home.  Review your past attempts to quit. Think about what worked and what did not. GET SUPPORT AND ENCOURAGEMENT You have a better chance of being successful if you have help. You can get support in many ways.  Tell your  family, friends, and coworkers that you are going to quit and need their support. Ask them not to smoke around you.  Get individual, group, or telephone counseling and support. Programs are available at [facility] and [facility]. Call your local health department for information about programs in your area.  Spiritual beliefs and practices may help some smokers quit.  Download a "quit meter" on your computer to keep track of quit statistics, such as how long you have gone without smoking, cigarettes not smoked, and money saved.  Get a self-help book about  quitting smoking and staying off tobacco. LEARN NEW SKILLS AND BEHAVIORS  Distract yourself from urges to smoke. Talk to someone, go for a walk, or occupy your time with a task.  Change your normal routine. Take a different route to work. Drink tea instead of coffee. Eat breakfast in a different place.  Reduce your stress. Take a hot bath, exercise, or read a book.  Plan something enjoyable to do every day. Reward yourself for not smoking.  Explore interactive web-based programs that specialize in helping you quit. GET MEDICINE AND USE IT CORRECTLY Medicines can help you stop smoking and decrease the urge to smoke. Combining medicine with the above behavioral methods and support can greatly increase your chances of successfully quitting smoking.  Nicotine replacement therapy helps deliver nicotine to your body without the negative effects and risks of smoking. Nicotine replacement therapy includes nicotine gum, lozenges, inhalers, nasal sprays, and skin patches. Some may be available over-the-counter and others require a prescription.  Antidepressant medicine helps people abstain from smoking, but how this works is unknown. This medicine is available by prescription.  Nicotinic receptor partial agonist medicine simulates the effect of nicotine in your brain. This medicine is available by prescription. Ask your health care provider for advice about which medicines to use and how to use them based on your health history. Your health care provider will tell you what side effects to look out for if you choose to be on a medicine or therapy. Carefully read the information on the package. Do not use any other product containing nicotine while using a nicotine replacement product.  RELAPSE OR DIFFICULT SITUATIONS Most relapses occur within the first 3 months after quitting. Do not be discouraged if you start smoking again. Remember, most people try several times before finally quitting. You may have  symptoms of withdrawal because your body is used to nicotine. You may crave cigarettes, be irritable, feel very hungry, cough often, get headaches, or have difficulty concentrating. The withdrawal symptoms are only temporary. They are strongest when you first quit, but they will go away within 10-14 days. To reduce the chances of relapse, try to:  Avoid drinking alcohol. Drinking lowers your chances of successfully quitting.  Reduce the amount of caffeine you consume. Once you quit smoking, the amount of caffeine in your body increases and can give you symptoms, such as a rapid heartbeat, sweating, and anxiety.  Avoid smokers because they can make you want to smoke.  Do not let weight gain distract you. Many smokers will gain weight when they quit, usually less than 10 pounds. Eat a healthy diet and stay active. You can always lose the weight gained after you quit.  Find ways to improve your mood other than smoking. FOR MORE INFORMATION  www.smokefree.gov  Document Released: 02/23/2001 Document Revised: 07/16/2013 Document Reviewed: 06/10/2011 [facility] Patient Information 2015 [employer] [place]. This information is not intended to replace advice given to you  by your health care provider. Make sure you discuss any questions you have with your health care provider.  

## 2014-02-27 NOTE — Progress Notes (Signed)
Patient with history of DVT in 2005 and 10/15 (in left calf and behind left knee), PE in 2007 and 11/15. Patient taking Xarelto 20 mg daily. Complains of daily, intermittent chest tightness and shortness of breath. Patient has occasional dizziness and headaches. Patient is a smoker-working on cutting back. Now smoking <0.5 ppd. Declines flu shot.

## 2014-02-27 NOTE — Assessment & Plan Note (Signed)
He was strongly encouraged to stop smoking.

## 2014-03-04 ENCOUNTER — Emergency Department (HOSPITAL_COMMUNITY)
Admission: EM | Admit: 2014-03-04 | Discharge: 2014-03-04 | Disposition: A | Discharge status: Home / Self Care | Payer: Self-pay | Attending: Emergency Medicine | Admitting: Emergency Medicine

## 2014-03-04 ENCOUNTER — Encounter (HOSPITAL_COMMUNITY): Payer: Self-pay | Admitting: Family Medicine

## 2014-03-04 ENCOUNTER — Emergency Department (HOSPITAL_COMMUNITY): Payer: Self-pay

## 2014-03-04 DIAGNOSIS — Z9114 Patient's other noncompliance with medication regimen: Secondary | ICD-10-CM

## 2014-03-04 DIAGNOSIS — Z86711 Personal history of pulmonary embolism: Secondary | ICD-10-CM | POA: Insufficient documentation

## 2014-03-04 DIAGNOSIS — H538 Other visual disturbances: Secondary | ICD-10-CM | POA: Insufficient documentation

## 2014-03-04 DIAGNOSIS — Z720 Tobacco use: Secondary | ICD-10-CM | POA: Insufficient documentation

## 2014-03-04 DIAGNOSIS — R0602 Shortness of breath: Secondary | ICD-10-CM | POA: Insufficient documentation

## 2014-03-04 DIAGNOSIS — Z86718 Personal history of other venous thrombosis and embolism: Secondary | ICD-10-CM | POA: Insufficient documentation

## 2014-03-04 DIAGNOSIS — R079 Chest pain, unspecified: Secondary | ICD-10-CM

## 2014-03-04 DIAGNOSIS — Z91148 Patient's other noncompliance with medication regimen for other reason: Secondary | ICD-10-CM

## 2014-03-04 DIAGNOSIS — R51 Headache: Secondary | ICD-10-CM | POA: Insufficient documentation

## 2014-03-04 DIAGNOSIS — R42 Dizziness and giddiness: Secondary | ICD-10-CM | POA: Insufficient documentation

## 2014-03-04 LAB — COMPREHENSIVE METABOLIC PANEL
ALBUMIN: 4.2 g/dL (ref 3.5–5.2)
ALT: 18 U/L (ref 0–53)
ANION GAP: 13 (ref 5–15)
AST: 22 U/L (ref 0–37)
Alkaline Phosphatase: 75 U/L (ref 39–117)
BUN: 11 mg/dL (ref 6–23)
CALCIUM: 9.6 mg/dL (ref 8.4–10.5)
CO2: 28 mEq/L (ref 19–32)
CREATININE: 1.1 mg/dL (ref 0.50–1.35)
Chloride: 104 mEq/L (ref 96–112)
GFR calc Af Amer: 89 mL/min — ABNORMAL LOW (ref >90)
GFR calc non Af Amer: 77 mL/min — ABNORMAL LOW (ref >90)
Glucose, Bld: 106 mg/dL — ABNORMAL HIGH (ref 70–99)
POTASSIUM: 3.8 meq/L (ref 3.7–5.3)
Sodium: 145 mEq/L (ref 137–147)
Total Bilirubin: 0.4 mg/dL (ref 0.3–1.2)
Total Protein: 7.7 g/dL (ref 6.0–8.3)

## 2014-03-04 LAB — I-STAT CHEM 8, ED
BUN: 10 mg/dL (ref 6–23)
Calcium, Ion: 1.24 mmol/L — ABNORMAL HIGH (ref 1.12–1.23)
Chloride: 103 mEq/L (ref 96–112)
Creatinine, Ser: 1.1 mg/dL (ref 0.50–1.35)
Glucose, Bld: 112 mg/dL — ABNORMAL HIGH (ref 70–99)
HEMATOCRIT: 50 % (ref 39.0–52.0)
HEMOGLOBIN: 17 g/dL (ref 13.0–17.0)
POTASSIUM: 3.4 meq/L — AB (ref 3.7–5.3)
Sodium: 142 mEq/L (ref 137–147)
TCO2: 25 mmol/L (ref 0–100)

## 2014-03-04 LAB — I-STAT TROPONIN, ED: TROPONIN I, POC: 0 ng/mL (ref 0.00–0.08)

## 2014-03-04 LAB — DIFFERENTIAL
Basophils Absolute: 0 10*3/uL (ref 0.0–0.1)
Basophils Relative: 0 % (ref 0–1)
EOS PCT: 2 % (ref 0–5)
Eosinophils Absolute: 0.1 10*3/uL (ref 0.0–0.7)
LYMPHS ABS: 1.4 10*3/uL (ref 0.7–4.0)
Lymphocytes Relative: 27 % (ref 12–46)
Monocytes Absolute: 0.5 10*3/uL (ref 0.1–1.0)
Monocytes Relative: 10 % (ref 3–12)
Neutro Abs: 3.1 10*3/uL (ref 1.7–7.7)
Neutrophils Relative %: 61 % (ref 43–77)

## 2014-03-04 LAB — CBC
HCT: 42.6 % (ref 39.0–52.0)
Hemoglobin: 14.1 g/dL (ref 13.0–17.0)
MCH: 26.5 pg (ref 26.0–34.0)
MCHC: 33.1 g/dL (ref 30.0–36.0)
MCV: 80.1 fL (ref 78.0–100.0)
PLATELETS: 193 10*3/uL (ref 150–400)
RBC: 5.32 MIL/uL (ref 4.22–5.81)
RDW: 15.9 % — ABNORMAL HIGH (ref 11.5–15.5)
WBC: 5.1 10*3/uL (ref 4.0–10.5)

## 2014-03-04 LAB — PROTIME-INR
INR: 1.04 (ref 0.00–1.49)
PROTHROMBIN TIME: 13.7 s (ref 11.6–15.2)

## 2014-03-04 LAB — APTT: aPTT: 23 seconds — ABNORMAL LOW (ref 24–37)

## 2014-03-04 LAB — ETHANOL

## 2014-03-04 MED ORDER — OXYCODONE-ACETAMINOPHEN 5-325 MG PO TABS
1.0000 | ORAL_TABLET | Freq: Once | ORAL | Status: AC
  Administered 2014-03-04: 1 via ORAL
  Filled 2014-03-04: qty 1

## 2014-03-04 MED ORDER — RIVAROXABAN 20 MG PO TABS
20.0000 mg | ORAL_TABLET | Freq: Every day | ORAL | Status: DC
  Administered 2014-03-04: 20 mg via ORAL
  Filled 2014-03-04: qty 1

## 2014-03-04 MED ORDER — RIVAROXABAN 20 MG PO TABS: 20.0000 mg | ORAL_TABLET | Freq: Every day | ORAL | Status: DC

## 2014-03-04 NOTE — ED Notes (Signed)
Per pt sts ran out of his xarelto on Saturday and since Sunday has been having chest pain. Hx of PE. sts some SOB.

## 2014-03-04 NOTE — ED Notes (Addendum)
Pt sts "I am trying to get it for you", for a urine sample.  Requested an additional sandwich and drink.  Provided beverage and requested urine sample before he consumes any more.  EDP made aware.

## 2014-03-04 NOTE — ED Notes (Signed)
Pt returned from CT °

## 2014-03-04 NOTE — Discharge Planning (Signed)
CARE MANAGEMENT ED NOTE 03/04/2014  Patient:  Peter Washington, Peter Washington   Account Number:  [id number]  Date Initiated:  03/04/2014  Documentation initiated by:  [facility]  Subjective/Objective Assessment:   Per pt sts ran out of his xarelto on Saturday and since Sunday has been having chest pain. Hx of PE. sts some SOB.//Homeless          Subjective/Objective Assessment Detail:     Action/Plan:   lab work, pain medication and CXR.//CM consult for medication assistance.    Anticipated DC Date:  03/04/2014         DC Planning Services  CM consult  [place] Program  PCP issues             Status of service:  Completed, signed off  ED Comments:  Pt is uninsured and has no PCP.  Plan dc on Xarelto.  Will follow up for medication assistance.    ED Comments Detail:  Met with pt to discuss dc needs.  Pt recently admitted with DVT and dc'd on Xarelto.  Pt states he was given 30 day free trial card for med, but [facility] would not accept it. Asked if pt had a valid Rx with the card, and pt stated "no, I just gave them the card.   I didn't know I needed a Rx."  Pt is eligible for medication assistance through [facility] program.  Pt given [facility] letter with explanation of program benefits.  Pt also given free trial card for Xarelto.  This will give pt a total of 60 days of med at little or no cost. He will fill prescriptions at [facility] where he will meet with the pharmacist at this time to enroll in patient assistance program for Xarelto.  Pt is agreeable to this plan, and states he will follow up.  He seems appreciative of my help.

## 2014-03-04 NOTE — ED Notes (Signed)
Pt told this RN that he has not taken "blood thinner" in two days and wondered when and if he was going to be getting any. Dr. Rubin PayorPickering notified.

## 2014-03-04 NOTE — ED Notes (Signed)
Dr. Pickering at the bedside.  

## 2014-03-04 NOTE — Discharge Instructions (Signed)

## 2014-03-04 NOTE — ED Notes (Signed)
Pt transported to CT ?

## 2014-03-04 NOTE — ED Provider Notes (Signed)
CSN: [id number]     Arrival date & time 03/04/14  0806 History   First MD Initiated Contact with Patient 03/04/14 [phone]     Chief Complaint  Patient presents with  . Chest Pain  . Shortness of Breath     (Consider location/radiation/quality/duration/timing/severity/associated sxs/prior Treatment) Patient is a 48 y.o. male presenting with chest pain and shortness of breath. The history is provided by the patient.  Chest Pain Associated symptoms: dizziness, headache and shortness of breath   Associated symptoms: no abdominal pain, no back pain, no nausea, no numbness, not vomiting and no weakness   Shortness of Breath Associated symptoms: chest pain and headaches   Associated symptoms: no abdominal pain, no rash and no vomiting    patient presents with chest pain shortness of breath and headache with dizziness. He has a history of protein C deficiency and is on the relative. He's been having issues getting his medication and states he ran out a couple days ago. States he then developed shortness of breath and headache. He states chest pain shortness of breath tend to come on when he goes off Xarelto. He states he used to be longer before it would come on but now it is shorter after his run out of his pills. He states he also this time has a headache with dizziness. States it feels like the room was spinning. No confusion. The headache is frontal. States he has some blurred vision diffusely. He states this is different than the headaches he normally gets. He has been seen by cardiology and per the notes this chronic chest pain is noncardiac.  Past Medical History  Diagnosis Date  . PE (pulmonary embolism)   . DVT of lower extremity (deep venous thrombosis)    History reviewed. No pertinent past surgical history. History reviewed. No pertinent family history. History  Substance Use Topics  . Smoking status: Current Every Day Smoker -- 0.50 packs/day    Types: Cigarettes  . Smokeless  tobacco: Not on file  . Alcohol Use: Yes    Review of Systems  Constitutional: Negative for activity change and appetite change.  Eyes: Positive for visual disturbance. Negative for pain.  Respiratory: Positive for shortness of breath. Negative for chest tightness.   Cardiovascular: Positive for chest pain. Negative for leg swelling.  Gastrointestinal: Negative for nausea, vomiting, abdominal pain and diarrhea.  Genitourinary: Negative for flank pain.  Musculoskeletal: Negative for back pain and neck stiffness.  Skin: Negative for rash.  Neurological: Positive for dizziness and headaches. Negative for weakness and numbness.  Psychiatric/Behavioral: Negative for behavioral problems.      Allergies  Pork-derived products  Home Medications   Prior to Admission medications   Medication Sig Start Date End Date Taking? Authorizing Provider  HYDROcodone-acetaminophen (NORCO/VICODIN) 5-325 MG per tablet Take 1 tablet by mouth 2 (two) times daily as needed for severe pain. Patient not taking: Reported on 02/27/2014 02/21/14   Tomasita CrumbleAdeleke Oni, MD  rivaroxaban (XARELTO) 20 MG TABS tablet Take 1 tablet (20 mg total) by mouth daily with supper. 03/04/14   Juliet RudeNathan R. Raevin Wierenga, MD   BP 118/64 mmHg  Pulse 52  Temp(Src) 97.7 F (36.5 C) (Oral)  Resp 13  Ht 5\' 6"  (1.676 m)  Wt 150 lb (68.04 kg)  BMI 24.22 kg/m2  SpO2 100% Physical Exam  Constitutional: He is oriented to person, place, and time. He appears well-developed and well-nourished.  HENT:  Head: Normocephalic and atraumatic.  Eyes: EOM are normal. Pupils are equal, round,  and reactive to light.  Neck: Normal range of motion. Neck supple.  Cardiovascular: Normal rate, regular rhythm and normal heart sounds.   No murmur heard. Pulmonary/Chest: Effort normal and breath sounds normal.  Abdominal: Soft. Bowel sounds are normal. He exhibits no distension and no mass. There is no tenderness. There is no rebound and no guarding.   Musculoskeletal: Normal range of motion. He exhibits no edema.  Neurological: He is alert and oriented to person, place, and time. No cranial nerve deficit.  Finger-nose intact bilaterally. Normal ambulation.  Skin: Skin is warm and dry.  Psychiatric: He has a normal mood and affect.  Nursing note and vitals reviewed.   ED Course  Procedures (including critical care time) Labs Review Labs Reviewed  APTT - Abnormal; Notable for the following:    aPTT 23 (*)    All other components within normal limits  CBC - Abnormal; Notable for the following:    RDW 15.9 (*)    All other components within normal limits  COMPREHENSIVE METABOLIC PANEL - Abnormal; Notable for the following:    Glucose, Bld 106 (*)    GFR calc non Af Amer 77 (*)    GFR calc Af Amer 89 (*)    All other components within normal limits  I-STAT CHEM 8, ED - Abnormal; Notable for the following:    Potassium 3.4 (*)    Glucose, Bld 112 (*)    Calcium, Ion 1.24 (*)    All other components within normal limits  ETHANOL  PROTIME-INR  DIFFERENTIAL  I-STAT TROPOININ, ED    Imaging Review Dg Chest 2 View  03/04/2014   CLINICAL DATA:  Chest pain previous pulmonary embolism  EXAM: CHEST  2 VIEW  COMPARISON:  PA and lateral chest of February 21, 2014  FINDINGS: The lungs are adequately inflated. There is no focal infiltrate. There is no pleural effusion or pneumothorax. The heart and mediastinal structures are normal. The bony thorax exhibits mild disc space narrowing at multiple thoracic levels.  IMPRESSION: There is no active cardiopulmonary disease.   Electronically Signed   By: David  SwazilandJordan   On: 03/04/2014 09:34   Ct Head Wo Contrast  03/04/2014   CLINICAL DATA:  48YOM with headache and dizziness  EXAM: CT HEAD WITHOUT CONTRAST  TECHNIQUE: Contiguous axial images were obtained from the base of the skull through the vertex without intravenous contrast.  COMPARISON:  COMPARISON : None.  FINDINGS: No acute intracranial  hemorrhage. No focal mass lesion. No CT evidence of acute infarction. No midline shift or mass effect. No hydrocephalus. Basilar cisterns are patent. Paranasal sinuses and mastoid air cells are clear.  IMPRESSION: Normal head CT.   Electronically Signed   By: Genevive BiStewart  Edmunds M.D.   On: 03/04/2014 09:02     EKG Interpretation   Date/Time:  Monday March 04 2014 08:12:19 EST Ventricular Rate:  55 PR Interval:  118 QRS Duration: 88 QT Interval:  422 QTC Calculation: 403 R Axis:   67 Text Interpretation:  Sinus bradycardia T wave abnormality, consider  lateral ischemia Abnormal ECG No significant change since last tracing  Confirmed by Rubin PayorPICKERING  MD, Harrold DonathNATHAN [id number]) on 03/04/2014 8:18:54 AM      MDM   Final diagnoses:  Chest pain  Noncompliance with medications    Patient with chest pain. Noncompliance with his anticoagulant. Has had the same in the past. Also had headache with negative CT. Does not appear to need further imaging at this time. His been seen  by social work and case management. They've arranged follow-up with [facility] [place] [facility] to hopefully fill his prescription. He has had other appointments it is not gone to. Will discharge home. Has had previous CT scans and has had pulmonary embolism. Does not appear to have a hemodynamically significant PE at this time if he does have one    Harrold Donath R. Rubin Payor, MD 03/05/14 [phone]

## 2014-03-05 ENCOUNTER — Other Ambulatory Visit: Payer: Self-pay | Admitting: Internal Medicine

## 2014-03-05 MED ORDER — RIVAROXABAN 20 MG PO TABS: 20.0000 mg | ORAL_TABLET | Freq: Every day | ORAL | Status: DC

## 2014-05-11 ENCOUNTER — Encounter (HOSPITAL_COMMUNITY): Payer: Self-pay | Admitting: Emergency Medicine

## 2014-05-11 ENCOUNTER — Emergency Department (HOSPITAL_COMMUNITY)
Admission: EM | Admit: 2014-05-11 | Discharge: 2014-05-11 | Disposition: A | Discharge status: Home / Self Care | Payer: Self-pay | Attending: Emergency Medicine | Admitting: Emergency Medicine

## 2014-05-11 ENCOUNTER — Emergency Department (HOSPITAL_COMMUNITY): Payer: Self-pay

## 2014-05-11 DIAGNOSIS — R200 Anesthesia of skin: Secondary | ICD-10-CM | POA: Insufficient documentation

## 2014-05-11 DIAGNOSIS — R229 Localized swelling, mass and lump, unspecified: Secondary | ICD-10-CM | POA: Insufficient documentation

## 2014-05-11 DIAGNOSIS — Z720 Tobacco use: Secondary | ICD-10-CM | POA: Insufficient documentation

## 2014-05-11 DIAGNOSIS — Z86711 Personal history of pulmonary embolism: Secondary | ICD-10-CM | POA: Insufficient documentation

## 2014-05-11 DIAGNOSIS — Z7901 Long term (current) use of anticoagulants: Secondary | ICD-10-CM | POA: Insufficient documentation

## 2014-05-11 DIAGNOSIS — Z86718 Personal history of other venous thrombosis and embolism: Secondary | ICD-10-CM | POA: Insufficient documentation

## 2014-05-11 LAB — I-STAT TROPONIN, ED: Troponin i, poc: 0 ng/mL (ref 0.00–0.08)

## 2014-05-11 LAB — CBC
HEMATOCRIT: 37.2 % — AB (ref 39.0–52.0)
Hemoglobin: 12.3 g/dL — ABNORMAL LOW (ref 13.0–17.0)
MCH: 26.3 pg (ref 26.0–34.0)
MCHC: 33.1 g/dL (ref 30.0–36.0)
MCV: 79.5 fL (ref 78.0–100.0)
Platelets: 207 10*3/uL (ref 150–400)
RBC: 4.68 MIL/uL (ref 4.22–5.81)
RDW: 14.5 % (ref 11.5–15.5)
WBC: 5.3 10*3/uL (ref 4.0–10.5)

## 2014-05-11 LAB — BASIC METABOLIC PANEL
Anion gap: 6 (ref 5–15)
BUN: 19 mg/dL (ref 6–23)
CHLORIDE: 108 mmol/L (ref 96–112)
CO2: 24 mmol/L (ref 19–32)
CREATININE: 1.15 mg/dL (ref 0.50–1.35)
Calcium: 8.9 mg/dL (ref 8.4–10.5)
GFR calc Af Amer: 84 mL/min — ABNORMAL LOW (ref >90)
GFR, EST NON AFRICAN AMERICAN: 73 mL/min — AB (ref >90)
GLUCOSE: 91 mg/dL (ref 70–99)
POTASSIUM: 4 mmol/L (ref 3.5–5.1)
Sodium: 138 mmol/L (ref 135–145)

## 2014-05-11 LAB — PROTIME-INR
INR: 1.39 (ref 0.00–1.49)
PROTHROMBIN TIME: 17.2 s — AB (ref 11.6–15.2)

## 2014-05-11 NOTE — ED Provider Notes (Signed)
CSN: [id number]     Arrival date & time 05/11/14  0114 History   First MD Initiated Contact with Patient 05/11/14 [phone]     Chief Complaint  Patient presents with  . Numbness     (Consider location/radiation/quality/duration/timing/severity/associated sxs/prior Treatment) HPI Peter Washington is a 49 y.o. male with past medical history of DVT, PE on Xarelto presenting today with left upper extremity numbness. Patient states this is going on for the past week intermittently. Nothing makes it better or worse. This has happened to him in the past but usually resolves within one day. He is presenting because it has lasted for a week. He denies any pain anywhere, he has no shortness of breath. He states he's been compliant with his Xarelto medication. Patient denies any other neurological symptoms. He has no further complaints.  10 Systems reviewed and are negative for acute change except as noted in the HPI.     Past Medical History  Diagnosis Date  . PE (pulmonary embolism)   . DVT of lower extremity (deep venous thrombosis)    History reviewed. No pertinent past surgical history. No family history on file. History  Substance Use Topics  . Smoking status: Current Every Day Smoker -- 0.50 packs/day    Types: Cigarettes  . Smokeless tobacco: Not on file  . Alcohol Use: Yes    Review of Systems    Allergies  Pork-derived products  Home Medications   Prior to Admission medications   Medication Sig Start Date End Date Taking? Authorizing Provider  rivaroxaban (XARELTO) 20 MG TABS tablet Take 1 tablet (20 mg total) by mouth daily with supper. 03/05/14  Yes Quentin Angstlugbemiga E Jegede, MD  HYDROcodone-acetaminophen (NORCO/VICODIN) 5-325 MG per tablet Take 1 tablet by mouth 2 (two) times daily as needed for severe pain. Patient not taking: Reported on 02/27/2014 02/21/14   Tomasita CrumbleAdeleke Jachin Coury, MD   BP 103/53 mmHg  Pulse 46  Temp(Src) 97.5 F (36.4 C) (Oral)  Resp 20  SpO2 100% Physical Exam   Constitutional: He is oriented to person, place, and time. Vital signs are normal. He appears well-developed and well-nourished.  Non-toxic appearance. He does not appear ill. No distress.  HENT:  Head: Normocephalic and atraumatic.  Nose: Nose normal.  Mouth/Throat: Oropharynx is clear and moist. No oropharyngeal exudate.  Eyes: Conjunctivae and EOM are normal. Pupils are equal, round, and reactive to light. No scleral icterus.  Neck: Normal range of motion. Neck supple. No tracheal deviation, no edema, no erythema and normal range of motion present. No thyroid mass and no thyromegaly present.  Cardiovascular: Normal rate, regular rhythm, S1 normal, S2 normal, normal heart sounds, intact distal pulses and normal pulses.  Exam reveals no gallop and no friction rub.   No murmur heard. Pulses:      Radial pulses are 2+ on the right side, and 2+ on the left side.       Dorsalis pedis pulses are 2+ on the right side, and 2+ on the left side.  Pulmonary/Chest: Effort normal and breath sounds normal. No respiratory distress. He has no wheezes. He has no rhonchi. He has no rales.  Abdominal: Soft. Normal appearance and bowel sounds are normal. He exhibits no distension, no ascites and no mass. There is no hepatosplenomegaly. There is no tenderness. There is no rebound, no guarding and no CVA tenderness.  Musculoskeletal: Normal range of motion. He exhibits edema. He exhibits no tenderness.  Lymphadenopathy:    He has no cervical adenopathy.  Neurological:  He is alert and oriented to person, place, and time. He has normal strength. No cranial nerve deficit or sensory deficit. He exhibits normal muscle tone.  Normal strength in all 4 extremities. Decreased sensation to the left upper extremity going to the mid humerus.  Skin: Skin is warm, dry and intact. No petechiae and no rash noted. He is not diaphoretic. No erythema. No pallor.  Psychiatric: He has a normal mood and affect. His behavior is normal.  Judgment normal.  Nursing note and vitals reviewed.   ED Course  Procedures (including critical care time) Labs Review Labs Reviewed  CBC - Abnormal; Notable for the following:    Hemoglobin 12.3 (*)    HCT 37.2 (*)    All other components within normal limits  BASIC METABOLIC PANEL - Abnormal; Notable for the following:    GFR calc non Af Amer 73 (*)    GFR calc Af Amer 84 (*)    All other components within normal limits  PROTIME-INR - Abnormal; Notable for the following:    Prothrombin Time 17.2 (*)    All other components within normal limits  I-STAT TROPOININ, ED    Imaging Review Dg Chest 2 View  05/11/2014   CLINICAL DATA:  Left arm numbness.  EXAM: CHEST  2 VIEW  COMPARISON:  03/04/2014  FINDINGS: The cardiomediastinal contours are normal. The lungs are clear. Pulmonary vasculature is normal. No consolidation, pleural effusion, or pneumothorax. No acute osseous abnormalities are seen.  IMPRESSION: No acute pulmonary process.   Electronically Signed   By: Rubye Oaks M.D.   On: 05/11/2014 06:55     EKG Interpretation   Date/Time:  Saturday May 11 2014 01:25:06 EST Ventricular Rate:  53 PR Interval:  130 QRS Duration: 86 QT Interval:  402 QTC Calculation: 377 R Axis:   15 Text Interpretation:  Sinus bradycardia Nonspecific T wave abnormality  Abnormal ECG No significant change since last tracing Confirmed by Erroll Luna [id number]) on 05/11/2014 4:25:38 AM      MDM   Final diagnoses:  Numbness   patient presents emergency department for left upper extremity numbness. He is denying any other symptoms such as chest pain, shortness of breath, headache, neurological symptoms. I believe this is likely a peripheral neuropathy. He was reassured that is unrelated to his Xarelto. coags support compliance with xarelto.  Workup in the emergency department is negative, he is in no acute distress seen resting comfortably in the room. His vital signs remain  within his normal limits, he states his heart rate is always low,  and he is safe for discharged with primary care follow-up within 3 days.    Tomasita Crumble, MD 05/11/14 1525

## 2014-05-11 NOTE — Discharge Instructions (Signed)
Peripheral Neuropathy Peter Washington, follow-up with her primary care physician within 3 days for continued management. It is not likely that your numbness is related to use your Xarelto. Continue taking this medication for pulmonary embolism. If any symptoms worsen come back to the emergency department immediately. Thank you. Peripheral neuropathy is a type of nerve damage. It affects nerves that carry signals between the spinal cord and other parts of the body. These are called peripheral nerves. With peripheral neuropathy, one nerve or a group of nerves may be damaged.  CAUSES  Many things can damage peripheral nerves. For some people with peripheral neuropathy, the cause is unknown. Some causes include:  Diabetes. This is the most common cause of peripheral neuropathy.  Injury to a nerve.  Pressure or stress on a nerve that lasts a long time.  Too little vitamin B. Alcoholism can lead to this.  Infections.  Autoimmune diseases, such as multiple sclerosis and systemic lupus erythematosus.  Inherited nerve diseases.  Some medicines, such as cancer drugs.  Toxic substances, such as lead and mercury.  Too little blood flowing to the legs.  Kidney disease.  Thyroid disease. SIGNS AND SYMPTOMS  Different people have different symptoms. The symptoms you have will depend on which of your nerves is damaged. Common symptoms include:  Loss of feeling (numbness) in the feet and hands.  Tingling in the feet and hands.  Pain that burns.  Very sensitive skin.  Weakness.  Not being able to move a part of the body (paralysis).  Muscle twitching.  Clumsiness or poor coordination.  Loss of balance.  Not being able to control your bladder.  Feeling dizzy.  Sexual problems. DIAGNOSIS  Peripheral neuropathy is a symptom, not a disease. Finding the cause of peripheral neuropathy can be hard. To figure that out, your health care provider will take a medical history and do a physical  exam. A neurological exam will also be done. This involves checking things affected by your brain, spinal cord, and nerves (nervous system). For example, your health care provider will check your reflexes, how you move, and what you can feel.  Other types of tests may also be ordered, such as:  Blood tests.  A test of the fluid in your spinal cord.  Imaging tests, such as CT scans or an MRI.  Electromyography (EMG). This test checks the nerves that control muscles.  Nerve conduction velocity tests. These tests check how fast messages pass through your nerves.  Nerve biopsy. A small piece of nerve is removed. It is then checked under a microscope. TREATMENT   Medicine is often used to treat peripheral neuropathy. Medicines may include:  Pain-relieving medicines. Prescription or over-the-counter medicine may be suggested.  Antiseizure medicine. This may be used for pain.  Antidepressants. These also may help ease pain from neuropathy.  Lidocaine. This is a numbing medicine. You might wear a patch or be given a shot.  Mexiletine. This medicine is typically used to help control irregular heart rhythms.  Surgery. Surgery may be needed to relieve pressure on a nerve or to destroy a nerve that is causing pain.  Physical therapy to help movement.  Assistive devices to help movement. HOME CARE INSTRUCTIONS   Only take over-the-counter or prescription medicines as directed by your health care provider. Follow the instructions carefully for any given medicines. Do not take any other medicines without first getting approval from your health care provider.  If you have diabetes, work closely with your health care provider  to keep your blood sugar under control.  If you have numbness in your feet:  Check every day for signs of injury or infection. Watch for redness, warmth, and swelling.  Wear padded socks and comfortable shoes. These help protect your feet.  Do not do things that put  pressure on your damaged nerve.  Do not smoke. Smoking keeps blood from getting to damaged nerves.  Avoid or limit alcohol. Too much alcohol can cause a lack of B vitamins. These vitamins are needed for healthy nerves.  Develop a good support system. Coping with peripheral neuropathy can be stressful. Talk to a mental health specialist or join a support group if you are struggling.  Follow up with your health care provider as directed. SEEK MEDICAL CARE IF:   You have new signs or symptoms of peripheral neuropathy.  You are struggling emotionally from dealing with peripheral neuropathy.  You have a fever. SEEK IMMEDIATE MEDICAL CARE IF:   You have an injury or infection that is not healing.  You feel very dizzy or begin vomiting.  You have chest pain.  You have trouble breathing. Document Released: 02/19/2002 Document Revised: 11/11/2010 Document Reviewed: 11/06/2012 [facility] Patient Information 2015 [employer] [place]. This information is not intended to replace advice given to you by your health care provider. Make sure you discuss any questions you have with your health care provider.

## 2014-05-11 NOTE — ED Notes (Addendum)
Pt reports L arm numbness.  Pt reports hx of same.  Pt reports he is on blood thinners, Xarelto, and wants to make sure it's not a side effect.

## 2014-05-11 NOTE — ED Notes (Signed)
C/o L arm numbness that moves intermittently from hand, forearm, and axilla all day today.  Pt denies pain. Reports history of same in the past.  No neuro deficits noted on triage exam.

## 2014-06-27 ENCOUNTER — Emergency Department (HOSPITAL_COMMUNITY): Payer: Worker's Compensation

## 2014-06-27 ENCOUNTER — Emergency Department (HOSPITAL_COMMUNITY)
Admission: EM | Admit: 2014-06-27 | Discharge: 2014-06-27 | Disposition: A | Discharge status: Home / Self Care | Payer: Worker's Compensation | Attending: Emergency Medicine | Admitting: Emergency Medicine

## 2014-06-27 ENCOUNTER — Ambulatory Visit (HOSPITAL_COMMUNITY): Payer: Self-pay | Attending: Emergency Medicine

## 2014-06-27 ENCOUNTER — Encounter (HOSPITAL_COMMUNITY): Payer: Self-pay

## 2014-06-27 DIAGNOSIS — Z79899 Other long term (current) drug therapy: Secondary | ICD-10-CM | POA: Insufficient documentation

## 2014-06-27 DIAGNOSIS — Z86711 Personal history of pulmonary embolism: Secondary | ICD-10-CM | POA: Insufficient documentation

## 2014-06-27 DIAGNOSIS — Z86718 Personal history of other venous thrombosis and embolism: Secondary | ICD-10-CM | POA: Insufficient documentation

## 2014-06-27 DIAGNOSIS — Y9289 Other specified places as the place of occurrence of the external cause: Secondary | ICD-10-CM | POA: Insufficient documentation

## 2014-06-27 DIAGNOSIS — W01198A Fall on same level from slipping, tripping and stumbling with subsequent striking against other object, initial encounter: Secondary | ICD-10-CM | POA: Insufficient documentation

## 2014-06-27 DIAGNOSIS — S8012XA Contusion of left lower leg, initial encounter: Secondary | ICD-10-CM | POA: Insufficient documentation

## 2014-06-27 DIAGNOSIS — X19XXXA Contact with other heat and hot substances, initial encounter: Secondary | ICD-10-CM | POA: Insufficient documentation

## 2014-06-27 DIAGNOSIS — Y990 Civilian activity done for income or pay: Secondary | ICD-10-CM | POA: Insufficient documentation

## 2014-06-27 DIAGNOSIS — Y9389 Activity, other specified: Secondary | ICD-10-CM | POA: Insufficient documentation

## 2014-06-27 DIAGNOSIS — Z720 Tobacco use: Secondary | ICD-10-CM | POA: Insufficient documentation

## 2014-06-27 DIAGNOSIS — T23222A Burn of second degree of single left finger (nail) except thumb, initial encounter: Secondary | ICD-10-CM

## 2014-06-27 MED ORDER — BACITRACIN ZINC 500 UNIT/GM EX OINT: 1.0000 "application " | TOPICAL_OINTMENT | Freq: Two times a day (BID) | CUTANEOUS | Status: DC

## 2014-06-27 MED ORDER — ACETAMINOPHEN 500 MG PO TABS: 1000.0000 mg | ORAL_TABLET | Freq: Four times a day (QID) | ORAL | Status: DC | PRN

## 2014-06-27 MED ORDER — IBUPROFEN 800 MG PO TABS
800.0000 mg | ORAL_TABLET | Freq: Once | ORAL | Status: DC
  Filled 2014-06-27: qty 1

## 2014-06-27 NOTE — ED Notes (Signed)
Dressing applied to hand

## 2014-06-27 NOTE — ED Provider Notes (Signed)
CSN: [id number]     Arrival date & time 06/27/14  [id number] History   First [id number] Initiated Contact with Patient 06/27/14 [phone]     Chief Complaint  Patient presents with  . Fall  . Hand Injury     (Consider location/radiation/quality/duration/timing/severity/associated sxs/prior Treatment) HPI The patient reports he was at work and some hot glue adhesive spilled onto his left index finger. Due to this incident he slipped and lost his balance and hit his left lower leg on some fixed object. He reports that he has pain on the entire side of his left leg from his knee to his foot. The patient's main concern regarding this is that he might have developed a blood clot because he had a prior history of a DVT. The patient has been able to ambulate on the leg and there are not any cuts or bleeding associated. Past Medical History  Diagnosis Date  . PE (pulmonary embolism)   . DVT of lower extremity (deep venous thrombosis)    History reviewed. No pertinent past surgical history. History reviewed. No pertinent family history. History  Substance Use Topics  . Smoking status: Light Tobacco Smoker -- 0.50 packs/day    Types: Cigarettes  . Smokeless tobacco: Not on file  . Alcohol Use: Yes    Review of Systems Constitutional: No fevers no chills   Allergies  Pork-derived products  Home Medications   Prior to Admission medications   Medication Sig Start Date End Date Taking? Authorizing Provider  acetaminophen (TYLENOL) 500 MG tablet Take 2 tablets (1,000 mg total) by mouth every 6 (six) hours as needed. 06/27/14   Arby BarretteMarcy Sira Adsit, MD  bacitracin ointment Apply 1 application topically 2 (two) times daily. 06/27/14   Arby BarretteMarcy Marcelline Temkin, MD  HYDROcodone-acetaminophen (NORCO/VICODIN) 5-325 MG per tablet Take 1 tablet by mouth 2 (two) times daily as needed for severe pain. Patient not taking: Reported on 02/27/2014 02/21/14   Tomasita CrumbleAdeleke Oni, MD  rivaroxaban (XARELTO) 20 MG TABS tablet Take 1 tablet (20 mg  total) by mouth daily with supper. 03/05/14   Quentin Angstlugbemiga E Jegede, MD   BP 110/63 mmHg  Pulse 51  Temp(Src) 98 F (36.7 C) (Oral)  Resp 16  SpO2 99% Physical Exam  Constitutional: He is oriented to person, place, and time. He appears well-developed and well-nourished. No distress.  As I entered room the patient is well resting quietly with no signs of distress.  HENT:  Head: Normocephalic and atraumatic.  Eyes: EOM are normal.  Pulmonary/Chest: Effort normal.  Musculoskeletal: Normal range of motion. He exhibits tenderness. He exhibits no edema.  Examination left index finger shows a formed bullae on the proximal palmar digit that is approximately half a centimeter diameter. There is a second smaller bullae on the interphalangeal segment again on the palmar surface. Both of these are closed. The remainder of the hand is in good condition. Normal range of motion. Examination of the left lower leg does not show any evident trauma. There is no deformity, no abrasions, no apparent hematomas. Palpation on all the bony structures reveals smooth and straight contours without gross evidence of pain on the part of the patient. He reports that he has pain throughout as I palpate from the knee to the foot.  Neurological: He is alert and oriented to person, place, and time. He exhibits normal muscle tone. Coordination normal.  Skin: Skin is warm and dry.  Psychiatric: He has a normal mood and affect.    ED Course  Procedures (including  critical care time) Labs Review Labs Reviewed - No data to display  Imaging Review Dg Hand Complete Left  06/27/2014   CLINICAL DATA:  Fall tonight at work with burn to the anterior right index finger.  EXAM: LEFT HAND - COMPLETE 3+ VIEW  COMPARISON:  None.  FINDINGS: Blister like skin deformities to the index finger. There is no opaque foreign body, subcutaneous gas, or fracture.  Dorsal subluxation of the ulnar head with degenerative changes at the distal radial  ulnar joint. There is subluxation at the first MCP joint which is likely chronic.  IMPRESSION: 1. Index finger blistering without opaque foreign body or acute osseous abnormality. 2. 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   Electronically Signed   By: Marnee Spring M.D.   On: 06/27/2014 06:25     EKG Interpretation None      MDM   Final diagnoses:  Burn of finger, left, second degree, initial encounter  Contusion of left lower leg, initial encounter   At this time I physical examination the patient's injuries appear uncomplicated. He has small, limited bullae on the palmar surface of the index finger. The left lower leg does not show any objective deformity or objective soft tissue injury. The patient was concerned that he would have a DVT because of this. The patient is given a order for outpatient DVT study.    Arby Barrette, MD 06/27/14 [phone]

## 2014-06-27 NOTE — ED Notes (Signed)
Per pt he was at work and got hot glue adhesive on hand and he lost his balance and fell. Pt c/o left leg pain 8/10; Pt c/o of left hand pain 8/10; denies LOC or hitting head in fall

## 2014-06-27 NOTE — Discharge Instructions (Signed)
Burn Care Your skin is a natural barrier to infection. It is the largest organ of your body. Burns damage this natural protection. To help prevent infection, it is very important to follow your caregiver's instructions in the care of your burn. Burns are classified as:  First degree. There is only redness of the skin (erythema). No scarring is expected.  Second degree. There is blistering of the skin. Scarring may occur with deeper burns.  Third degree. All layers of the skin are injured, and scarring is expected. HOME CARE INSTRUCTIONS   Wash your hands well before changing your bandage.  Change your bandage as often as directed by your caregiver.  Remove the old bandage. If the bandage sticks, you may soak it off with cool, clean water.  Cleanse the burn thoroughly but gently with mild soap and water.  Pat the area dry with a clean, dry cloth.  Apply a thin layer of antibacterial cream to the burn.  Apply a clean bandage as instructed by your caregiver.  Keep the bandage as clean and dry as possible.  Elevate the affected area for the first 24 hours, then as instructed by your caregiver.  Only take over-the-counter or prescription medicines for pain, discomfort, or fever as directed by your caregiver. SEEK IMMEDIATE MEDICAL CARE IF:   You develop excessive pain.  You develop redness, tenderness, swelling, or red streaks near the burn.  The burned area develops yellowish-white fluid (pus) or a bad smell.  You have a fever. MAKE SURE YOU:   Understand these instructions.  Will watch your condition.  Will get help right away if you are not doing well or get worse. Document Released: 03/01/2005 Document Revised: 05/24/2011 Document Reviewed: 07/22/2010 [facility] Patient Information 2015 [employer] [place]. This information is not intended to replace advice given to you by your health care provider. Make sure you discuss any questions you have with your health care  provider. Contusion A contusion is a deep bruise. Contusions are the result of an injury that caused bleeding under the skin. The contusion may turn blue, purple, or yellow. Minor injuries will give you a painless contusion, but more severe contusions may stay painful and swollen for a few weeks.  CAUSES  A contusion is usually caused by a blow, trauma, or direct force to an area of the body. SYMPTOMS   Swelling and redness of the injured area.  Bruising of the injured area.  Tenderness and soreness of the injured area.  Pain. DIAGNOSIS  The diagnosis can be made by taking a history and physical exam. An X-ray, CT scan, or MRI may be needed to determine if there were any associated injuries, such as fractures. TREATMENT  Specific treatment will depend on what area of the body was injured. In general, the best treatment for a contusion is resting, icing, elevating, and applying cold compresses to the injured area. Over-the-counter medicines may also be recommended for pain control. Ask your caregiver what the best treatment is for your contusion. HOME CARE INSTRUCTIONS   Put ice on the injured area.  Put ice in a plastic bag.  Place a towel between your skin and the bag.  Leave the ice on for 15-20 minutes, 3-4 times a day, or as directed by your health care provider.  Only take over-the-counter or prescription medicines for pain, discomfort, or fever as directed by your caregiver. Your caregiver may recommend avoiding anti-inflammatory medicines (aspirin, ibuprofen, and naproxen) for 48 hours because these medicines may increase bruising.  Rest the injured area.  If possible, elevate the injured area to reduce swelling. SEEK IMMEDIATE MEDICAL CARE IF:   You have increased bruising or swelling.  You have pain that is getting worse.  Your swelling or pain is not relieved with medicines. MAKE SURE YOU:   Understand these instructions.  Will watch your condition.  Will get  help right away if you are not doing well or get worse. Document Released: 12/09/2004 Document Revised: 03/06/2013 Document Reviewed: 01/04/2011 [facility] Patient Information 2015 [place], [place]. This information is not intended to replace advice given to you by your health care provider. Make sure you discuss any questions you have with your health care provider.

## 2014-12-24 ENCOUNTER — Emergency Department (HOSPITAL_COMMUNITY)
Admission: EM | Admit: 2014-12-24 | Discharge: 2014-12-25 | Disposition: A | Discharge status: Home / Self Care | Payer: Self-pay | Attending: Emergency Medicine | Admitting: Emergency Medicine

## 2014-12-24 ENCOUNTER — Encounter (HOSPITAL_COMMUNITY): Payer: Self-pay | Admitting: Emergency Medicine

## 2014-12-24 DIAGNOSIS — Z720 Tobacco use: Secondary | ICD-10-CM | POA: Insufficient documentation

## 2014-12-24 DIAGNOSIS — R1084 Generalized abdominal pain: Secondary | ICD-10-CM

## 2014-12-24 DIAGNOSIS — R319 Hematuria, unspecified: Secondary | ICD-10-CM | POA: Insufficient documentation

## 2014-12-24 DIAGNOSIS — Z86718 Personal history of other venous thrombosis and embolism: Secondary | ICD-10-CM | POA: Insufficient documentation

## 2014-12-24 DIAGNOSIS — R079 Chest pain, unspecified: Secondary | ICD-10-CM

## 2014-12-24 DIAGNOSIS — Z86711 Personal history of pulmonary embolism: Secondary | ICD-10-CM | POA: Insufficient documentation

## 2014-12-24 DIAGNOSIS — Z79899 Other long term (current) drug therapy: Secondary | ICD-10-CM | POA: Insufficient documentation

## 2014-12-24 LAB — CBC
HEMATOCRIT: 38.7 % — AB (ref 39.0–52.0)
Hemoglobin: 12.6 g/dL — ABNORMAL LOW (ref 13.0–17.0)
MCH: 25.6 pg — ABNORMAL LOW (ref 26.0–34.0)
MCHC: 32.6 g/dL (ref 30.0–36.0)
MCV: 78.5 fL (ref 78.0–100.0)
PLATELETS: 202 10*3/uL (ref 150–400)
RBC: 4.93 MIL/uL (ref 4.22–5.81)
RDW: 14.7 % (ref 11.5–15.5)
WBC: 5.4 10*3/uL (ref 4.0–10.5)

## 2014-12-24 LAB — URINALYSIS, ROUTINE W REFLEX MICROSCOPIC
BILIRUBIN URINE: NEGATIVE
Glucose, UA: 250 mg/dL — AB
KETONES UR: 40 mg/dL — AB
NITRITE: POSITIVE — AB
Protein, ur: >300 mg/dL — AB
Specific Gravity, Urine: 1.015 (ref 1.005–1.030)
pH: 6.5 (ref 5.0–8.0)

## 2014-12-24 LAB — BASIC METABOLIC PANEL
ANION GAP: 9 (ref 5–15)
BUN: 22 mg/dL — ABNORMAL HIGH (ref 6–20)
CALCIUM: 9.2 mg/dL (ref 8.9–10.3)
CO2: 28 mmol/L (ref 22–32)
CREATININE: 1.6 mg/dL — AB (ref 0.61–1.24)
Chloride: 101 mmol/L (ref 101–111)
GFR, EST AFRICAN AMERICAN: 56 mL/min — AB (ref >60)
GFR, EST NON AFRICAN AMERICAN: 48 mL/min — AB (ref >60)
Glucose, Bld: 118 mg/dL — ABNORMAL HIGH (ref 65–99)
Potassium: 3.9 mmol/L (ref 3.5–5.1)
SODIUM: 138 mmol/L (ref 135–145)

## 2014-12-24 LAB — URINE MICROSCOPIC-ADD ON

## 2014-12-24 MED ORDER — MORPHINE SULFATE (PF) 4 MG/ML IV SOLN
4.0000 mg | Freq: Once | INTRAVENOUS | Status: AC
  Administered 2014-12-25: 4 mg via INTRAVENOUS
  Filled 2014-12-24: qty 1

## 2014-12-24 NOTE — ED Notes (Signed)
Pt reports he has been urinating blood since 3pm today. Pt with gross blood in urine. Reports slight pain with urination. Denies injury

## 2014-12-25 ENCOUNTER — Emergency Department (HOSPITAL_COMMUNITY): Payer: Self-pay

## 2014-12-25 ENCOUNTER — Encounter (HOSPITAL_COMMUNITY): Payer: Self-pay

## 2014-12-25 LAB — I-STAT TROPONIN, ED: Troponin i, poc: 0.01 ng/mL (ref 0.00–0.08)

## 2014-12-25 MED ORDER — DEXTROSE 5 % IV SOLN
1.0000 g | Freq: Once | INTRAVENOUS | Status: AC
  Administered 2014-12-25: 1 g via INTRAVENOUS
  Filled 2014-12-25: qty 1

## 2014-12-25 MED ORDER — CEFTAZIDIME 1 G IJ SOLR
1.0000 g | Freq: Once | INTRAMUSCULAR | Status: DC
Start: 2014-12-25 — End: 2014-12-25

## 2014-12-25 MED ORDER — IOHEXOL 350 MG/ML SOLN: 100.0000 mL | Freq: Once | INTRAVENOUS | Status: DC | PRN

## 2014-12-25 MED ORDER — SODIUM CHLORIDE 0.9 % IV BOLUS (SEPSIS)
1000.0000 mL | Freq: Once | INTRAVENOUS | Status: AC
  Administered 2014-12-25: 1000 mL via INTRAVENOUS

## 2014-12-25 MED ORDER — CEPHALEXIN 500 MG PO CAPS: 500.0000 mg | ORAL_CAPSULE | Freq: Four times a day (QID) | ORAL | Status: DC

## 2014-12-25 NOTE — ED Notes (Signed)
Patient transported to CT 

## 2014-12-25 NOTE — Discharge Instructions (Signed)
We saw you in the ER for the pain in your chest and abdomen. All the results in the ER are normal, labs and imaging. We are not sure what is causing your symptoms. The workup in the ER is not complete, and is limited to screening for life threatening and emergent conditions only, so please see a primary care doctor for further evaluation.   Abdominal Pain, Adult Many things can cause abdominal pain. Usually, abdominal pain is not caused by a disease and will improve without treatment. It can often be observed and treated at home. Your health care provider will do a physical exam and possibly order blood tests and X-rays to help determine the seriousness of your pain. However, in many cases, more time must pass before a clear cause of the pain can be found. Before that point, your health care provider may not know if you need more testing or further treatment. HOME CARE INSTRUCTIONS Monitor your abdominal pain for any changes. The following actions may help to alleviate any discomfort you are experiencing:  Only take over-the-counter or prescription medicines as directed by your health care provider.  Do not take laxatives unless directed to do so by your health care provider.  Try a clear liquid diet (broth, tea, or water) as directed by your health care provider. Slowly move to a bland diet as tolerated. SEEK MEDICAL CARE IF:  You have unexplained abdominal pain.  You have abdominal pain associated with nausea or diarrhea.  You have pain when you urinate or have a bowel movement.  You experience abdominal pain that wakes you in the night.  You have abdominal pain that is worsened or improved by eating food.  You have abdominal pain that is worsened with eating fatty foods.  You have a fever. SEEK IMMEDIATE MEDICAL CARE IF:  Your pain does not go away within 2 hours.  You keep throwing up (vomiting).  Your pain is felt only in portions of the abdomen, such as the right side or  the left lower portion of the abdomen.  You pass bloody or black tarry stools. MAKE SURE YOU:  Understand these instructions.  Will watch your condition.  Will get help right away if you are not doing well or get worse.   This information is not intended to replace advice given to you by your health care provider. Make sure you discuss any questions you have with your health care provider.   Document Released: 12/09/2004 Document Revised: 11/20/2014 Document Reviewed: 11/08/2012 Elsevier Interactive Patient Education 2016 [employer] Inc.  Nonspecific Chest Pain  Chest pain can be caused by many different conditions. There is always a chance that your pain could be related to something serious, such as a heart attack or a blood clot in your lungs. Chest pain can also be caused by conditions that are not life-threatening. If you have chest pain, it is very important to follow up with your health care provider. CAUSES  Chest pain can be caused by:  Heartburn.  Pneumonia or bronchitis.  Anxiety or stress.  Inflammation around your heart (pericarditis) or lung (pleuritis or pleurisy).  A blood clot in your lung.  A collapsed lung (pneumothorax). It can develop suddenly on its own (spontaneous pneumothorax) or from trauma to the chest.  Shingles infection (varicella-zoster virus).  Heart attack.  Damage to the bones, muscles, and cartilage that make up your chest wall. This can include:  Bruised bones due to injury.  Strained muscles or cartilage due to  frequent or repeated coughing or overwork.  Fracture to one or more ribs.  Sore cartilage due to inflammation (costochondritis). RISK FACTORS  Risk factors for chest pain may include:  Activities that increase your risk for trauma or injury to your chest.  Respiratory infections or conditions that cause frequent coughing.  Medical conditions or overeating that can cause heartburn.  Heart disease or family history of  heart disease.  Conditions or health behaviors that increase your risk of developing a blood clot.  Having had chicken pox (varicella zoster). SIGNS AND SYMPTOMS Chest pain can feel like:  Burning or tingling on the surface of your chest or deep in your chest.  Crushing, pressure, aching, or squeezing pain.  Dull or sharp pain that is worse when you move, cough, or take a deep breath.  Pain that is also felt in your back, neck, shoulder, or arm, or pain that spreads to any of these areas. Your chest pain may come and go, or it may stay constant. DIAGNOSIS Lab tests or other studies may be needed to find the cause of your pain. Your health care provider may have you take a test called an ambulatory ECG (electrocardiogram). An ECG records your heartbeat patterns at the time the test is performed. You may also have other tests, such as:  Transthoracic echocardiogram (TTE). During echocardiography, sound waves are used to create a picture of all of the heart structures and to look at how blood flows through your heart.  Transesophageal echocardiogram (TEE).This is a more advanced imaging test that obtains images from inside your body. It allows your health care provider to see your heart in finer detail.  Cardiac monitoring. This allows your health care provider to monitor your heart rate and rhythm in real time.  Holter monitor. This is a portable device that records your heartbeat and can help to diagnose abnormal heartbeats. It allows your health care provider to track your heart activity for several days, if needed.  Stress tests. These can be done through exercise or by taking medicine that makes your heart beat more quickly.  Blood tests.  Imaging tests. TREATMENT  Your treatment depends on what is causing your chest pain. Treatment may include:  Medicines. These may include:  Acid blockers for heartburn.  Anti-inflammatory medicine.  Pain medicine for inflammatory  conditions.  Antibiotic medicine, if an infection is present.  Medicines to dissolve blood clots.  Medicines to treat coronary artery disease.  Supportive care for conditions that do not require medicines. This may include:  Resting.  Applying heat or cold packs to injured areas.  Limiting activities until pain decreases. HOME CARE INSTRUCTIONS  If you were prescribed an antibiotic medicine, finish it all even if you start to feel better.  Avoid any activities that bring on chest pain.  Do not use any tobacco products, including cigarettes, chewing tobacco, or electronic cigarettes. If you need help quitting, ask your health care provider.  Do not drink alcohol.  Take medicines only as directed by your health care provider.  Keep all follow-up visits as directed by your health care provider. This is important. This includes any further testing if your chest pain does not go away.  If heartburn is the cause for your chest pain, you may be told to keep your head raised (elevated) while sleeping. This reduces the chance that acid will go from your stomach into your esophagus.  Make lifestyle changes as directed by your health care provider. These may include:  Getting regular exercise. Ask your health care provider to suggest some activities that are safe for you.  Eating a heart-healthy diet. A registered dietitian can help you to learn healthy eating options.  Maintaining a healthy weight.  Managing diabetes, if necessary.  Reducing stress. SEEK MEDICAL CARE IF:  Your chest pain does not go away after treatment.  You have a rash with blisters on your chest.  You have a fever. SEEK IMMEDIATE MEDICAL CARE IF:   Your chest pain is worse.  You have an increasing cough, or you cough up blood.  You have severe abdominal pain.  You have severe weakness.  You faint.  You have chills.  You have sudden, unexplained chest discomfort.  You have sudden, unexplained  discomfort in your arms, back, neck, or jaw.  You have shortness of breath at any time.  You suddenly start to sweat, or your skin gets clammy.  You feel nauseous or you vomit.  You suddenly feel light-headed or dizzy.  Your heart begins to beat quickly, or it feels like it is skipping beats. These symptoms may represent a serious problem that is an emergency. Do not wait to see if the symptoms will go away. Get medical help right away. Call your local emergency services (911 in the [place] Do not drive yourself to the hospital.   This information is not intended to replace advice given to you by your health care provider. Make sure you discuss any questions you have with your health care provider.   Document Released: 12/09/2004 Document Revised: 03/22/2014 Document Reviewed: 10/05/2013 Elsevier Interactive Patient Education [employer]

## 2014-12-25 NOTE — ED Notes (Signed)
Informed MD of pt's vitals.  He requested that RN ambulate pt.  Pt ambulated well and requested a sandwhich which was given.

## 2014-12-25 NOTE — ED Provider Notes (Signed)
  Physical Exam  BP 99/56 mmHg  Pulse 41  Temp(Src) 98.2 F (36.8 C) (Oral)  Resp 12  Ht 5\' 7"  (1.702 m)  Wt 155 lb (70.308 kg)  BMI 24.27 kg/m2  SpO2 100%  Physical Exam  ED Course  Procedures  MDM  Dr. Lynelle DoctorKnapp evaluated the patient for chest pain and abdominal pain. I was asked to f/u the CT results, and if neg send pt hone. Pt has 0 SIRS and has stable vitals that are WNL. Results neg, and shared with patient. He has been given [facility] f/u.     Peter KaplanAnkit Shaheer Bonfield, MD 12/25/14 [phone]

## 2014-12-25 NOTE — ED Provider Notes (Signed)
CSN: [id number]     Arrival date & time 12/24/14  1928 History   First MD Initiated Contact with Patient 12/24/14 2333     Chief Complaint  Patient presents with  . Hematuria   HPI Patient presents to the emergency room with complaints of chest pain as well as hematuria. She states she has intermittent episodes of chest pain ever since he has had a pulmonary embolism in the past however this episode is more severe. Pain is in both sides of his chest. He feels that it is similar to when he was diagnosed with a pulmonary embolism. Patient also started noticing gross blood in his urine. He has some discomfort with urination. He has pain in his abdominal area that's bilateral. Eyes any fevers or chills. No vomiting or diarrhea. Past Medical History  Diagnosis Date  . PE (pulmonary embolism)   . 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 No pertinent past surgical history. No family history on file. Social History  Substance Use Topics  . Smoking status: Light Tobacco Smoker -- 0.50 packs/day    Types: Cigarettes  . Smokeless tobacco: None  . Alcohol Use: Yes    Review of Systems  All other systems reviewed and are negative.     Allergies  Pork-derived products  Home Medications   Prior to Admission medications   Medication Sig Start Date End Date Taking? Authorizing Provider  rivaroxaban (XARELTO) 20 MG TABS tablet Take 1 tablet (20 mg total) by mouth daily with supper. 03/05/14  Yes Quentin Angstlugbemiga E Jegede, MD  acetaminophen (TYLENOL) 500 MG tablet Take 2 tablets (1,000 mg total) by mouth every 6 (six) hours as needed. Patient not taking: Reported on 12/25/2014 06/27/14   Arby BarretteMarcy Pfeiffer, MD  bacitracin ointment Apply 1 application topically 2 (two) times daily. Patient not taking: Reported on 12/24/2014 06/27/14   Arby BarretteMarcy Pfeiffer, MD   BP 121/67 mmHg  Pulse 70  Temp(Src) 98.2 F (36.8 C) (Oral)  Resp 18  Ht 5\' 7"  (1.702 m)  Wt 155 lb (70.308 kg)   BMI 24.27 kg/m2  SpO2 96% Physical Exam  Constitutional: He appears well-developed and well-nourished. No distress.  HENT:  Head: Normocephalic and atraumatic.  Right Ear: External ear normal.  Left Ear: External ear normal.  Eyes: Conjunctivae are normal. Right eye exhibits no discharge. Left eye exhibits no discharge. No scleral icterus.  Neck: Neck supple. No tracheal deviation present.  Cardiovascular: Normal rate, regular rhythm and intact distal pulses.   Pulmonary/Chest: Effort normal and breath sounds normal. No stridor. No respiratory distress. He has no wheezes. He has no rales.  Abdominal: Soft. Bowel sounds are normal. He exhibits no distension. There is no tenderness. There is no rebound and no guarding.  Musculoskeletal: He exhibits no edema or tenderness.  Neurological: He is alert. He has normal strength. No cranial nerve deficit (no facial droop, extraocular movements intact, no slurred speech) or sensory deficit. He exhibits normal muscle tone. He displays no seizure activity. Coordination normal.  Skin: Skin is warm and dry. No rash noted.  Psychiatric: He has a normal mood and affect.  Nursing note and vitals reviewed.   ED Course  Procedures (including critical care time) Labs Review Labs Reviewed  CBC - Abnormal; Notable for the following:    Hemoglobin 12.6 (*)    HCT 38.7 (*)    MCH 25.6 (*)    All other components within normal limits  BASIC METABOLIC PANEL - Abnormal;  Notable for the following:    Glucose, Bld 118 (*)    BUN 22 (*)    Creatinine, Ser 1.60 (*)    GFR calc non Af Amer 48 (*)    GFR calc Af Amer 56 (*)    All other components within normal limits  URINALYSIS, ROUTINE W REFLEX MICROSCOPIC (NOT AT [facility]) - Abnormal; Notable for the following:    Color, Urine RED (*)    APPearance TURBID (*)    Glucose, UA 250 (*)    Hgb urine dipstick LARGE (*)    Ketones, ur 40 (*)    Protein, ur >300 (*)    Urobilinogen, UA >8.0 (*)    Nitrite  POSITIVE (*)    Leukocytes, UA MODERATE (*)    All other components within normal limits  URINE MICROSCOPIC-ADD ON - Abnormal; Notable for the following:    Bacteria, UA FEW (*)    All other components within normal limits  I-STAT TROPOININ, ED   I have personally reviewed and evaluated these images and lab results as part of my medical decision-making.   EKG Interpretation   Date/Time:  Tuesday December 24 2014 23:51:42 EDT Ventricular Rate:  59 PR Interval:  136 QRS Duration: 76 QT Interval:  409 QTC Calculation: 405 R Axis:   94 Text Interpretation:  Sinus rhythm Borderline right axis deviation  Borderline T wave abnormalities ED PHYSICIAN INTERPRETATION AVAILABLE IN  [facility] Confirmed by TEST, Record ([id number]) on 12/25/2014 6:46:14 AM      MDM  Pt's symptoms are atypical.  He is complaining of pain in his chest that he feels is similar to his prior PE.  Also is complaining of abdominal pain and gross hematuria.  Mild increase in renal insufficiency.   CBC is unremarkable.  Hematuria noted.  Will CT abdomen, pelvis and chest.  Case turned over to Dr Rhunette Croft.     Linwood Dibbles, MD 12/26/14 2230

## 2014-12-25 NOTE — ED Notes (Signed)
Informed MD of decreasing blood pressure and heart rate.  He ordered fluids and antibiotics.  Pt reports that he "just feels bad."

## 2014-12-25 NOTE — ED Notes (Signed)
Informed Dr. Rhunette CroftNanavati that pt continues to complain of chest pain, Provider bedside.

## 2015-01-05 ENCOUNTER — Emergency Department (HOSPITAL_COMMUNITY): Payer: Self-pay

## 2015-01-05 ENCOUNTER — Encounter (HOSPITAL_COMMUNITY): Payer: Self-pay | Admitting: *Deleted

## 2015-01-05 ENCOUNTER — Emergency Department (HOSPITAL_COMMUNITY)
Admission: EM | Admit: 2015-01-05 | Discharge: 2015-01-06 | Disposition: A | Discharge status: Home / Self Care | Payer: Self-pay | Attending: Emergency Medicine | Admitting: Emergency Medicine

## 2015-01-05 DIAGNOSIS — Z86718 Personal history of other venous thrombosis and embolism: Secondary | ICD-10-CM | POA: Insufficient documentation

## 2015-01-05 DIAGNOSIS — Z720 Tobacco use: Secondary | ICD-10-CM | POA: Insufficient documentation

## 2015-01-05 DIAGNOSIS — Z7901 Long term (current) use of anticoagulants: Secondary | ICD-10-CM | POA: Insufficient documentation

## 2015-01-05 DIAGNOSIS — Z86711 Personal history of pulmonary embolism: Secondary | ICD-10-CM | POA: Insufficient documentation

## 2015-01-05 DIAGNOSIS — R079 Chest pain, unspecified: Secondary | ICD-10-CM | POA: Insufficient documentation

## 2015-01-05 LAB — CBC
HCT: 36.9 % — ABNORMAL LOW (ref 39.0–52.0)
Hemoglobin: 12.1 g/dL — ABNORMAL LOW (ref 13.0–17.0)
MCH: 25.9 pg — AB (ref 26.0–34.0)
MCHC: 32.8 g/dL (ref 30.0–36.0)
MCV: 78.8 fL (ref 78.0–100.0)
PLATELETS: 209 10*3/uL (ref 150–400)
RBC: 4.68 MIL/uL (ref 4.22–5.81)
RDW: 14.7 % (ref 11.5–15.5)
WBC: 5.5 10*3/uL (ref 4.0–10.5)

## 2015-01-05 LAB — BASIC METABOLIC PANEL
Anion gap: 8 (ref 5–15)
BUN: 28 mg/dL — ABNORMAL HIGH (ref 6–20)
CALCIUM: 8.7 mg/dL — AB (ref 8.9–10.3)
CO2: 25 mmol/L (ref 22–32)
CREATININE: 1.1 mg/dL (ref 0.61–1.24)
Chloride: 103 mmol/L (ref 101–111)
GFR calc Af Amer: >60 mL/min (ref >60)
GFR calc non Af Amer: >60 mL/min (ref >60)
Glucose, Bld: 110 mg/dL — ABNORMAL HIGH (ref 65–99)
Potassium: 4.4 mmol/L (ref 3.5–5.1)
SODIUM: 136 mmol/L (ref 135–145)

## 2015-01-05 LAB — TROPONIN I

## 2015-01-05 NOTE — ED Provider Notes (Signed)
CSN: [id number]     Arrival date & time 01/05/15  2154 History   First MD Initiated Contact with Patient 01/05/15 2339     Chief Complaint  Patient presents with  . Shortness of Breath   Patient is a 49 y.o. male presenting with shortness of breath. The history is provided by the patient.  Shortness of Breath Severity:  Moderate Onset quality:  Gradual Duration:  1 day Timing:  Constant Chronicity:  Recurrent Context: not activity, not URI and not weather changes   Relieved by:  Nothing Worsened by:  Nothing tried  the patient had similar symptoms on October 12.   I actually saw him in the emergency room that day. Symptoms resolved until it started again yesterday. He denies any trouble with fevers or coughing. No wheezing. No leg swelling. No recent injuries.  Past Medical History  Diagnosis Date  . PE (pulmonary embolism)   . 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 No pertinent past surgical history. No family history on file. Social History  Substance Use Topics  . Smoking status: Light Tobacco Smoker -- 0.50 packs/day    Types: Cigarettes  . Smokeless tobacco: None  . Alcohol Use: Yes    Review of Systems  Respiratory: Positive for shortness of breath.   All other systems reviewed and are negative.     Allergies  Pork-derived products  Home Medications   Prior to Admission medications   Medication Sig Start Date End Date Taking? Authorizing Provider  rivaroxaban (XARELTO) 20 MG TABS tablet Take 1 tablet (20 mg total) by mouth daily with supper. 03/05/14  Yes Quentin Angst, MD  acetaminophen (TYLENOL) 500 MG tablet Take 2 tablets (1,000 mg total) by mouth every 6 (six) hours as needed. Patient not taking: Reported on 12/25/2014 06/27/14   Arby Barrette, MD  bacitracin ointment Apply 1 application topically 2 (two) times daily. Patient not taking: Reported on 12/24/2014 06/27/14   Arby Barrette, MD  cephALEXin (KEFLEX)  500 MG capsule Take 1 capsule (500 mg total) by mouth 4 (four) times daily. Patient not taking: Reported on 01/05/2015 12/25/14   Derwood Kaplan, MD   BP 114/62 mmHg  Pulse 55  Temp(Src) 98.2 F (36.8 C) (Oral)  Resp 17  Ht  (1.676 m)  Wt 154 lb 8 oz (70.081 kg)  BMI 24.95 kg/m2  SpO2 97% Physical Exam  Constitutional: He appears well-developed and well-nourished. No distress.  HENT:  Head: Normocephalic and atraumatic.  Right Ear: External ear normal.  Left Ear: External ear normal.  Eyes: Conjunctivae are normal. Right eye exhibits no discharge. Left eye exhibits no discharge. No scleral icterus.  Neck: Neck supple. No tracheal deviation present.  Cardiovascular: Normal rate, regular rhythm and intact distal pulses.   Pulmonary/Chest: Effort normal and breath sounds normal. No stridor. No respiratory distress. He has no wheezes. He has no rales.  Abdominal: Soft. Bowel sounds are normal. He exhibits no distension. There is no tenderness. There is no rebound and no guarding.  Musculoskeletal: He exhibits no edema or tenderness.  Neurological: He is alert. He has normal strength. No cranial nerve deficit (no facial droop, extraocular movements intact, no slurred speech) or sensory deficit. He exhibits normal muscle tone. He displays no seizure activity. Coordination normal.  Skin: Skin is warm and dry. No rash noted.  Psychiatric: He has a normal mood and affect.  Nursing note and vitals reviewed.   ED Course  Procedures (including  critical care time) Labs Review Labs Reviewed  BASIC METABOLIC PANEL - Abnormal; Notable for the following:    Glucose, Bld 110 (*)    BUN 28 (*)    Calcium 8.7 (*)    All other components within normal limits  CBC - Abnormal; Notable for the following:    Hemoglobin 12.1 (*)    HCT 36.9 (*)    MCH 25.9 (*)    All other components within normal limits  TROPONIN I    Imaging Review Dg Chest 2 View  01/05/2015  CLINICAL DATA:  Acute  onset of left-sided chest pain and shortness of breath. Initial encounter. EXAM: CHEST  2 VIEW COMPARISON:  Chest radiograph performed 05/11/2014, and CTA of the chest performed 12/25/2014 FINDINGS: The lungs are well-aerated and clear. There is no evidence of focal opacification, pleural effusion or pneumothorax. The heart is normal in size; the mediastinal contour is within normal limits. No acute osseous abnormalities are seen. IMPRESSION: No acute cardiopulmonary process seen. Electronically Signed   By: Roanna RaiderJeffery  Chang M.D.   On: 01/05/2015 22:53   I have personally reviewed and evaluated these images and lab results as part of my medical decision-making.   EKG Interpretation   Date/Time:  Sunday January 05 2015 21:58:05 EDT Ventricular Rate:  63 PR Interval:  130 QRS Duration: 82 QT Interval:  386 QTC Calculation: 395 R Axis:   61 Text Interpretation:  Normal sinus rhythm Nonspecific T wave abnormality  Abnormal ECG No significant change since last tracing Confirmed by Deuntae Kocsis   MD-J, Izzy Doubek ([id number]) on 01/05/2015 11:07:20 PM      MDM   Final diagnoses:  Chest pain, unspecified chest pain type    I saw the patient on October 13 for the same complaints.  The patient was also having issues with hematuria at that time but those symptoms have resolved.  Patient states left side of his chest continues to hurt. It's sharp. He also feels like he is short of breath. Objectively the patient is not tachypnea. He is breathing easily. His oxygen saturation is 97%. The patient had a CT angiogram of the chest performed on October 12 and there is no evidence of pulmonary embolism.  Patient thinks his symptoms are related to his xarelto. The patient states all his symptoms started after he began taking that medication.  I am unable to convince the patient that he had those symptoms because of the pulmonary embolisms and he started taking Xarelto for that illness.    I suggested he see his primary doctor  to discuss an alternative medication.  I told him not to stop that medication on his own.    Linwood DibblesJon Iliani Vejar, MD 01/06/15 [phone]

## 2015-01-05 NOTE — ED Notes (Signed)
The pt is c/o some  Sob and chest discomfort since yesterday.  Hx of pes

## 2015-01-06 NOTE — Discharge Instructions (Signed)
Nonspecific Chest Pain  °Chest pain can be caused by many different conditions. There is always a chance that your pain could be related to something serious, such as a heart attack or a blood clot in your lungs. Chest pain can also be caused by conditions that are not life-threatening. If you have chest pain, it is very important to follow up with your health care provider. °CAUSES  °Chest pain can be caused by: °· Heartburn. °· Pneumonia or bronchitis. °· Anxiety or stress. °· Inflammation around your heart (pericarditis) or lung (pleuritis or pleurisy). °· A blood clot in your lung. °· A collapsed lung (pneumothorax). It can develop suddenly on its own (spontaneous pneumothorax) or from trauma to the chest. °· Shingles infection (varicella-zoster virus). °· Heart attack. °· Damage to the bones, muscles, and cartilage that make up your chest wall. This can include: °¨ Bruised bones due to injury. °¨ Strained muscles or cartilage due to frequent or repeated coughing or overwork. °¨ Fracture to one or more ribs. °¨ Sore cartilage due to inflammation (costochondritis). °RISK FACTORS  °Risk factors for chest pain may include: °· Activities that increase your risk for trauma or injury to your chest. °· Respiratory infections or conditions that cause frequent coughing. °· Medical conditions or overeating that can cause heartburn. °· Heart disease or family history of heart disease. °· Conditions or health behaviors that increase your risk of developing a blood clot. °· Having had chicken pox (varicella zoster). °SIGNS AND SYMPTOMS °Chest pain can feel like: °· Burning or tingling on the surface of your chest or deep in your chest. °· Crushing, pressure, aching, or squeezing pain. °· Dull or sharp pain that is worse when you move, cough, or take a deep breath. °· Pain that is also felt in your back, neck, shoulder, or arm, or pain that spreads to any of these areas. °Your chest pain may come and go, or it may stay  constant. °DIAGNOSIS °Lab tests or other studies may be needed to find the cause of your pain. Your health care provider may have you take a test called an ambulatory ECG (electrocardiogram). An ECG records your heartbeat patterns at the time the test is performed. You may also have other tests, such as: °· Transthoracic echocardiogram (TTE). During echocardiography, sound waves are used to create a picture of all of the heart structures and to look at how blood flows through your heart. °· Transesophageal echocardiogram (TEE). This is a more advanced imaging test that obtains images from inside your body. It allows your health care provider to see your heart in finer detail. °· Cardiac monitoring. This allows your health care provider to monitor your heart rate and rhythm in real time. °· Holter monitor. This is a portable device that records your heartbeat and can help to diagnose abnormal heartbeats. It allows your health care provider to track your heart activity for several days, if needed. °· Stress tests. These can be done through exercise or by taking medicine that makes your heart beat more quickly. °· Blood tests. °· Imaging tests. °TREATMENT  °Your treatment depends on what is causing your chest pain. Treatment may include: °· Medicines. These may include: °¨ Acid blockers for heartburn. °¨ Anti-inflammatory medicine. °¨ Pain medicine for inflammatory conditions. °¨ Antibiotic medicine, if an infection is present. °¨ Medicines to dissolve blood clots. °¨ Medicines to treat coronary artery disease. °· Supportive care for conditions that do not require medicines. This may include: °¨ Resting. °¨ Applying heat   or cold packs to injured areas. °¨ Limiting activities until pain decreases. °HOME CARE INSTRUCTIONS °· If you were prescribed an antibiotic medicine, finish it all even if you start to feel better. °· Avoid any activities that bring on chest pain. °· Do not use any tobacco products, including  cigarettes, chewing tobacco, or electronic cigarettes. If you need help quitting, ask your health care provider. °· Do not drink alcohol. °· Take medicines only as directed by your health care provider. °· Keep all follow-up visits as directed by your health care provider. This is important. This includes any further testing if your chest pain does not go away. °· If heartburn is the cause for your chest pain, you may be told to keep your head raised (elevated) while sleeping. This reduces the chance that acid will go from your stomach into your esophagus. °· Make lifestyle changes as directed by your health care provider. These may include: °¨ Getting regular exercise. Ask your health care provider to suggest some activities that are safe for you. °¨ Eating a heart-healthy diet. A registered dietitian can help you to learn healthy eating options. °¨ Maintaining a healthy weight. °¨ Managing diabetes, if necessary. °¨ Reducing stress. °SEEK MEDICAL CARE IF: °· Your chest pain does not go away after treatment. °· You have a rash with blisters on your chest. °· You have a fever. °SEEK IMMEDIATE MEDICAL CARE IF:  °· Your chest pain is worse. °· You have an increasing cough, or you cough up blood. °· You have severe abdominal pain. °· You have severe weakness. °· You faint. °· You have chills. °· You have sudden, unexplained chest discomfort. °· You have sudden, unexplained discomfort in your arms, back, neck, or jaw. °· You have shortness of breath at any time. °· You suddenly start to sweat, or your skin gets clammy. °· You feel nauseous or you vomit. °· You suddenly feel light-headed or dizzy. °· Your heart begins to beat quickly, or it feels like it is skipping beats. °These symptoms may represent a serious problem that is an emergency. Do not wait to see if the symptoms will go away. Get medical help right away. Call your local emergency services (911 in the [place] Do not drive yourself to the hospital. °  °This  information is not intended to replace advice given to you by your health care provider. Make sure you discuss any questions you have with your health care provider. °  °Document Released: 12/09/2004 Document Revised: 03/22/2014 Document Reviewed: 10/05/2013 °Elsevier Interactive Patient Education ©2016 [employer] ° °

## 2015-02-18 DIAGNOSIS — Z139 Encounter for screening, unspecified: Secondary | ICD-10-CM

## 2015-02-24 NOTE — Congregational Nurse Program (Signed)
Congregational Nurse Program Note  Date of Encounter: 02/18/2015  Past Medical History: Past Medical History  Diagnosis Date  . PE (pulmonary embolism)   . 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 Existing   Patient is considered a/an Not Applicable   Patient Assistance   Patient's financial/insurance status Uninsured;Low Income   Patient referred to apply for the following financial assistance Not Applicable   Food insecurities addressed Provided food supplies   Transportation assistance No   Assistance securing medications No   Educational health offerings Not Applicable   Encounter Details   Primary purpose of visit Education/Health Concerns   Was an Emergency Department visit averted? Not Applicable   Does patient have a medical provider? Yes   Patient referred to Not Applicable   Was a mental health screening completed? (GAINS tool) No   Does patient have dental issues? No   Since previous encounter, have you referred patient for abnormal blood pressure that resulted in a new diagnosis or medication change? No   Since previous encounter, have you referred patient for abnormal blood glucose that resulted in a new diagnosis or medication change? No   For Abstraction Use Only   Does patient have insurance? No       Clinic visit for B/P check.  Informed client B/P on the low side of normal.  Encouraged him to increase fluid intake and have B/P rechecked next week.  He denies feeling weak or dizzy.

## 2015-04-21 ENCOUNTER — Emergency Department (HOSPITAL_COMMUNITY): Payer: Self-pay

## 2015-04-21 ENCOUNTER — Encounter (HOSPITAL_COMMUNITY): Payer: Self-pay | Admitting: Emergency Medicine

## 2015-04-21 DIAGNOSIS — R040 Epistaxis: Secondary | ICD-10-CM | POA: Insufficient documentation

## 2015-04-21 DIAGNOSIS — Z86718 Personal history of other venous thrombosis and embolism: Secondary | ICD-10-CM | POA: Insufficient documentation

## 2015-04-21 DIAGNOSIS — Z86711 Personal history of pulmonary embolism: Secondary | ICD-10-CM | POA: Insufficient documentation

## 2015-04-21 DIAGNOSIS — F17210 Nicotine dependence, cigarettes, uncomplicated: Secondary | ICD-10-CM | POA: Insufficient documentation

## 2015-04-21 DIAGNOSIS — B349 Viral infection, unspecified: Secondary | ICD-10-CM | POA: Insufficient documentation

## 2015-04-21 DIAGNOSIS — R5084 Febrile nonhemolytic transfusion reaction: Secondary | ICD-10-CM | POA: Insufficient documentation

## 2015-04-21 DIAGNOSIS — E860 Dehydration: Secondary | ICD-10-CM | POA: Insufficient documentation

## 2015-04-21 LAB — COMPREHENSIVE METABOLIC PANEL
ALBUMIN: 3.7 g/dL (ref 3.5–5.0)
ALK PHOS: 46 U/L (ref 38–126)
ALT: 78 U/L — ABNORMAL HIGH (ref 17–63)
ANION GAP: 15 (ref 5–15)
AST: 96 U/L — ABNORMAL HIGH (ref 15–41)
BUN: 19 mg/dL (ref 6–20)
CALCIUM: 8.9 mg/dL (ref 8.9–10.3)
CHLORIDE: 98 mmol/L — AB (ref 101–111)
CO2: 24 mmol/L (ref 22–32)
Creatinine, Ser: 1.51 mg/dL — ABNORMAL HIGH (ref 0.61–1.24)
GFR calc non Af Amer: 52 mL/min — ABNORMAL LOW (ref >60)
GLUCOSE: 146 mg/dL — AB (ref 65–99)
POTASSIUM: 4.5 mmol/L (ref 3.5–5.1)
SODIUM: 137 mmol/L (ref 135–145)
Total Bilirubin: 0.9 mg/dL (ref 0.3–1.2)
Total Protein: 6.1 g/dL — ABNORMAL LOW (ref 6.5–8.1)

## 2015-04-21 LAB — CBC WITH DIFFERENTIAL/PLATELET
BASOS PCT: 0 %
Basophils Absolute: 0 10*3/uL (ref 0.0–0.1)
EOS ABS: 0 10*3/uL (ref 0.0–0.7)
EOS PCT: 0 %
HCT: 41.6 % (ref 39.0–52.0)
HEMOGLOBIN: 13.5 g/dL (ref 13.0–17.0)
LYMPHS ABS: 1 10*3/uL (ref 0.7–4.0)
Lymphocytes Relative: 24 %
MCH: 25 pg — AB (ref 26.0–34.0)
MCHC: 32.5 g/dL (ref 30.0–36.0)
MCV: 77.2 fL — ABNORMAL LOW (ref 78.0–100.0)
MONO ABS: 0.6 10*3/uL (ref 0.1–1.0)
MONOS PCT: 13 %
NEUTROS PCT: 63 %
Neutro Abs: 2.6 10*3/uL (ref 1.7–7.7)
PLATELETS: 147 10*3/uL — AB (ref 150–400)
RBC: 5.39 MIL/uL (ref 4.22–5.81)
RDW: 14.3 % (ref 11.5–15.5)
WBC: 4.2 10*3/uL (ref 4.0–10.5)

## 2015-04-21 MED ORDER — ACETAMINOPHEN 325 MG PO TABS
650.0000 mg | ORAL_TABLET | Freq: Once | ORAL | Status: AC
  Administered 2015-04-21: 650 mg via ORAL

## 2015-04-21 MED ORDER — ACETAMINOPHEN 325 MG PO TABS
ORAL_TABLET | ORAL | Status: AC
  Filled 2015-04-21: qty 2

## 2015-04-21 NOTE — ED Notes (Signed)
Pt. presents with multiple complaints ; fatigue , generalized body aches , headache , fever , chills , diarrhea , emesis , productive cough and poor appetite onset last week .

## 2015-04-22 ENCOUNTER — Emergency Department (HOSPITAL_COMMUNITY)
Admission: EM | Admit: 2015-04-22 | Discharge: 2015-04-22 | Disposition: A | Discharge status: Home / Self Care | Payer: Self-pay | Attending: Emergency Medicine | Admitting: Emergency Medicine

## 2015-04-22 DIAGNOSIS — Z5900 Homelessness unspecified: Secondary | ICD-10-CM

## 2015-04-22 DIAGNOSIS — R509 Fever, unspecified: Secondary | ICD-10-CM

## 2015-04-22 DIAGNOSIS — B349 Viral infection, unspecified: Secondary | ICD-10-CM

## 2015-04-22 DIAGNOSIS — E860 Dehydration: Secondary | ICD-10-CM

## 2015-04-22 LAB — URINALYSIS, ROUTINE W REFLEX MICROSCOPIC
BILIRUBIN URINE: NEGATIVE
Glucose, UA: NEGATIVE mg/dL
HGB URINE DIPSTICK: NEGATIVE
Ketones, ur: NEGATIVE mg/dL
Leukocytes, UA: NEGATIVE
Nitrite: NEGATIVE
PH: 5.5 (ref 5.0–8.0)
Protein, ur: NEGATIVE mg/dL
SPECIFIC GRAVITY, URINE: 1.018 (ref 1.005–1.030)

## 2015-04-22 LAB — RAPID STREP SCREEN (MED CTR MEBANE ONLY): Streptococcus, Group A Screen (Direct): NEGATIVE

## 2015-04-22 MED ORDER — ONDANSETRON HCL 4 MG/2ML IJ SOLN
4.0000 mg | Freq: Once | INTRAMUSCULAR | Status: AC
  Administered 2015-04-22: 4 mg via INTRAVENOUS
  Filled 2015-04-22: qty 2

## 2015-04-22 MED ORDER — ACETAMINOPHEN 325 MG PO TABS: 650.0000 mg | ORAL_TABLET | Freq: Four times a day (QID) | ORAL | Status: DC | PRN

## 2015-04-22 MED ORDER — ONDANSETRON HCL 4 MG PO TABS: 4.0000 mg | ORAL_TABLET | Freq: Three times a day (TID) | ORAL | Status: DC | PRN

## 2015-04-22 MED ORDER — SODIUM CHLORIDE 0.9 % IV BOLUS (SEPSIS)
1000.0000 mL | Freq: Once | INTRAVENOUS | Status: AC
  Administered 2015-04-22: 1000 mL via INTRAVENOUS

## 2015-04-22 MED ORDER — MORPHINE SULFATE (PF) 4 MG/ML IV SOLN
4.0000 mg | Freq: Once | INTRAVENOUS | Status: AC
  Administered 2015-04-22: 4 mg via INTRAVENOUS
  Filled 2015-04-22: qty 1

## 2015-04-22 MED FILL — ONDANSETRON HCL 4 MG TABLET: 4 | 7 days supply | Qty: 20 | Fill #0

## 2015-04-22 MED FILL — MAPAP 325 MG TABLET: 325 | 4 days supply | Qty: 30 | Fill #0

## 2015-04-22 NOTE — ED Notes (Signed)
Patient verbalized understanding of discharge instructions and denies any further needs or questions at this time. VS stable. Patient ambulatory with steady gait.  

## 2015-04-22 NOTE — ED Provider Notes (Signed)
CSN: [id number]     Arrival date & time 04/21/15  1858 History  By signing my name below, I, Rohini Rajnarayanan, attest that this documentation has been prepared under the direction and in the presence of Shon Baton, MD Electronically Signed: Charlean Merl, ED Scribe 04/22/2015 at 12:51 AM.    Chief Complaint  Patient presents with  . Fatigue  . Generalized Body Aches  . Fever  . Epistaxis   The history is provided by the patient. No language interpreter was used.   HPI Comments: Savas Elvin is a 50 y.o. male who presents to the Emergency Department complaining of fatigue, generalized body aches, headache, fever, chills, diarrhea, emesis, productive cough, epistaxis, confusion, dizziness, sore throat and poor appetite which began on 2/4. Pt also reports abdominal pain upon vomiting. Pt tried tot take Vicks with no little relief. Pt has no sick contacts.  Chills without documented fevers at home.  Onset of symptoms last week. Pt denies any SOB or CP.   Past Medical History  Diagnosis Date  . PE (pulmonary embolism)   . 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 No pertinent past surgical history. No family history on file. Social History  Substance Use Topics  . Smoking status: Light Tobacco Smoker -- 0.00 packs/day    Types: Cigarettes  . Smokeless tobacco: None  . Alcohol Use: Yes    Review of Systems  Constitutional: Positive for fever, chills, appetite change and fatigue.  HENT: Positive for nosebleeds and sore throat.   Respiratory: Positive for cough (Productive). Negative for shortness of breath.   Cardiovascular: Negative for chest pain.  Gastrointestinal: Positive for vomiting, abdominal pain and diarrhea.  Musculoskeletal: Positive for myalgias. Negative for neck pain and neck stiffness.  Neurological: Positive for dizziness, weakness and headaches.  All other systems reviewed and are negative.   Allergies  Pork-derived  products  Home Medications   Prior to Admission medications   Medication Sig Start Date End Date Taking? Authorizing Provider  acetaminophen (TYLENOL) 325 MG tablet Take 2 tablets (650 mg total) by mouth every 6 (six) hours as needed for fever. 04/22/15   Shon Baton, MD  ondansetron (ZOFRAN) 4 MG tablet Take 1 tablet (4 mg total) by mouth every 8 (eight) hours as needed for nausea or vomiting. 04/22/15   Shon Baton, MD   BP 112/71 mmHg  Pulse 66  Temp(Src) 98.3 F (36.8 C) (Oral)  Resp 16  Ht  (1.702 m)  Wt 141 lb 5 oz (64.099 kg)  BMI 22.13 kg/m2  SpO2 100% Physical Exam  Constitutional: He is oriented to person, place, and time. He appears well-developed and well-nourished.  HENT:  Head: Normocephalic and atraumatic.  Mucous membrane dry, mild erythematous posterior oropharynx, no tonsillar exudate  Eyes: Pupils are equal, round, and reactive to light.  Neck: Neck supple.  Normal range of motion, no meningismus  Cardiovascular: Normal rate, regular rhythm and normal heart sounds.   No murmur heard. Pulmonary/Chest: Effort normal and breath sounds normal. No respiratory distress. He has no wheezes.  Abdominal: Soft. Bowel sounds are normal. There is no tenderness. There is no rebound and no guarding.  Musculoskeletal: He exhibits no edema.  Lymphadenopathy:    He has no cervical adenopathy.  Neurological: He is alert and oriented to person, place, and time.  Skin: Skin is warm and dry.  Psychiatric: He has a normal mood and affect.  Nursing note and vitals reviewed.  ED Course  Procedures  DIAGNOSTIC STUDIES: Oxygen Saturation is 100% on RA, normal by my interpretation.    COORDINATION OF CARE: 12:25 AM-Discussed treatment plan which includes performing a DG Chest, blood work, and urinalysis, and givingTylenol with pt at bedside and pt agreed to plan.   Labs Review Labs Reviewed  CBC WITH DIFFERENTIAL/PLATELET - Abnormal; Notable for the following:     MCV 77.2 (*)    MCH 25.0 (*)    Platelets 147 (*)    All other components within normal limits  COMPREHENSIVE METABOLIC PANEL - Abnormal; Notable for the following:    Chloride 98 (*)    Glucose, Bld 146 (*)    Creatinine, Ser 1.51 (*)    Total Protein 6.1 (*)    AST 96 (*)    ALT 78 (*)    GFR calc non Af Amer 52 (*)    All other components within normal limits  URINALYSIS, ROUTINE W REFLEX MICROSCOPIC (NOT AT [facility]) - Abnormal; Notable for the following:    APPearance CLOUDY (*)    All other components within normal limits  RAPID STREP SCREEN (NOT AT [facility])  CULTURE, GROUP A STREP [facility])    Imaging Review Dg Chest 2 View  04/21/2015  CLINICAL DATA:  Pt. presents with multiple complaints ; fatigue , generalized body aches , headache , fever , chills , diarrhea , emesis , productive cough and poor appetite onset last week . Hx PE about a year and a half ago EXAM: CHEST  2 VIEW COMPARISON:  01/05/2015 FINDINGS: Moderate thoracic spondylosis. Midline trachea. Normal heart size and mediastinal contours. No pleural effusion or pneumothorax. Clear lungs. IMPRESSION: No acute cardiopulmonary disease. Electronically Signed   By: Jeronimo Greaves M.D.   On: 04/21/2015 20:29   I have personally reviewed and evaluated these images and lab results as part of my medical decision-making.   EKG Interpretation None      MDM   Final diagnoses:  Other specified fever  Viral syndrome  Dehydration   Patient presents with multiple complaints including chills, body aches, headache, sore throat. Nontoxic on exam. Does appear dehydrated.  Basic labwork obtained. Creatinine 1.51.  Baseline around 1. Otherwise lab work is reassuring. Strep screen is negative. No evidence of pneumonia on chest x-ray. He is out of the window for Tamiflu. Discussed with the patient that he likely had a virus.  Patient was given fluids and Zofran. He was able to orally hydrate. Discuss with patient supportive measures at home  including Zofran for nausea and Tylenol for fevers. He was given return precautions.  After history, exam, and medical workup I feel the patient has been appropriately medically screened and is safe for discharge home. Pertinent diagnoses were discussed with the patient. Patient was given return precautions.  I personally performed the services described in this documentation, which was scribed in my presence. The recorded information has been reviewed and is accurate.      Shon Baton, MD 04/22/15 [phone]

## 2015-04-22 NOTE — Congregational Nurse Program (Signed)
Congregational Nurse Program Note  Date of Encounter: 04/22/2015  Past Medical History: Past Medical History  Diagnosis Date  . PE (pulmonary embolism)   . 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 New   Patient is considered a/an Not Applicable   Race African-American/Black   Patient Assistance   Location of Patient Assistance Not Applicable   Patient's financial/insurance status Low Income;Self-Pay   Uninsured Patient Yes   Interventions Counseled to make appt. with provider   Patient referred to apply for the following financial assistance Not Applicable   Food insecurities addressed Provided food supplies   Transportation assistance No   Assistance securing medications Yes   Type of Assistance [facility]   Educational health offerings Acute disease   Encounter Details   Primary purpose of visit Acute Illness/Condition Visit;Post ED/Hospitalization Visit   Was an Emergency Department visit averted? Not Applicable   Does patient have a medical provider? No   Patient referred to Clinic   Was a mental health screening completed? (GAINS tool) No   Does patient have dental issues? No   Does patient have vision issues? No   Since previous encounter, have you referred patient for abnormal blood pressure that resulted in a new diagnosis or medication change? No   Since previous encounter, have you referred patient for abnormal blood glucose that resulted in a new diagnosis or medication change? No   For Abstraction Use Only   Does patient have insurance? No     Clinic visit for F/U post ED visit.  Dx. With virus - fever and nausea and vomiting.  Client needing assistance with obtaining scripts from the ED.  Medications obtain from [facility].  Client given instructions.  Encouraged to push fluids.

## 2015-04-22 NOTE — ED Notes (Signed)
Provided patient with [place] sandwich and gingerale, tolerating with no problems, denies N/V.

## 2015-04-22 NOTE — Discharge Instructions (Signed)
Viral Infections °A viral infection can be caused by different types of viruses. Most viral infections are not serious and resolve on their own. However, some infections may cause severe symptoms and may lead to further complications. °SYMPTOMS °Viruses can frequently cause: °· Minor sore throat. °· Aches and pains. °· Headaches. °· Runny nose. °· Different types of rashes. °· Watery eyes. °· Tiredness. °· Cough. °· Loss of appetite. °· Gastrointestinal infections, resulting in nausea, vomiting, and diarrhea. °These symptoms do not respond to antibiotics because the infection is not caused by bacteria. However, you might catch a bacterial infection following the viral infection. This is sometimes called a "superinfection." Symptoms of such a bacterial infection may include: °· Worsening sore throat with pus and difficulty swallowing. °· Swollen neck glands. °· Chills and a high or persistent fever. °· Severe headache. °· Tenderness over the sinuses. °· Persistent overall ill feeling (malaise), muscle aches, and tiredness (fatigue). °· Persistent cough. °· Yellow, green, or brown mucus production with coughing. °HOME CARE INSTRUCTIONS  °· Only take over-the-counter or prescription medicines for pain, discomfort, diarrhea, or fever as directed by your caregiver. °· Drink enough water and fluids to keep your urine clear or pale yellow. Sports drinks can provide valuable electrolytes, sugars, and hydration. °· Get plenty of rest and maintain proper nutrition. Soups and broths with crackers or rice are fine. °SEEK IMMEDIATE MEDICAL CARE IF:  °· You have severe headaches, shortness of breath, chest pain, neck pain, or an unusual rash. °· You have uncontrolled vomiting, diarrhea, or you are unable to keep down fluids. °· You or your child has an oral temperature above 102° F (38.9° C), not controlled by medicine. °· Your baby is older than 3 months with a rectal temperature of 102° F (38.9° C) or higher. °· Your baby is 3  months old or younger with a rectal temperature of 100.4° F (38° C) or higher. °MAKE SURE YOU:  °· Understand these instructions. °· Will watch your condition. °· Will get help right away if you are not doing well or get worse. °  °This information is not intended to replace advice given to you by your health care provider. Make sure you discuss any questions you have with your health care provider. °  °Document Released: 12/09/2004 Document Revised: 05/24/2011 Document Reviewed: 08/07/2014 °Elsevier Interactive Patient Education ©2016 [employer] ° °

## 2015-04-24 LAB — CULTURE, GROUP A STREP (THRC)

## 2015-04-29 DIAGNOSIS — Z5900 Homelessness unspecified: Secondary | ICD-10-CM

## 2015-05-06 NOTE — Congregational Nurse Program (Signed)
Congregational Nurse Program Note  Date of Encounter: 04/29/2015  Past Medical History: Past Medical History  Diagnosis Date  . PE (pulmonary embolism)   . DVT of lower extremity (deep venous thrombosis) (HCC)     Encounter Details:     CNP Questionnaire - 04/29/15 1518    Patient Demographics   Is this a new or existing patient? New   Patient is considered a/an Not Applicable   Race African-American/Black   Patient Assistance   Location of Patient Assistance Not Applicable   Patient's financial/insurance status Low Income;Self-Pay   Uninsured Patient Yes   Interventions Counseled to make appt. with provider;Referred to ED/Urgent Care   Patient referred to apply for the following financial assistance Not Applicable   Food insecurities addressed Provided food supplies   Transportation assistance No   Assistance securing medications Yes   Type of Assistance [facility]   Educational health offerings Acute disease;Navigating the healthcare system;Chronic disease   Encounter Details   Primary purpose of visit Acute Illness/Condition Visit   Was an Emergency Department visit averted? Not Applicable   Does patient have a medical provider? No   Patient referred to Clinic;Urgent Care   Was a mental health screening completed? (GAINS tool) No   Does patient have dental issues? No   Does patient have vision issues? No   Since previous encounter, have you referred patient for abnormal blood pressure that resulted in a new diagnosis or medication change? No   Since previous encounter, have you referred patient for abnormal blood glucose that resulted in a new diagnosis or medication change? No   For Abstraction Use Only   Does patient have insurance? No     c/o pain left ankle, lateral side radiating up lower calf.  Denies injury.  States has had a DVT in the past. Instructed to go to urgent care due to history.  Discussed with him the need for a primary provider.  Instructed to  return to clinic after seen for leg pain to discuss obtaining a PCP.

## 2015-07-05 IMAGING — CT CT ANGIO CHEST
2 of 9 series · 18 of 46 positions shown · IV contrast (Omni 300)
Comparison: Prior radiograph from 01/15/2014

CLINICAL DATA: Initial value a shin for shortness of breath and
chest pain for past 4 days. History of pulmonary embolism.

EXAM:
CT ANGIOGRAPHY CHEST WITH CONTRAST
TECHNIQUE: Multidetector CT imaging of the chest was performed using the
standard protocol during bolus administration of intravenous
contrast. Multiplanar CT image reconstructions and MIPs were
obtained to evaluate the vascular anatomy.
CONTRAST:  100mL OMNIPAQUE IOHEXOL 350 MG/ML SOLN

[Series 5: thins · axial · 0.67mm/px · z∈[-265,-22]mm · 15 of 275 slices shown]
[im 16/275  lung]
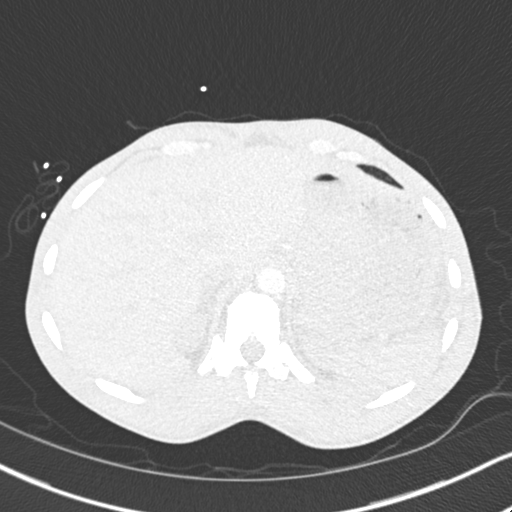
[im 31/275  soft-tissue]
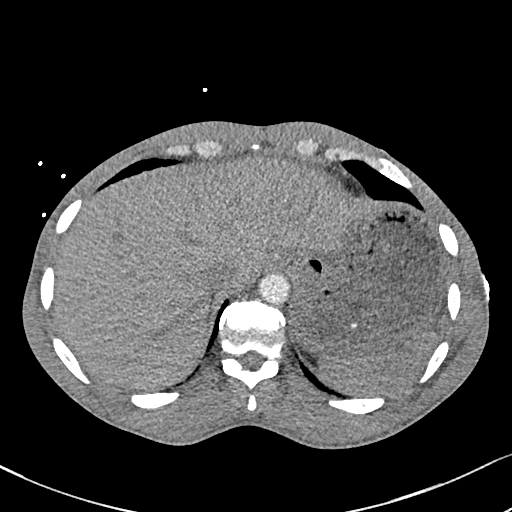
[im 46/275  lung]
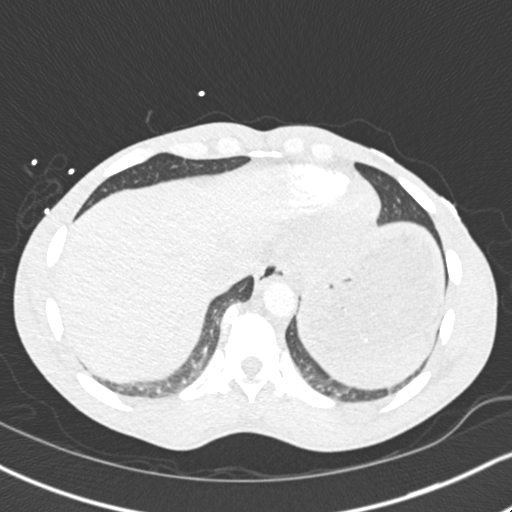
[im 61/275  soft-tissue]
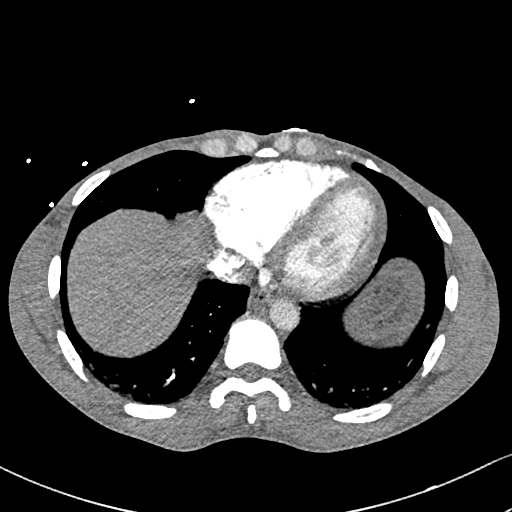
[im 92/275  lung]
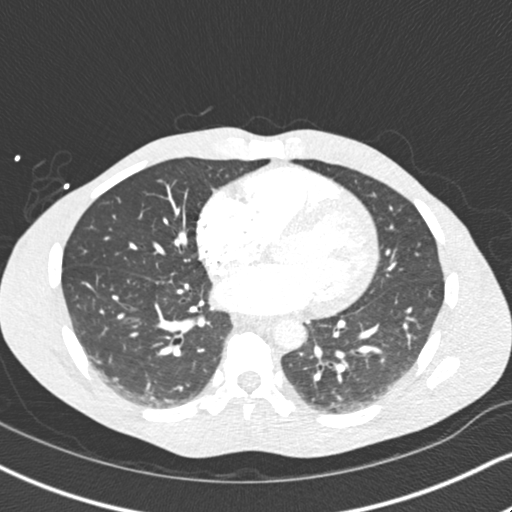
[im 107/275  soft-tissue]
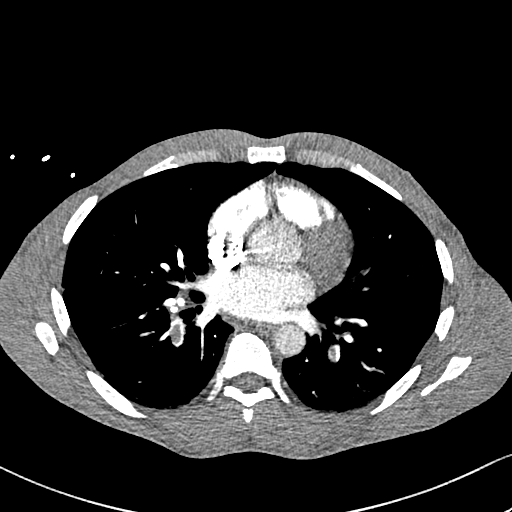
[im 122/275  lung]
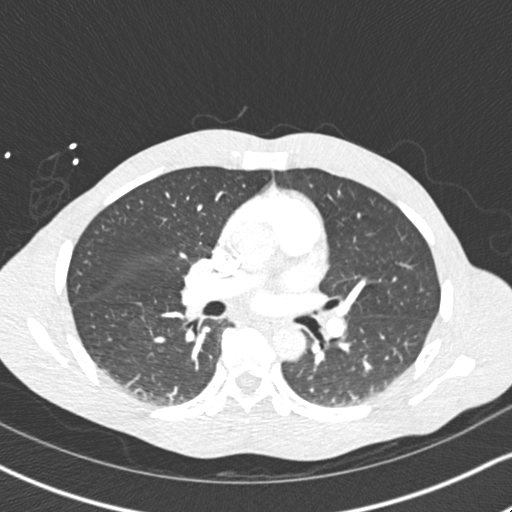
[im 138/275  soft-tissue]
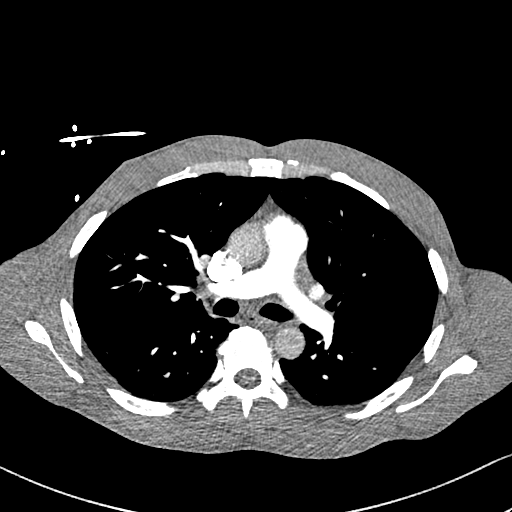
[im 153/275  lung]
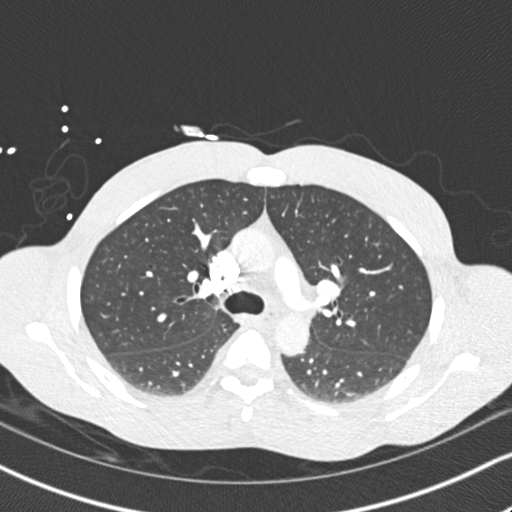
[im 168/275  soft-tissue]
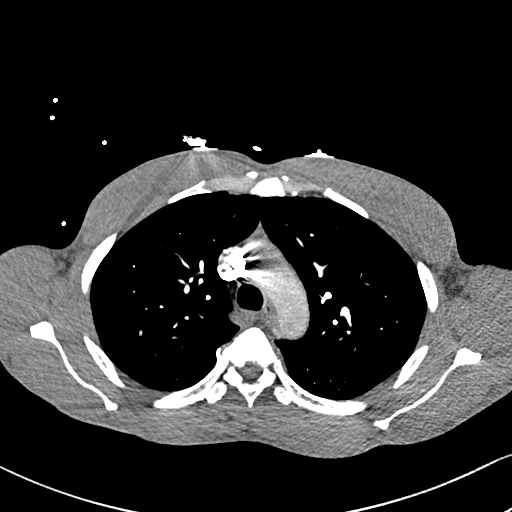
[im 183/275  lung]
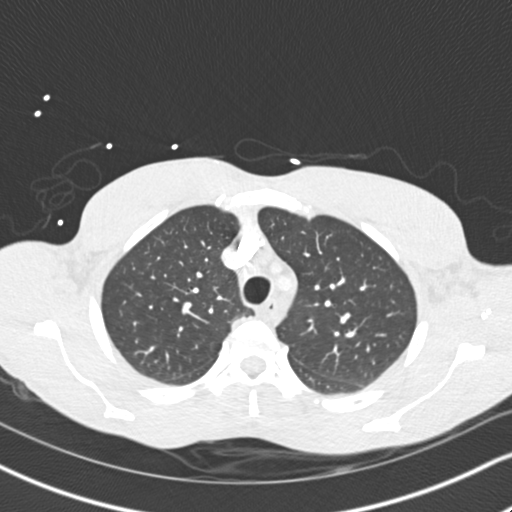
[im 214/275  soft-tissue]
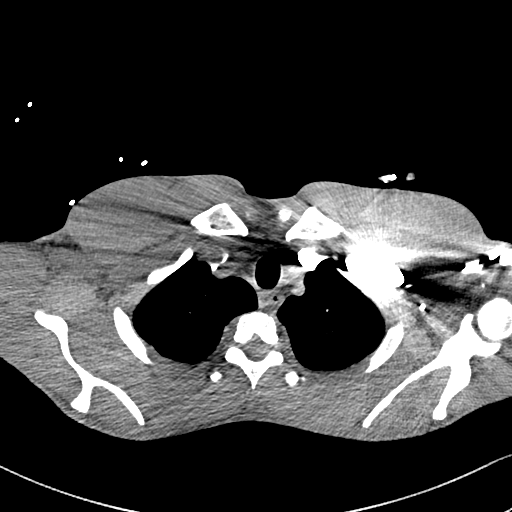
[im 229/275  lung]
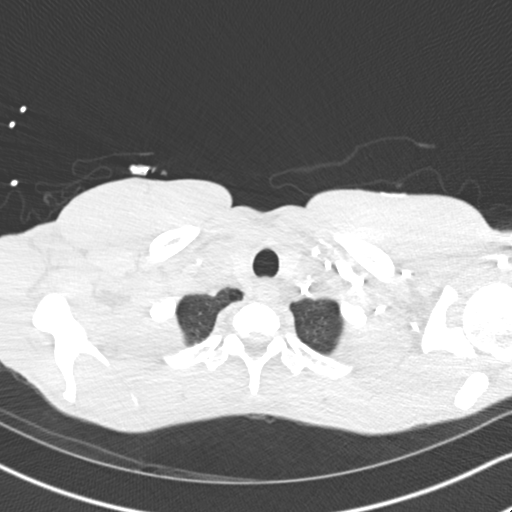
[im 244/275  soft-tissue]
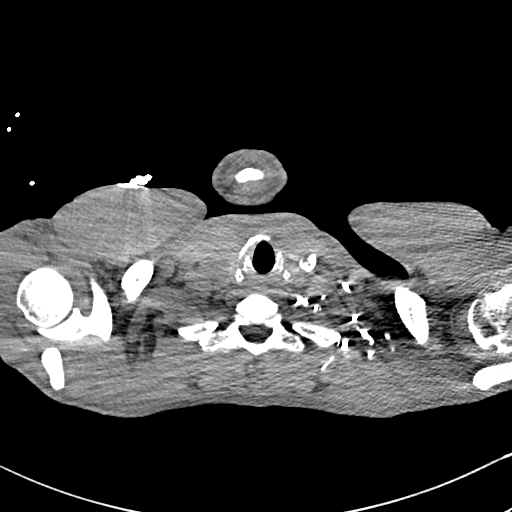
[im 259/275  lung]
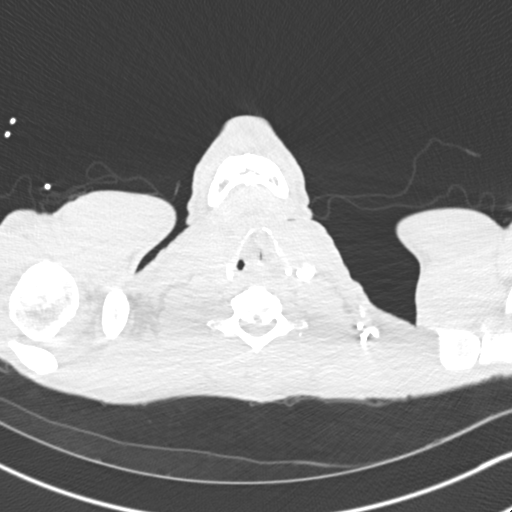

[Series 7: coronal mpr · coronal · 0.57mm/px · 3 of 100 slices shown]
[im 25/100  soft-tissue]
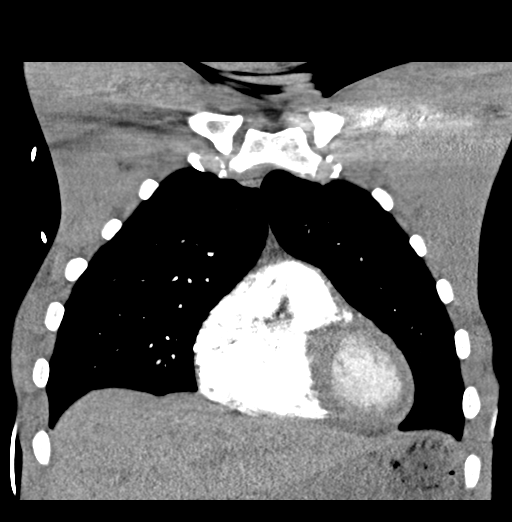
[im 50/100  soft-tissue]
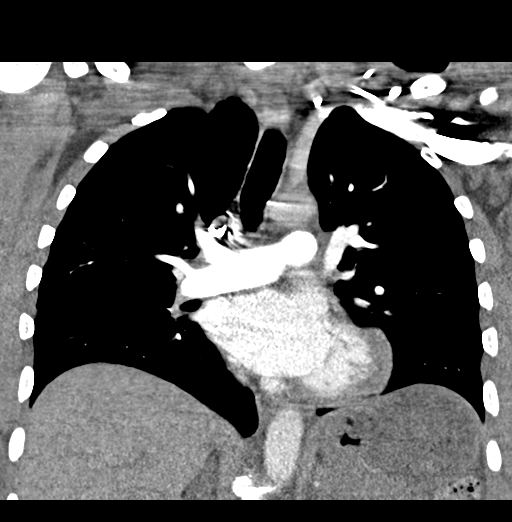
[im 75/100  soft-tissue]
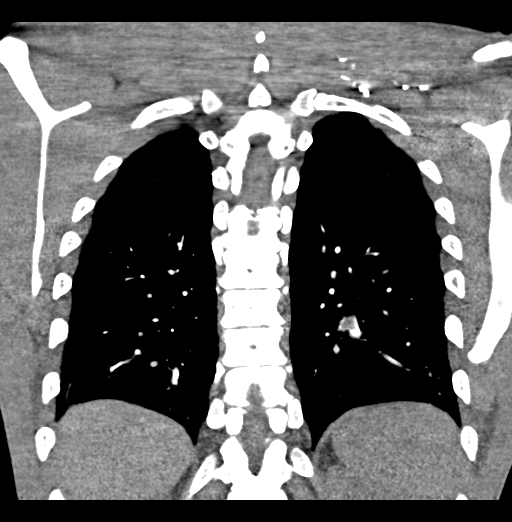

[18 of 46 positions shown; findings below may reference images not displayed]

FINDINGS: Thyroid gland within normal limits. No pathologically enlarged
mediastinal, hilar, or axillary lymph nodes are identified.

Intrathoracic aorta is of normal caliber and appearance. Great
vessels within normal limits.

Heart size is normal.

There are nonocclusive filling defects within several segmental
pulmonary arteries supplying the right lower lobe (series 4, image
56). Additional filling defects present within segmental pulmonary
arteries supplying the left lower lobe (series 4, image 58). There
is an additional emboli within left upper lobe segmental pulmonary
artery (series 7, image 47) as well as right upper lobe segmental
pulmonary artery (series 7, image 55). Findings consistent with
acute pulmonary in emboli. Main pulmonary artery measures within
normal limits at 2.1 cm. RV to LV ratio is elevated at 1.0, which
may reflect right heart strain. There is mild straightening of the
intraventricular septum as well.

Lungs are clear without focal infiltrate. No evidence for pulmonary
infarct. Subsegmental atelectasis seen dependently within the lower
lobes bilaterally. No pulmonary edema or pleural effusion. No
pneumothorax.

Visualized upper abdomen within normal limits.

No acute osseous abnormality. No worrisome lytic or blastic osseous
lesions.
IMPRESSION: 1. Acute pulmonary emboli involving multiple segmental pulmonary
arteries supplying the bilateral upper and lower lobes as detailed
above. Elevated RV to LV ratio of 1.0 suggests possible right heart
strain.
2. No evidence for pulmonary infarct.
3. No other acute cardiopulmonary abnormality.
Critical Value/emergent results were called by telephone at the time
of interpretation on 01/16/2014 at [DATE] to the nurse practitioner
taking care of the patient, Lagnidila Azilg, who verbally acknowledged
these results.

## 2016-01-09 ENCOUNTER — Emergency Department (HOSPITAL_COMMUNITY)
Admission: EM | Admit: 2016-01-09 | Discharge: 2016-01-10 | Disposition: A | Discharge status: Home / Self Care | Payer: Self-pay | Attending: Emergency Medicine | Admitting: Emergency Medicine

## 2016-01-09 ENCOUNTER — Emergency Department (HOSPITAL_COMMUNITY): Payer: Self-pay

## 2016-01-09 ENCOUNTER — Encounter (HOSPITAL_COMMUNITY): Payer: Self-pay | Admitting: Emergency Medicine

## 2016-01-09 ENCOUNTER — Emergency Department (HOSPITAL_COMMUNITY)
Admission: EM | Admit: 2016-01-09 | Discharge: 2016-01-09 | Disposition: A | Discharge status: Home / Self Care | Payer: Self-pay | Attending: Emergency Medicine | Admitting: Emergency Medicine

## 2016-01-09 DIAGNOSIS — D649 Anemia, unspecified: Secondary | ICD-10-CM

## 2016-01-09 DIAGNOSIS — F17210 Nicotine dependence, cigarettes, uncomplicated: Secondary | ICD-10-CM | POA: Insufficient documentation

## 2016-01-09 DIAGNOSIS — K2900 Acute gastritis without bleeding: Secondary | ICD-10-CM | POA: Insufficient documentation

## 2016-01-09 DIAGNOSIS — R0781 Pleurodynia: Secondary | ICD-10-CM | POA: Insufficient documentation

## 2016-01-09 DIAGNOSIS — D509 Iron deficiency anemia, unspecified: Secondary | ICD-10-CM | POA: Insufficient documentation

## 2016-01-09 LAB — CBC WITH DIFFERENTIAL/PLATELET
Basophils Absolute: 0 K/uL (ref 0.0–0.1)
Basophils Relative: 0 %
Eosinophils Absolute: 0 K/uL (ref 0.0–0.7)
Eosinophils Relative: 0 %
HCT: 36.4 % — ABNORMAL LOW (ref 39.0–52.0)
Hemoglobin: 11.9 g/dL — ABNORMAL LOW (ref 13.0–17.0)
Lymphocytes Relative: 13 %
Lymphs Abs: 0.9 K/uL (ref 0.7–4.0)
MCH: 26 pg (ref 26.0–34.0)
MCHC: 32.7 g/dL (ref 30.0–36.0)
MCV: 79.6 fL (ref 78.0–100.0)
Monocytes Absolute: 0.4 K/uL (ref 0.1–1.0)
Monocytes Relative: 5 %
Neutro Abs: 6.1 K/uL (ref 1.7–7.7)
Neutrophils Relative %: 82 %
Platelets: 198 K/uL (ref 150–400)
RBC: 4.57 MIL/uL (ref 4.22–5.81)
RDW: 15.3 % (ref 11.5–15.5)
WBC: 7.4 K/uL (ref 4.0–10.5)

## 2016-01-09 LAB — BASIC METABOLIC PANEL
ANION GAP: 7 (ref 5–15)
BUN: 15 mg/dL (ref 6–20)
CHLORIDE: 108 mmol/L (ref 101–111)
CO2: 23 mmol/L (ref 22–32)
Calcium: 8.9 mg/dL (ref 8.9–10.3)
Creatinine, Ser: 1.06 mg/dL (ref 0.61–1.24)
GFR calc non Af Amer: >60 mL/min (ref >60)
Glucose, Bld: 84 mg/dL (ref 65–99)
Potassium: 4.9 mmol/L (ref 3.5–5.1)
Sodium: 138 mmol/L (ref 135–145)

## 2016-01-09 LAB — TROPONIN I: Troponin I: <0.03 ng/mL (ref <0.03)

## 2016-01-09 MED ORDER — IOPAMIDOL (ISOVUE-370) INJECTION 76%
INTRAVENOUS | Status: AC
  Administered 2016-01-09: 100 mL
  Filled 2016-01-09: qty 100

## 2016-01-09 MED ORDER — MORPHINE SULFATE (PF) 4 MG/ML IV SOLN
4.0000 mg | Freq: Once | INTRAVENOUS | Status: AC
  Administered 2016-01-09: 4 mg via INTRAVENOUS
  Filled 2016-01-09: qty 1

## 2016-01-09 MED ORDER — ASPIRIN 81 MG PO CHEW
324.0000 mg | CHEWABLE_TABLET | Freq: Once | ORAL | Status: AC
  Administered 2016-01-09: 324 mg via ORAL
  Filled 2016-01-09: qty 4

## 2016-01-09 NOTE — ED Triage Notes (Signed)
Pt reports diffuse abdominal pain that he reports onset was after he ate pizza. [facility] and worked up for PE chart review indicating unremarkable exam with mild anemia.

## 2016-01-09 NOTE — ED Notes (Signed)
Bed: [id number] Expected date:  Expected time:  Means of arrival:  Comments: EMS 50 yo male abd pain

## 2016-01-09 NOTE — ED Notes (Signed)
Pt ambulated to restroom from room, tolerated well. 

## 2016-01-09 NOTE — ED Provider Notes (Signed)
[facility]-EMERGENCY DEPT Provider Note   CSN: [id number] Arrival date & time:        History   Chief Complaint Chief Complaint  Patient presents with  . Chest Pain    HPI Armanie Martine is a 50 y.o. male.  He has history of DVT and pulmonary embolism. About 2 hours ago, he had onset of central and right-sided chest pain. Pain is sharp and worse with deep breathing. Pain does not radiate. 50There is associated dyspnea nausea but no vomiting or diaphoresis. Pain is similar to what he had with pulmonary embolism. He also has a history of pericarditis and pain is similar to that as well. He has not done anything to treat his symptoms. He is a cigarette smoker and admits to alcohol and cocaine use including cocaine use tonight. There is no history of diabetes, hypertension, hyperlipidemia and there is no family history of premature coronary atherosclerosis.   The history is provided by the patient.    Past Medical History:  Diagnosis Date  . DVT of lower extremity (deep venous thrombosis) (HCC)   . PE (pulmonary embolism)     Patient Active Problem List   Diagnosis Date Noted  . Pulmonary emboli (HCC) 01/16/2014  . Tobacco use 01/16/2014  . Multiple pulmonary emboli (HCC) 01/16/2014  . Protein C deficiency (HCC) 12/31/2013  . Acute DVT (deep venous thrombosis) (HCC) 12/29/2013  . Pleuritic chest pain 12/29/2013  . Dyspnea 12/29/2013  . Chest pain 12/29/2013    History reviewed. No pertinent surgical history.     Home Medications    Prior to Admission medications   Medication Sig Start Date End Date Taking? Authorizing Provider  acetaminophen (TYLENOL) 325 MG tablet Take 2 tablets (650 mg total) by mouth every 6 (six) hours as needed for fever. 04/22/15   Shon Baton, MD  ondansetron (ZOFRAN) 4 MG tablet Take 1 tablet (4 mg total) by mouth every 8 (eight) hours as needed for nausea or vomiting. 04/22/15   Shon Baton, MD    Family History History reviewed. No  pertinent family history.  Social History Social History  Substance Use Topics  . Smoking status: Light Tobacco Smoker    Packs/day: 0.00    Types: Cigarettes  . Smokeless tobacco: Never Used  . Alcohol use Yes     Allergies   Pork-derived products   Review of Systems Review of Systems  All other systems reviewed and are negative.    Physical Exam Updated Vital Signs BP 140/79   Pulse (!) 59   Resp 12   Ht 5\' 7"  (1.702 m)   Wt 150 lb (68 kg)   SpO2 100%   BMI 23.49 kg/m   Physical Exam  Nursing note and vitals reviewed.  50 year old male, resting comfortably and in no acute distress. Vital signs are Normal. Oxygen saturation is 100%, which is normal. Head is normocephalic and atraumatic. PERRLA, EOMI. Oropharynx is clear. Neck is nontender and supple without adenopathy or JVD. Back is nontender and there is no CVA tenderness. Lungs are clear without rales, wheezes, or rhonchi. No rub is heard. Chest is nontender. Heart has regular rate and rhythm without murmur. Abdomen is soft, flat, nontender without masses or hepatosplenomegaly and peristalsis is normoactive. Extremities have no cyanosis or edema, full range of motion is present. Skin is warm and dry without rash. Neurologic: Mental status is normal, cranial nerves are intact, there are no motor or sensory deficits.  ED Treatments / Results  Labs (all labs ordered are listed, but only abnormal results are displayed) Labs Reviewed  CBC WITH DIFFERENTIAL/PLATELET - Abnormal; Notable for the following:       Result Value   Hemoglobin 11.9 (*)    HCT 36.4 (*)    All other components within normal limits  BASIC METABOLIC PANEL  TROPONIN I    EKG  EKG Interpretation  Date/Time:  Friday January 09 2016 05:36:40 EDT Ventricular Rate:  61 PR Interval:    QRS Duration: 87 QT Interval:  397 QTC Calculation: 400 R Axis:   -40 Text Interpretation:  Sinus rhythm Left axis deviation Borderline T wave  abnormalities ** Nonstandard lead placement, ECG interpretation not available ** When compared with ECG of 01/05/2016, No significant change was found Confirmed by [facility]  MD, Adesuwa Osgood ([id number]) on 01/09/2016 5:44:02 AM       Procedures Procedures (including critical care time)  Medications Ordered in ED Medications  morphine 4 MG/ML injection 4 mg (not administered)  aspirin chewable tablet 324 mg (not administered)     Initial Impression / Assessment and Plan / ED Course  I have reviewed the triage vital signs and the nursing notes.  Pertinent labs & imaging results that were available during my care of the patient were reviewed by me and considered in my medical decision making (see chart for details).  Clinical Course   Pleuritic chest pain in patient with history of some. Old records are reviewed confirming hospitalization for pulmonary embolism and diagnosis of protein C deficiency. He is not currently taking any anticoagulant, so he is clearly at risk for PE. CT angiogram of chest will be obtained.  Laboratory workup is significant for mild anemia which is unchanged from baseline. Patient feels much better following dose of morphine. CT angiogram is still pending. Case is signed out to Dr. Adriana Simasook.  Final Clinical Impressions(s) / ED Diagnoses   Final diagnoses:  Pleuritic chest pain  Normochromic normocytic anemia    New Prescriptions New Prescriptions   No medications on file     Dione Boozeavid Angila Wombles, MD 01/09/16 [id number]

## 2016-01-09 NOTE — Discharge Instructions (Signed)
CT scan showed no evidence of pulmonary embolism or blood clot in your lung. You will need primary care follow-up.

## 2016-01-09 NOTE — ED Triage Notes (Signed)
Pt arrived EMS with c/o cp. Per EMS pt is homeless and was knocking on people doors asking to call 911 while eating leaves off of bushes. Pt also admitting to smoking cocaine.

## 2016-01-10 ENCOUNTER — Encounter (HOSPITAL_COMMUNITY): Payer: Self-pay | Admitting: Nurse Practitioner

## 2016-01-10 ENCOUNTER — Emergency Department (HOSPITAL_COMMUNITY)
Admission: EM | Admit: 2016-01-10 | Discharge: 2016-01-12 | Disposition: A | Discharge status: Other Acute Inpatient Hospital | Payer: Federal, State, Local not specified - Other | Attending: Emergency Medicine | Admitting: Emergency Medicine

## 2016-01-10 ENCOUNTER — Encounter (HOSPITAL_COMMUNITY): Payer: Self-pay | Admitting: Emergency Medicine

## 2016-01-10 DIAGNOSIS — F333 Major depressive disorder, recurrent, severe with psychotic symptoms: Secondary | ICD-10-CM | POA: Insufficient documentation

## 2016-01-10 DIAGNOSIS — R45851 Suicidal ideations: Secondary | ICD-10-CM

## 2016-01-10 DIAGNOSIS — F17210 Nicotine dependence, cigarettes, uncomplicated: Secondary | ICD-10-CM | POA: Insufficient documentation

## 2016-01-10 DIAGNOSIS — Z79899 Other long term (current) drug therapy: Secondary | ICD-10-CM | POA: Insufficient documentation

## 2016-01-10 DIAGNOSIS — F1920 Other psychoactive substance dependence, uncomplicated: Secondary | ICD-10-CM

## 2016-01-10 DIAGNOSIS — F28 Other psychotic disorder not due to a substance or known physiological condition: Secondary | ICD-10-CM | POA: Diagnosis present

## 2016-01-10 LAB — COMPREHENSIVE METABOLIC PANEL
ALK PHOS: 46 U/L (ref 38–126)
ALT: 21 U/L (ref 17–63)
ALT: 23 U/L (ref 17–63)
AST: 24 U/L (ref 15–41)
AST: 24 U/L (ref 15–41)
Albumin: 4 g/dL (ref 3.5–5.0)
Albumin: 4.2 g/dL (ref 3.5–5.0)
Alkaline Phosphatase: 50 U/L (ref 38–126)
Anion gap: 7 (ref 5–15)
Anion gap: 8 (ref 5–15)
BILIRUBIN TOTAL: 0.6 mg/dL (ref 0.3–1.2)
BUN: 15 mg/dL (ref 6–20)
BUN: 17 mg/dL (ref 6–20)
CALCIUM: 8.7 mg/dL — AB (ref 8.9–10.3)
CO2: 25 mmol/L (ref 22–32)
CO2: 27 mmol/L (ref 22–32)
CREATININE: 1.05 mg/dL (ref 0.61–1.24)
Calcium: 9.2 mg/dL (ref 8.9–10.3)
Chloride: 105 mmol/L (ref 101–111)
Chloride: 104 mmol/L (ref 101–111)
Creatinine, Ser: 1.23 mg/dL (ref 0.61–1.24)
GFR calc Af Amer: >60 mL/min (ref >60)
GFR calc non Af Amer: >60 mL/min (ref >60)
Glucose, Bld: 93 mg/dL (ref 65–99)
Glucose, Bld: 171 mg/dL — ABNORMAL HIGH (ref 65–99)
Potassium: 3.8 mmol/L (ref 3.5–5.1)
Potassium: 4.4 mmol/L (ref 3.5–5.1)
Sodium: 137 mmol/L (ref 135–145)
Sodium: 139 mmol/L (ref 135–145)
TOTAL PROTEIN: 6.5 g/dL (ref 6.5–8.1)
Total Bilirubin: 0.7 mg/dL (ref 0.3–1.2)
Total Protein: 7.2 g/dL (ref 6.5–8.1)

## 2016-01-10 LAB — I-STAT CHEM 8, ED
BUN: 20 mg/dL (ref 6–20)
CALCIUM ION: 1 mmol/L — AB (ref 1.15–1.40)
Chloride: 106 mmol/L (ref 101–111)
Creatinine, Ser: 1.1 mg/dL (ref 0.61–1.24)
Glucose, Bld: 99 mg/dL (ref 65–99)
HEMATOCRIT: 41 % (ref 39.0–52.0)
HEMOGLOBIN: 13.9 g/dL (ref 13.0–17.0)
Potassium: 5.6 mmol/L — ABNORMAL HIGH (ref 3.5–5.1)
SODIUM: 138 mmol/L (ref 135–145)
TCO2: 24 mmol/L (ref 0–100)

## 2016-01-10 LAB — CBC WITH DIFFERENTIAL/PLATELET
BASOS PCT: 0 %
Basophils Absolute: 0 10*3/uL (ref 0.0–0.1)
EOS ABS: 0 10*3/uL (ref 0.0–0.7)
Eosinophils Relative: 0 %
HEMATOCRIT: 36.1 % — AB (ref 39.0–52.0)
HEMOGLOBIN: 11.9 g/dL — AB (ref 13.0–17.0)
Lymphocytes Relative: 10 %
Lymphs Abs: 0.7 10*3/uL (ref 0.7–4.0)
MCH: 26.5 pg (ref 26.0–34.0)
MCHC: 33 g/dL (ref 30.0–36.0)
MCV: 80.4 fL (ref 78.0–100.0)
Monocytes Absolute: 0.3 10*3/uL (ref 0.1–1.0)
Monocytes Relative: 4 %
NEUTROS ABS: 6 10*3/uL (ref 1.7–7.7)
NEUTROS PCT: 86 %
Platelets: 175 10*3/uL (ref 150–400)
RBC: 4.49 MIL/uL (ref 4.22–5.81)
RDW: 15.4 % (ref 11.5–15.5)
WBC: 7 10*3/uL (ref 4.0–10.5)

## 2016-01-10 LAB — CBC
HEMATOCRIT: 40.2 % (ref 39.0–52.0)
Hemoglobin: 13.2 g/dL (ref 13.0–17.0)
MCH: 26.6 pg (ref 26.0–34.0)
MCHC: 32.8 g/dL (ref 30.0–36.0)
MCV: 81 fL (ref 78.0–100.0)
PLATELETS: 198 10*3/uL (ref 150–400)
RBC: 4.96 MIL/uL (ref 4.22–5.81)
RDW: 15.5 % (ref 11.5–15.5)
WBC: 6.1 10*3/uL (ref 4.0–10.5)

## 2016-01-10 LAB — LIPASE, BLOOD: LIPASE: 21 U/L (ref 11–51)

## 2016-01-10 LAB — ETHANOL: Alcohol, Ethyl (B): <5 mg/dL (ref <5)

## 2016-01-10 LAB — SALICYLATE LEVEL: Salicylate Lvl: <7 mg/dL (ref 2.8–30.0)

## 2016-01-10 LAB — ACETAMINOPHEN LEVEL: Acetaminophen (Tylenol), Serum: <10 ug/mL — ABNORMAL LOW (ref 10–30)

## 2016-01-10 MED ORDER — GI COCKTAIL ~~LOC~~
30.0000 mL | Freq: Once | ORAL | Status: AC
  Administered 2016-01-10: 30 mL via ORAL
  Filled 2016-01-10: qty 30

## 2016-01-10 MED ORDER — DICYCLOMINE HCL 20 MG PO TABS: 20.0000 mg | ORAL_TABLET | Freq: Two times a day (BID) | ORAL | 0 refills | Status: DC

## 2016-01-10 MED ORDER — DICYCLOMINE HCL 10 MG/ML IM SOLN
20.0000 mg | Freq: Once | INTRAMUSCULAR | Status: DC
  Filled 2016-01-10: qty 2

## 2016-01-10 MED ORDER — OMEPRAZOLE 20 MG PO CPDR: 20.0000 mg | DELAYED_RELEASE_CAPSULE | Freq: Every day | ORAL | 0 refills | Status: DC

## 2016-01-10 NOTE — ED Notes (Addendum)
SBAR Report received from previous nurse. Pt received calm and visible on unit. Pt denies current SI/ HI, A/V H, depression, anxiety, and pain 2/10 abdominal at this time, and is otherwise stable. Pt reminded of camera surveillance, q 15 min rounds, and rules of the milieu. Pt screened for contraband by Clinical research associatewriter given tour of unit and offered snack, will continue to assess. Pt provided with beverages and specimen cup for needed sample

## 2016-01-10 NOTE — BH Assessment (Addendum)
Tele Assessment Note   Peter Washington is an 50 [facility]. [facility] command hallucinations telling him to run out in front of cars or to jump off of an overpass. Pt has been seen in the ED three times within the past 48 hours due to multiple medical issues. Pt denies SI and HI at this time. No previous suicide attempts or self-injurious behaviors reported. No psychiatric history reported. Pt shared that he has had 5 concussions in the past. Pt reported that he is dealing with multiple stressors such as breaking up with his significant other and losing his full-time employment. Pt reported that he smokes and snort cocaine, drinks alcohol on the weekend as well as smoke marijuana. No physical, sexual or emotional abuse reported.   Diagnosis: Major Depressive Disorder, Single episode, with psychosis.   Past Medical History:  Past Medical History:  Diagnosis Date  . DVT of lower extremity (deep venous thrombosis) (HCC)   . PE (pulmonary embolism)     History reviewed. No pertinent surgical history.  Family History: No family history on file.  Social History:  reports that he has been smoking Cigarettes.  He has been smoking about 0.00 packs per day. He has never used smokeless tobacco. He reports that he drinks alcohol. He reports that he does not use drugs.  Additional Social History:  Alcohol / Drug Use History of alcohol / drug use?: Yes Substance #1 Name of Substance 1: Cocaine  1 - Age of First Use: 15 1 - Amount (size/oz): "1 or 2 lines" 1 - Frequency: "every few months"  1 - Duration: ongoing  1 - Last Use / Amount: 01-08-16 Substance #2 Name of Substance 2: THC  2 - Age of First Use: 13 2 - Frequency: monthly  2 - Duration: ongoing  2 - Last Use / Amount: "1 week ago"  Substance #3 Name of Substance 3: Alcohol 3 - Age of First Use:  13 3 - Amount (size/oz): 40oz 3 - Frequency: weeknends 3 - Duration: ongoing  3 - Last Use / Amount: "3 days ago"   CIWA:  CIWA-Ar BP: 120/64 Pulse Rate: 64 COWS:    PATIENT STRENGTHS: (choose at least two) Average or above average intelligence Motivation for treatment/growth  Allergies:  Allergies  Allergen Reactions  . Pork-Derived Products Nausea And Vomiting    Home Medications:  (Not in a hospital admission)  OB/GYN Status:  No LMP for male patient.  General Assessment Data Location of Assessment: [facility] ED TTS Assessment: In system Is this a Tele or Face-to-Face Assessment?: Face-to-Face Is this an Initial Assessment or a Re-assessment for this encounter?: Initial Assessment Marital status: Single Living Arrangements: Alone Can pt return to current living arrangement?: Yes Admission Status: Voluntary Is patient capable of signing voluntary admission?: Yes Referral Source: Self/Family/Friend Insurance type: None     Crisis Care Plan Living Arrangements: Alone Name of Psychiatrist: No provider reported  Name of Therapist: No provider reported  Education Status Is patient currently in school?: No Highest grade of school patient has completed: 12  Risk to self with the past 6 months Suicidal Ideation: No Has patient been a risk to self within the past 6 months prior to admission? : No Suicidal Intent: No Has patient had any suicidal intent within the past 6 months prior to admission? : No Is patient at risk for suicide?: No Suicidal Plan?: No Has patient had any suicidal plan within the past 6 months prior to admission? : No  Access to Means: No What has been your use of drugs/alcohol within the last 12 months?: Cocaine, alcohol and marijuana use reported  Previous Attempts/Gestures: No How many times?: 0 Other Self Harm Risks: Denies Triggers for Past Attempts: None known (No previous attempts reported ) Intentional Self Injurious Behavior: None Family Suicide History: Yes Civil engineer, contracting(Uncle ) Recent stressful life event(s): Job Loss, Other (Comment) (Broke up with significant other.  ) Persecutory voices/beliefs?: No Depression: Yes Depression Symptoms: Despondent, Tearfulness, Insomnia, Guilt, Loss of interest in usual pleasures, Feeling angry/irritable, Feeling worthless/self pity, Isolating, Fatigue Substance abuse history and/or treatment for substance abuse?: Yes Suicide prevention information given to non-admitted patients: Not applicable  Risk to Others within the past 6 months Homicidal Ideation: No Does patient have any lifetime risk of violence toward others beyond the six months prior to admission? : No Thoughts of Harm to Others: No Current Homicidal Intent: No Current Homicidal Plan: No Access to Homicidal Means: No Identified Victim: N/A History of harm to others?: No Assessment of Violence: None Noted Violent Behavior Description: No violent behaviors observed. Pt is calm and cooperative at this time.  Does patient have access to weapons?: No Criminal Charges Pending?: No Does patient have a court date: No Is patient on probation?: No  Psychosis Hallucinations: Auditory, With command Delusions: None noted  Mental Status Report Appearance/Hygiene: In scrubs Eye Contact: Fair Motor Activity: Freedom of movement Speech: Logical/coherent Level of Consciousness: Quiet/awake Mood: Depressed Affect: Appropriate to circumstance Anxiety Level: Minimal Thought Processes: Coherent, Relevant Judgement: Impaired Orientation: Person, Place, Time, Situation Obsessive Compulsive Thoughts/Behaviors: None  Cognitive Functioning Concentration: Decreased Memory: Recent Intact, Remote Intact IQ: Average Insight: Fair Impulse Control: Fair Appetite: Good Weight Loss: 0 Weight Gain: 0 Sleep: No Change Total Hours of Sleep: 7 Vegetative Symptoms: None  ADLScreening [facility]) Patient's cognitive ability adequate to safely complete daily activities?: Yes Patient able to express need for assistance with ADLs?: Yes Independently  performs ADLs?: Yes (appropriate for developmental age)  Prior Inpatient Therapy Prior Inpatient Therapy: No  Prior Outpatient Therapy Prior Outpatient Therapy: No Does patient have an ACCT team?: No Does patient have Intensive In-House Services?  : No Does patient have [facility] services? : No Does patient have P4CC services?: No  ADL Screening (condition at time of admission) Patient's cognitive ability adequate to safely complete daily activities?: Yes Is the patient deaf or have difficulty hearing?: No Does the patient have difficulty seeing, even when wearing glasses/contacts?: No Does the patient have difficulty concentrating, remembering, or making decisions?: No Patient able to express need for assistance with ADLs?: Yes Does the patient have difficulty dressing or bathing?: No Independently performs ADLs?: Yes (appropriate for developmental age) Does the patient have difficulty walking or climbing stairs?: No       Abuse/Neglect Assessment (Assessment to be complete while patient is alone) Physical Abuse: Denies Verbal Abuse: Denies Sexual Abuse: Denies Exploitation of patient/patient's resources: Denies Self-Neglect: Denies     Merchant navy officerAdvance Directives (For Healthcare) Does patient have an advance directive?: No Would patient like information on creating an advanced directive?: No - patient declined information    Additional Information 1:1 In Past 12 Months?: No CIRT Risk: No Elopement Risk: No Does patient have medical clearance?: Yes     Disposition:  Disposition Initial Assessment Completed for this Encounter: Yes  Jonalyn Sedlak S 01/10/2016 9:12 PM

## 2016-01-10 NOTE — ED Notes (Signed)
[id number] Expected date: 01/10/16 Expected time:  Means of arrival:  Comments: Hold for t3

## 2016-01-10 NOTE — ED Provider Notes (Addendum)
[facility]-EMERGENCY DEPT Provider Note   CSN: [id number] Arrival date & time: 01/09/16  2355  By signing my name below, I, Peter Washington, attest that this documentation has been prepared under the direction and in the presence of Peter Lamia, MD . Electronically Signed: Nelwyn Washington, Scribe. 01/10/2016. 12:53 AM.  History   Chief Complaint Chief Complaint  Patient presents with  . Abdominal Pain   The history is provided by the patient. No language interpreter was used.  Abdominal Pain   This is a new problem. The current episode started 1 to 2 hours ago. The problem occurs constantly. The problem has not changed since onset.The pain is located in the LUQ. Quality: Stabbing. Associated symptoms include nausea and vomiting. Pertinent negatives include constipation. Exacerbated by: eating pizza. Nothing relieves the symptoms. Past workup does not include GI consult. His past medical history does not include PUD.    HPI Comments:  Peter Washington is a 50 y.o. male with pmhx of DVT and PE who presents to the Emergency Department complaining of sudden-onset constant unchanged abdominal pain beginning about 2 hours ago. He describes his pain as stabbing pain in his LUQ. Pt was seen yesterday for chest pain but reports that his abdominal pain he is currently experiencing is not related. He reports associated vomiting (x3). No other symptoms reported.  Seen yesterday and ruled out for MI and PE.  Then ate pizza tonight and symptoms started.    Past Medical History:  Diagnosis Date  . DVT of lower extremity (deep venous thrombosis) (HCC)   . PE (pulmonary embolism)     Patient Active Problem List   Diagnosis Date Noted  . Pulmonary emboli (HCC) 01/16/2014  . Tobacco use 01/16/2014  . Multiple pulmonary emboli (HCC) 01/16/2014  . Protein C deficiency (HCC) 12/31/2013  . Acute DVT (deep venous thrombosis) (HCC) 12/29/2013  . Pleuritic chest pain 12/29/2013  . Dyspnea 12/29/2013  . Chest pain  12/29/2013    History reviewed. No pertinent surgical history.     Home Medications    Prior to Admission medications   Medication Sig Start Date End Date Taking? Authorizing Provider  acetaminophen (TYLENOL) 325 MG tablet Take 2 tablets (650 mg total) by mouth every 6 (six) hours as needed for fever. Patient not taking: Reported on 01/09/2016 04/22/15   Shon Baton, MD  ondansetron (ZOFRAN) 4 MG tablet Take 1 tablet (4 mg total) by mouth every 8 (eight) hours as needed for nausea or vomiting. Patient not taking: Reported on 01/09/2016 04/22/15   Shon Baton, MD    Family History History reviewed. No pertinent family history.  Social History Social History  Substance Use Topics  . Smoking status: Light Tobacco Smoker    Packs/day: 0.00    Types: Cigarettes  . Smokeless tobacco: Never Used  . Alcohol use Yes     Allergies   Pork-derived products   Review of Systems Review of Systems  Gastrointestinal: Positive for abdominal pain, nausea and vomiting. Negative for constipation.  Genitourinary: Negative for flank pain.  All other systems reviewed and are negative.    Physical Exam Updated Vital Signs BP 108/68 (BP Location: Left Arm)   Pulse 61   Temp 97.9 F (36.6 C) (Oral)   Resp 12   Ht 5\' 7"  (1.702 m)   Wt 150 lb (68 kg)   SpO2 100%   BMI 23.49 kg/m   Physical Exam  Constitutional: He is oriented to person, place, and time. He appears well-developed  and well-nourished. No distress.  Sound asleep resting comfortable and easily roused.  HENT:  Head: Normocephalic and atraumatic.  Mouth/Throat: Oropharynx is clear and moist. No oropharyngeal exudate.  Moist mucous membranes   Eyes: Pupils are equal, round, and reactive to light.  Neck: Normal range of motion. Neck supple.  Trachea midline No bruit  Cardiovascular: Normal rate, regular rhythm, normal heart sounds and intact distal pulses.   Pulmonary/Chest: Effort normal and breath sounds  normal. No stridor.  Abdominal: Soft. He exhibits no mass. There is no tenderness. There is no rebound and no guarding. No hernia.  Hyperactive bowel sounds. No Murphy's sign. No McBurney's sign. No rigidity.  Musculoskeletal: Normal range of motion.  Neurological: He is alert and oriented to person, place, and time. He has normal reflexes.  Skin: Skin is warm and dry.  Psychiatric: He has a normal mood and affect. His behavior is normal.  Nursing note and vitals reviewed.  ED Treatments / Results   Vitals:   01/10/16 0130 01/10/16 0200  BP: 104/57 103/56  Pulse: 62 61  Resp: 12 13  Temp:     Results for orders placed or performed during the hospital encounter of 01/09/16  Lipase, blood  Result Value Ref Range   Lipase 21 11 - 51 U/L  Comprehensive metabolic panel  Result Value Ref Range   Sodium 137 135 - 145 mmol/L   Potassium 3.8 3.5 - 5.1 mmol/L   Chloride 105 101 - 111 mmol/L   CO2 25 22 - 32 mmol/L   Glucose, Bld 93 65 - 99 mg/dL   BUN 15 6 - 20 mg/dL   Creatinine, Ser 1.611.05 0.61 - 1.24 mg/dL   Calcium 8.7 (L) 8.9 - 10.3 mg/dL   Total Protein 6.5 6.5 - 8.1 g/dL   Albumin 4.0 3.5 - 5.0 g/dL   AST 24 15 - 41 U/L   ALT 21 17 - 63 U/L   Alkaline Phosphatase 46 38 - 126 U/L   Total Bilirubin 0.6 0.3 - 1.2 mg/dL   GFR calc non Af Amer >60 >60 mL/min   GFR calc Af Amer >60 >60 mL/min   Anion gap 7 5 - 15  CBC with Differential  Result Value Ref Range   WBC 7.0 4.0 - 10.5 K/uL   RBC 4.49 4.22 - 5.81 MIL/uL   Hemoglobin 11.9 (L) 13.0 - 17.0 g/dL   HCT 09.636.1 (L) 04.539.0 - 40.952.0 %   MCV 80.4 78.0 - 100.0 fL   MCH 26.5 26.0 - 34.0 pg   MCHC 33.0 30.0 - 36.0 g/dL   RDW 81.115.4 91.411.5 - 78.215.5 %   Platelets 175 150 - 400 K/uL   Neutrophils Relative % 86 %   Neutro Abs 6.0 1.7 - 7.7 K/uL   Lymphocytes Relative 10 %   Lymphs Abs 0.7 0.7 - 4.0 K/uL   Monocytes Relative 4 %   Monocytes Absolute 0.3 0.1 - 1.0 K/uL   Eosinophils Relative 0 %   Eosinophils Absolute 0.0 0.0 - 0.7 K/uL     Basophils Relative 0 %   Basophils Absolute 0.0 0.0 - 0.1 K/uL  I-Stat Chem 8, ED  Result Value Ref Range   Sodium 138 135 - 145 mmol/L   Potassium 5.6 (H) 3.5 - 5.1 mmol/L   Chloride 106 101 - 111 mmol/L   BUN 20 6 - 20 mg/dL   Creatinine, Ser 9.561.10 0.61 - 1.24 mg/dL   Glucose, Bld 99 65 - 99 mg/dL  Calcium, Ion 1.00 (L) 1.15 - 1.40 mmol/L   TCO2 24 0 - 100 mmol/L   Hemoglobin 13.9 13.0 - 17.0 g/dL   HCT 56.2 13.0 - 86.5 %   Ct Angio Chest Pe W And/or Wo Contrast  Result Date: 01/09/2016 CLINICAL DATA:  Mid chest pain, right side chest pain with tip breathing starting this morning EXAM: CT ANGIOGRAPHY CHEST WITH CONTRAST TECHNIQUE: Multidetector CT imaging of the chest was performed using the standard protocol during bolus administration of intravenous contrast. Multiplanar CT image reconstructions and MIPs were obtained to evaluate the vascular anatomy. CONTRAST:  100 cc Isovue COMPARISON:  CT chest 12/25/2015 FINDINGS: Cardiovascular: Heart size within normal limits. No pericardial effusion. The study is of excellent technical quality. No pulmonary embolus is noted. No aortic aneurysm or aortic dissection. Mediastinum/Nodes: No mediastinal hematoma or adenopathy. Visualized esophagus is unremarkable. No hilar adenopathy. Lungs/Pleura: No infiltrate or pleural effusion. No pulmonary edema. Mild atelectasis noted bilateral lower lobe posteriorly. Mild atelectasis posterior to the left major fissure. No pneumothorax. No bronchiectasis. A single emphysematous bulla is noted in lingula. Upper Abdomen: The visualized upper abdomen is unremarkable. Musculoskeletal: No destructive bony lesions are noted. Sagittal images of the spine shows degenerative changes thoracic spine. Sagittal view of the sternum is unremarkable. Review of the MIP images confirms the above findings. IMPRESSION: 1. No pulmonary embolus.  No aortic aneurysm or dissection. 2. No mediastinal hematoma or adenopathy. 3. Mild  atelectasis bilateral lower lobe posteriorly. 4. Degenerative changes thoracolumbar spine. Electronically Signed   By: Natasha Mead M.D.   On: 01/09/2016 09:01    Radiology Ct Angio Chest Pe W And/or Wo Contrast  Result Date: 01/09/2016 CLINICAL DATA:  Mid chest pain, right side chest pain with tip breathing starting this morning EXAM: CT ANGIOGRAPHY CHEST WITH CONTRAST TECHNIQUE: Multidetector CT imaging of the chest was performed using the standard protocol during bolus administration of intravenous contrast. Multiplanar CT image reconstructions and MIPs were obtained to evaluate the vascular anatomy. CONTRAST:  100 cc Isovue COMPARISON:  CT chest 12/25/2015 FINDINGS: Cardiovascular: Heart size within normal limits. No pericardial effusion. The study is of excellent technical quality. No pulmonary embolus is noted. No aortic aneurysm or aortic dissection. Mediastinum/Nodes: No mediastinal hematoma or adenopathy. Visualized esophagus is unremarkable. No hilar adenopathy. Lungs/Pleura: No infiltrate or pleural effusion. No pulmonary edema. Mild atelectasis noted bilateral lower lobe posteriorly. Mild atelectasis posterior to the left major fissure. No pneumothorax. No bronchiectasis. A single emphysematous bulla is noted in lingula. Upper Abdomen: The visualized upper abdomen is unremarkable. Musculoskeletal: No destructive bony lesions are noted. Sagittal images of the spine shows degenerative changes thoracic spine. Sagittal view of the sternum is unremarkable. Review of the MIP images confirms the above findings. IMPRESSION: 1. No pulmonary embolus.  No aortic aneurysm or dissection. 2. No mediastinal hematoma or adenopathy. 3. Mild atelectasis bilateral lower lobe posteriorly. 4. Degenerative changes thoracolumbar spine. Electronically Signed   By: Natasha Mead M.D.   On: 01/09/2016 09:01    Procedures Procedures (including critical care time)  Medications Ordered in ED Medications  dicyclomine  (BENTYL) injection 20 mg (20 mg Intramuscular Refused 01/10/16 0215)  gi cocktail (Maalox,Lidocaine,Donnatal) (30 mLs Oral Given 01/10/16 7846)     Final Clinical Impressions(s) / ED Diagnoses   All questions answered to patient's parents satisfaction. Based on history and exam patient has been appropriately medically screened and emergency conditions excluded. Patient is stable for discharge at this time. Follow up with your PMD for recheck  in 2 days and strict return precautions given.   I personally performed the services described in this documentation, which was scribed in my presence. The recorded information has been reviewed and is accurate.       Cy BlamerApril Amire Leazer, MD 01/10/16 [id number]    Corina Stacy, MD 01/10/16 [phone]

## 2016-01-10 NOTE — ED Triage Notes (Signed)
Pt reports multiple voices in his head telling him to kill himself 'by walking out in front of traffic'.  Reports only passive SI but that voices constantly tell him to kill himself by walking into traffic or that he is worthless.  Reports that he sometimes 'sees thing out of the corner of my eye'.  Denies any HI.  Pt reports that he came here 'to keep myself to getting any worse'.  Denies any trouble sleeping recently.  Reports that he lost a close friend, his job, and broke up with his significant other last week.  Pt denies taking any medications or seeing a therapist.  Reports occasional drinking 'trying to bring my mood up'.  Does not answer question about taking drugs directly.

## 2016-01-11 DIAGNOSIS — Z9102 Food additives allergy status: Secondary | ICD-10-CM

## 2016-01-11 DIAGNOSIS — F1920 Other psychoactive substance dependence, uncomplicated: Secondary | ICD-10-CM | POA: Diagnosis present

## 2016-01-11 DIAGNOSIS — F333 Major depressive disorder, recurrent, severe with psychotic symptoms: Secondary | ICD-10-CM | POA: Diagnosis not present

## 2016-01-11 DIAGNOSIS — R45851 Suicidal ideations: Secondary | ICD-10-CM

## 2016-01-11 DIAGNOSIS — Z79899 Other long term (current) drug therapy: Secondary | ICD-10-CM

## 2016-01-11 DIAGNOSIS — F17210 Nicotine dependence, cigarettes, uncomplicated: Secondary | ICD-10-CM | POA: Diagnosis not present

## 2016-01-11 DIAGNOSIS — F28 Other psychotic disorder not due to a substance or known physiological condition: Secondary | ICD-10-CM | POA: Diagnosis present

## 2016-01-11 LAB — RAPID URINE DRUG SCREEN, HOSP PERFORMED
Amphetamines: NOT DETECTED
BARBITURATES: POSITIVE — AB
Benzodiazepines: NOT DETECTED
Cocaine: POSITIVE — AB
Opiates: POSITIVE — AB
TETRAHYDROCANNABINOL: POSITIVE — AB

## 2016-01-11 MED ORDER — RISPERIDONE 0.5 MG PO TABS
0.5000 mg | ORAL_TABLET | Freq: Every day | ORAL | Status: DC
  Administered 2016-01-11: 0.5 mg via ORAL
  Filled 2016-01-11: qty 1

## 2016-01-11 MED ORDER — CLONIDINE HCL 0.1 MG PO TABS: 0.1000 mg | ORAL_TABLET | Freq: Every day | ORAL | Status: DC

## 2016-01-11 MED ORDER — ACETAMINOPHEN 325 MG PO TABS
650.0000 mg | ORAL_TABLET | Freq: Four times a day (QID) | ORAL | Status: DC | PRN
  Administered 2016-01-11: 650 mg via ORAL
  Filled 2016-01-11: qty 2

## 2016-01-11 MED ORDER — CLONIDINE HCL 0.1 MG PO TABS: 0.1000 mg | ORAL_TABLET | ORAL | Status: DC

## 2016-01-11 MED ORDER — NAPROXEN 500 MG PO TABS: 500.0000 mg | ORAL_TABLET | Freq: Two times a day (BID) | ORAL | Status: DC | PRN

## 2016-01-11 MED ORDER — DICYCLOMINE HCL 20 MG PO TABS: 20.0000 mg | ORAL_TABLET | Freq: Four times a day (QID) | ORAL | Status: DC | PRN

## 2016-01-11 MED ORDER — SERTRALINE HCL 50 MG PO TABS
50.0000 mg | ORAL_TABLET | Freq: Every day | ORAL | Status: DC
  Administered 2016-01-11 – 2016-01-12 (×2): 50 mg via ORAL
  Filled 2016-01-11 (×2): qty 1

## 2016-01-11 MED ORDER — PANTOPRAZOLE SODIUM 40 MG PO TBEC
40.0000 mg | DELAYED_RELEASE_TABLET | Freq: Every day | ORAL | Status: DC
  Administered 2016-01-11: 40 mg via ORAL
  Filled 2016-01-11 (×2): qty 1

## 2016-01-11 MED ORDER — METHOCARBAMOL 500 MG PO TABS
500.0000 mg | ORAL_TABLET | Freq: Three times a day (TID) | ORAL | Status: DC | PRN
  Filled 2016-01-11: qty 1

## 2016-01-11 MED ORDER — HYDROXYZINE HCL 25 MG PO TABS
25.0000 mg | ORAL_TABLET | Freq: Four times a day (QID) | ORAL | Status: DC | PRN
  Administered 2016-01-11: 25 mg via ORAL
  Filled 2016-01-11: qty 1

## 2016-01-11 MED ORDER — ONDANSETRON 4 MG PO TBDP: 4.0000 mg | ORAL_TABLET | Freq: Four times a day (QID) | ORAL | Status: DC | PRN

## 2016-01-11 MED ORDER — CLONIDINE HCL 0.1 MG PO TABS
0.1000 mg | ORAL_TABLET | Freq: Four times a day (QID) | ORAL | Status: DC
  Administered 2016-01-11 (×2): 0.1 mg via ORAL
  Filled 2016-01-11 (×3): qty 1

## 2016-01-11 MED ORDER — LOPERAMIDE HCL 2 MG PO CAPS: 2.0000 mg | ORAL_CAPSULE | ORAL | Status: DC | PRN

## 2016-01-11 NOTE — ED Notes (Signed)
Pt compliant with medication regimen. Withdrawn, Sad affect. Pt reports increase in depression. Endorsing auditory hallucinations.Special checks q 15 mins in place for safety. Video monitoring in place.

## 2016-01-11 NOTE — ED Provider Notes (Addendum)
[facility]-EMERGENCY DEPT Provider Note   CSN: [id number] Arrival date & time: 01/10/16  1631     History   Chief Complaint Chief Complaint  Patient presents with  . Suicidal    HPI Peter Washington is a 50 y.o. male.Chief complaint is the voices are telling me to kill myself  Patient states that he has had "a bad week". States that last week he broke up with his significant other and lost his job. He states he chronically has "hallucinations" where he was here voices occasionally still like he sees movements out of the corner of his eye. However and her time distress assistance he worse. He states last few days they've been telling him to step out into traffic and kill himself. He has never actually attempted to end his life or commit suicide but is concerned he will follow through on at this time and thus presents here for evaluation.  Does not know any formal health diagnosis in the past. He states he used to take Depakote but does not know if he has been diagnosed as bipolar.  HPI  Past Medical History:  Diagnosis Date  . DVT of lower extremity (deep venous thrombosis) (HCC)   . PE (pulmonary embolism)     Patient Active Problem List   Diagnosis Date Noted  . Pulmonary emboli (HCC) 01/16/2014  . Tobacco use 01/16/2014  . Multiple pulmonary emboli (HCC) 01/16/2014  . Protein C deficiency (HCC) 12/31/2013  . Acute DVT (deep venous thrombosis) (HCC) 12/29/2013  . Pleuritic chest pain 12/29/2013  . Dyspnea 12/29/2013  . Chest pain 12/29/2013    History reviewed. No pertinent surgical history.     Home Medications    Prior to Admission medications   Medication Sig Start Date End Date Taking? Authorizing Provider  acetaminophen (TYLENOL) 325 MG tablet Take 2 tablets (650 mg total) by mouth every 6 (six) hours as needed for fever. Patient not taking: Reported on 01/10/2016 04/22/15   Shon Batonourtney F Horton, MD  dicyclomine (BENTYL) 20 MG tablet Take 1 tablet (20 mg total) by mouth 2  (two) times daily. 01/10/16   April Palumbo, MD  omeprazole (PRILOSEC) 20 MG capsule Take 1 capsule (20 mg total) by mouth daily. 01/10/16   April Palumbo, MD  ondansetron (ZOFRAN) 4 MG tablet Take 1 tablet (4 mg total) by mouth every 8 (eight) hours as needed for nausea or vomiting. Patient not taking: Reported on 01/10/2016 04/22/15   Shon Batonourtney F Horton, MD    Family History No family history on file.  Social History Social History  Substance Use Topics  . Smoking status: Light Tobacco Smoker    Packs/day: 0.00    Types: Cigarettes  . Smokeless tobacco: Never Used  . Alcohol use Yes     Allergies   Pork-derived products   Review of Systems Review of Systems  Constitutional: Negative for appetite change, chills, diaphoresis, fatigue and fever.  HENT: Negative for mouth sores, sore throat and trouble swallowing.   Eyes: Negative for visual disturbance.  Respiratory: Negative for cough, chest tightness, shortness of breath and wheezing.   Cardiovascular: Negative for chest pain.  Gastrointestinal: Negative for abdominal distention, abdominal pain, diarrhea, nausea and vomiting.  Endocrine: Negative for polydipsia, polyphagia and polyuria.  Genitourinary: Negative for dysuria, frequency and hematuria.  Musculoskeletal: Negative for gait problem.  Skin: Negative for color change, pallor and rash.  Neurological: Negative for dizziness, syncope, light-headedness and headaches.  Hematological: Does not bruise/bleed easily.  Psychiatric/Behavioral: Positive for dysphoric mood,  hallucinations and suicidal ideas. Negative for behavioral problems and confusion.     Physical Exam Updated Vital Signs BP 107/78 (BP Location: Left Arm)   Pulse 84   Temp 98 F (36.7 C) (Oral)   Resp 18   Ht 5\' 7"  (1.702 m)   Wt 150 lb (68 kg)   SpO2 99%   BMI 23.49 kg/m   Physical Exam  Constitutional: He is oriented to person, place, and time. He appears well-developed and well-nourished. No  distress.  HENT:  Head: Normocephalic.  Eyes: Conjunctivae are normal. Pupils are equal, round, and reactive to light. No scleral icterus.  Neck: Normal range of motion. Neck supple. No thyromegaly present.  Cardiovascular: Normal rate and regular rhythm.  Exam reveals no gallop and no friction rub.   No murmur heard. Pulmonary/Chest: Effort normal and breath sounds normal. No respiratory distress. He has no wheezes. He has no rales.  Abdominal: Soft. Bowel sounds are normal. He exhibits no distension. There is no tenderness. There is no rebound.  Musculoskeletal: Normal range of motion.  Neurological: He is alert and oriented to person, place, and time.  Skin: Skin is warm and dry. No rash noted.  Psychiatric: He has a normal mood and affect. His behavior is normal.  : Alert and oriented. Does not exactly hallucinating. He is able to follow conversation.     ED Treatments / Results  Labs (all labs ordered are listed, but only abnormal results are displayed) Labs Reviewed  COMPREHENSIVE METABOLIC PANEL - Abnormal; Notable for the following:       Result Value   Glucose, Bld 171 (*)    All other components within normal limits  ACETAMINOPHEN LEVEL - Abnormal; Notable for the following:    Acetaminophen (Tylenol), Serum <10 (*)    All other components within normal limits  ETHANOL  SALICYLATE LEVEL  CBC  RAPID URINE DRUG SCREEN, HOSP PERFORMED    EKG  EKG Interpretation None       Radiology Ct Angio Chest Pe W And/or Wo Contrast  Result Date: 01/09/2016 CLINICAL DATA:  Mid chest pain, right side chest pain with tip breathing starting this morning EXAM: CT ANGIOGRAPHY CHEST WITH CONTRAST TECHNIQUE: Multidetector CT imaging of the chest was performed using the standard protocol during bolus administration of intravenous contrast. Multiplanar CT image reconstructions and MIPs were obtained to evaluate the vascular anatomy. CONTRAST:  100 cc Isovue COMPARISON:  CT chest  12/25/2015 FINDINGS: Cardiovascular: Heart size within normal limits. No pericardial effusion. The study is of excellent technical quality. No pulmonary embolus is noted. No aortic aneurysm or aortic dissection. Mediastinum/Nodes: No mediastinal hematoma or adenopathy. Visualized esophagus is unremarkable. No hilar adenopathy. Lungs/Pleura: No infiltrate or pleural effusion. No pulmonary edema. Mild atelectasis noted bilateral lower lobe posteriorly. Mild atelectasis posterior to the left major fissure. No pneumothorax. No bronchiectasis. A single emphysematous bulla is noted in lingula. Upper Abdomen: The visualized upper abdomen is unremarkable. Musculoskeletal: No destructive bony lesions are noted. Sagittal images of the spine shows degenerative changes thoracic spine. Sagittal view of the sternum is unremarkable. Review of the MIP images confirms the above findings. IMPRESSION: 1. No pulmonary embolus.  No aortic aneurysm or dissection. 2. No mediastinal hematoma or adenopathy. 3. Mild atelectasis bilateral lower lobe posteriorly. 4. Degenerative changes thoracolumbar spine. Electronically Signed   By: Natasha MeadLiviu  Pop M.D.   On: 01/09/2016 09:01    Procedures Procedures (including critical care time)  Medications Ordered in ED Medications - No data to  display   Initial Impression / Assessment and Plan / ED Course  I have reviewed the triage vital signs and the nursing notes.  Pertinent labs & imaging results that were available during my care of the patient were reviewed by me and considered in my medical decision making (see chart for details).  Clinical Course    Normal screening evaluation. Planning inpatient treatment.  Final Clinical Impressions(s) / ED Diagnoses   Final diagnoses:  Suicidal ideation    New Prescriptions New Prescriptions   No medications on file     Rolland Porter, MD 01/11/16 0004    Rolland Porter, MD 01/11/16 0006

## 2016-01-11 NOTE — ED Notes (Addendum)
SBAR Report received from previous nurse. Pt received calm and visible on unit. Pt denies current SI/ HI, A/V H, depression, anxiety, and pain at this time, and is otherwise stable. Pt reminded of camera surveillance, q 15 min rounds, and rules of the milieu, will continue to assess.

## 2016-01-11 NOTE — Consult Note (Signed)
[facility] Face-to-Face Psychiatry Consult   Reason for Consult:  Depression, suicidal, psychosis Referring Physician:  EDP Patient Identification: Peter Washington MRN:  [id number] Principal Diagnosis: Major depressive disorder, recurrent episode, severe, with psychotic behavior ([place]) Diagnosis:   Patient Active Problem List   Diagnosis Date Noted  . Polysubstance (including opioids) dependence with physiol dependence ([place]) [F19.20] 01/11/2016    Priority: High  . Major depressive disorder, recurrent episode, severe, with psychotic behavior ([place]) [F33.3] 01/11/2016    Priority: High  . Pulmonary emboli ([place]) [I26.99] 01/16/2014  . Tobacco use [Z72.0] 01/16/2014  . Multiple pulmonary emboli ([place]) [I26.99] 01/16/2014  . Protein C deficiency ([place]) [D68.59] 12/31/2013  . Acute DVT (deep venous thrombosis) ([place]) [I82.409] 12/29/2013  . Pleuritic chest pain [R07.81] 12/29/2013  . Dyspnea [R06.00] 12/29/2013  . Chest pain [R07.9] 12/29/2013    Total Time spent with patient: 45 minutes  Subjective:   Peter Washington is a 50 y.o. male patient admitted with suicidal thoughts with plan.  HPI:  Peter Washington is an 50 y.o. male who reports history of Depression and substance abuse. He came to [facility] for evaluation and treatment of worsening depression. Patient reports that he has been hearing voices telling to hurt himself by running out in front of cars or to jump off of an overpass. Patient reports feeling hopeless, helpless, worthless and anhedonic. He is not currently on any medications but has been self medicating with Cocaine, THC, Opiates and Barbiturates.Pt reported that he is dealing with multiple stressors such as breaking up with his significant other and recently lost his job. He is unable to contract for safety.   Past Psychiatric History:as above  Risk to Self: Suicidal Ideation:Yes Suicidal Intent: Yes Is patient at risk for suicide?: No Suicidal Plan?: Yes Access to Means: No What has been your  use of drugs/alcohol within the last 12 months?: Cocaine, alcohol and marijuana use reported  How many times?: 0 Other Self Harm Risks: Denies Triggers for Past Attempts: None known (No previous attempts reported ) Intentional Self Injurious Behavior: None Risk to Others: Homicidal Ideation: No Thoughts of Harm to Others: No Current Homicidal Intent: No Current Homicidal Plan: No Access to Homicidal Means: No Identified Victim: N/A History of harm to others?: No Assessment of Violence: None Noted Violent Behavior Description: No violent behaviors observed. Pt is calm and cooperative at this time.  Does patient have access to weapons?: No Criminal Charges Pending?: No Does patient have a court date: No Prior Inpatient Therapy: Prior Inpatient Therapy: No Prior Outpatient Therapy: Prior Outpatient Therapy: No Does patient have an ACCT team?: No Does patient have Intensive In-House Services?  : No Does patient have [facility] services? : No Does patient have [facility] services?: No  Past Medical History:  Past Medical History:  Diagnosis Date  . DVT of lower extremity (deep venous thrombosis) ([place])   . PE (pulmonary embolism)    History reviewed. No pertinent surgical history. Family History: No family history on file. Family Psychiatric  History:  Social History:  History  Alcohol Use  . Yes     History  Drug Use No    Social History   Social History  . Marital status: Single    Spouse name: N/A  . Number of children: N/A  . Years of education: N/A   Social History Main Topics  . Smoking status: Light Tobacco Smoker    Packs/day: 0.00    Types: Cigarettes  . Smokeless tobacco: Never Used  . Alcohol use  Yes  . Drug use: No  . Sexual activity: Not Asked   Other Topics Concern  . None   Social History Narrative  . None   Additional Social History:    Allergies:   Allergies  Allergen Reactions  . Pork-Derived Products Nausea And Vomiting    Labs:  Results  for orders placed or performed during the hospital encounter of 01/10/16 (from the past 48 hour(s))  Rapid urine drug screen (hospital performed)     Status: Abnormal   Collection Time: 01/10/16 10:38 AM  Result Value Ref Range   Opiates POSITIVE (A) NONE DETECTED   Cocaine POSITIVE (A) NONE DETECTED   Benzodiazepines NONE DETECTED NONE DETECTED   Amphetamines NONE DETECTED NONE DETECTED   Tetrahydrocannabinol POSITIVE (A) NONE DETECTED   Barbiturates POSITIVE (A) NONE DETECTED    Comment:        DRUG SCREEN FOR MEDICAL PURPOSES ONLY.  IF CONFIRMATION IS NEEDED FOR ANY PURPOSE, NOTIFY LAB WITHIN 5 DAYS.        LOWEST DETECTABLE LIMITS FOR URINE DRUG SCREEN Drug Class       Cutoff (ng/mL) Amphetamine      1000 Barbiturate      200 Benzodiazepine   846 Tricyclics       962 Opiates          300 Cocaine          300 THC              50   Comprehensive metabolic panel     Status: Abnormal   Collection Time: 01/10/16  5:45 PM  Result Value Ref Range   Sodium 139 135 - 145 mmol/L   Potassium 4.4 3.5 - 5.1 mmol/L   Chloride 104 101 - 111 mmol/L   CO2 27 22 - 32 mmol/L   Glucose, Bld 171 (H) 65 - 99 mg/dL   BUN 17 6 - 20 mg/dL   Creatinine, Ser 1.23 0.61 - 1.24 mg/dL   Calcium 9.2 8.9 - 10.3 mg/dL   Total Protein 7.2 6.5 - 8.1 g/dL   Albumin 4.2 3.5 - 5.0 g/dL   AST 24 15 - 41 U/L   ALT 23 17 - 63 U/L   Alkaline Phosphatase 50 38 - 126 U/L   Total Bilirubin 0.7 0.3 - 1.2 mg/dL   GFR calc non Af Amer >60 >60 mL/min   GFR calc Af Amer >60 >60 mL/min    Comment: (NOTE) The eGFR has been calculated using the CKD EPI equation. This calculation has not been validated in all clinical situations. eGFR's persistently <60 mL/min signify possible Chronic Kidney Disease.    Anion gap 8 5 - 15  Ethanol     Status: None   Collection Time: 01/10/16  5:45 PM  Result Value Ref Range   Alcohol, Ethyl (B) <5 <5 mg/dL    Comment:        LOWEST DETECTABLE LIMIT FOR SERUM ALCOHOL IS 5  mg/dL FOR MEDICAL PURPOSES ONLY   Salicylate level     Status: None   Collection Time: 01/10/16  5:45 PM  Result Value Ref Range   Salicylate Lvl <9.5 2.8 - 30.0 mg/dL  Acetaminophen level     Status: Abnormal   Collection Time: 01/10/16  5:45 PM  Result Value Ref Range   Acetaminophen (Tylenol), Serum <10 (L) 10 - 30 ug/mL    Comment:        THERAPEUTIC CONCENTRATIONS VARY SIGNIFICANTLY. A RANGE OF 10-30 ug/mL  MAY BE AN EFFECTIVE CONCENTRATION FOR MANY PATIENTS. HOWEVER, SOME ARE BEST TREATED AT CONCENTRATIONS OUTSIDE THIS RANGE. ACETAMINOPHEN CONCENTRATIONS >150 ug/mL AT 4 HOURS AFTER INGESTION AND >50 ug/mL AT 12 HOURS AFTER INGESTION ARE OFTEN ASSOCIATED WITH TOXIC REACTIONS.   cbc     Status: None   Collection Time: 01/10/16  5:45 PM  Result Value Ref Range   WBC 6.1 4.0 - 10.5 K/uL   RBC 4.96 4.22 - 5.81 MIL/uL   Hemoglobin 13.2 13.0 - 17.0 g/dL   HCT 40.2 39.0 - 52.0 %   MCV 81.0 78.0 - 100.0 fL   MCH 26.6 26.0 - 34.0 pg   MCHC 32.8 30.0 - 36.0 g/dL   RDW 15.5 11.5 - 15.5 %   Platelets 198 150 - 400 K/uL    Current Facility-Administered Medications  Medication Dose Route Frequency Provider Last Rate Last Dose  . cloNIDine (CATAPRES) tablet 0.1 mg  0.1 mg Oral QID Corena Pilgrim, MD       Followed by  . [START ON 01/13/2016] cloNIDine (CATAPRES) tablet 0.1 mg  0.1 mg Oral BH-qamhs Corena Pilgrim, MD       Followed by  . [START ON 01/16/2016] cloNIDine (CATAPRES) tablet 0.1 mg  0.1 mg Oral QAC breakfast Vaudie Engebretsen, MD      . dicyclomine (BENTYL) tablet 20 mg  20 mg Oral Q6H PRN Corena Pilgrim, MD      . hydrOXYzine (ATARAX/VISTARIL) tablet 25 mg  25 mg Oral Q6H PRN Danta Baumgardner, MD      . loperamide (IMODIUM) capsule 2-4 mg  2-4 mg Oral PRN Corena Pilgrim, MD      . methocarbamol (ROBAXIN) tablet 500 mg  500 mg Oral Q8H PRN Draco Malczewski, MD      . naproxen (NAPROSYN) tablet 500 mg  500 mg Oral BID PRN Corena Pilgrim, MD      . ondansetron  (ZOFRAN-ODT) disintegrating tablet 4 mg  4 mg Oral Q6H PRN Ednah Hammock, MD      . pantoprazole (PROTONIX) EC tablet 40 mg  40 mg Oral Daily Norvell Caswell, MD      . risperiDONE (RISPERDAL) tablet 0.5 mg  0.5 mg Oral QHS Maalik Pinn, MD      . sertraline (ZOLOFT) tablet 50 mg  50 mg Oral Daily Corena Pilgrim, MD       Current Outpatient Prescriptions  Medication Sig Dispense Refill  . acetaminophen (TYLENOL) 325 MG tablet Take 2 tablets (650 mg total) by mouth every 6 (six) hours as needed for fever. (Patient not taking: Reported on 01/10/2016) 30 tablet 0  . dicyclomine (BENTYL) 20 MG tablet Take 1 tablet (20 mg total) by mouth 2 (two) times daily. 20 tablet 0  . omeprazole (PRILOSEC) 20 MG capsule Take 1 capsule (20 mg total) by mouth daily. 30 capsule 0  . ondansetron (ZOFRAN) 4 MG tablet Take 1 tablet (4 mg total) by mouth every 8 (eight) hours as needed for nausea or vomiting. (Patient not taking: Reported on 01/10/2016) 20 tablet 0    Musculoskeletal: Strength & Muscle Tone: within normal limits Gait & Station: normal Patient leans: N/A  Psychiatric Specialty Exam: Physical Exam  Psychiatric: His speech is normal. His affect is blunt. He is slowed, withdrawn and actively hallucinating. Cognition and memory are normal. He expresses impulsivity. He exhibits a depressed mood. He expresses suicidal ideation. He expresses suicidal plans.    Review of Systems  Constitutional: Negative.   HENT: Negative.   Eyes: Negative.  Respiratory: Negative.   Cardiovascular: Negative.   Gastrointestinal: Negative.   Genitourinary: Negative.   Musculoskeletal: Negative.   Skin: Negative.   Endo/Heme/Allergies: Negative.   Psychiatric/Behavioral: Positive for depression, hallucinations, substance abuse and suicidal ideas. The patient has insomnia.     Blood pressure 104/60, pulse 73, temperature 97.8 F (36.6 C), temperature source Oral, resp. rate 18, height '5\' 7"'  (1.702 m), weight  68 kg (150 lb), SpO2 100 %.Body mass index is 23.49 kg/m.  General Appearance: Disheveled  Eye Contact:  Minimal  Speech:  Clear and Coherent  Volume:  Decreased  Mood:  Depressed, Dysphoric and Hopeless  Affect:  Constricted and Flat  Thought Process:  Coherent and Descriptions of Associations: Intact  Orientation:  Full (Time, Place, and Person)  Thought Content:  Hallucinations: Auditory  Suicidal Thoughts:  Yes.  with intent/plan  Homicidal Thoughts:  No  Memory:  Immediate;   Fair Recent;   Good Remote;   Good  Judgement:  Poor  Insight:  Shallow  Psychomotor Activity:  Psychomotor Retardation  Concentration:  Concentration: Fair and Attention Span: Fair  Recall:  [place] of Knowledge:  Good  Language:  Good  Akathisia:  No  Handed:  Right  AIMS (if indicated):     Assets:  Communication Skills Desire for Improvement  ADL's:  Intact  Cognition:  WNL  Sleep:   poor     Treatment Plan Summary: Daily contact with patient to assess and evaluate symptoms and progress in treatment, Medication management  Start Sertraline 50 mg daily for depression Start Risperdal 0.21m qhs for psychosis Start Opiates detox protocol  Disposition: Recommend psychiatric Inpatient admission when medically cleared. Supportive therapy provided about ongoing stressors. Needs inpatient admission for stabilization  ACorena Pilgrim MD 01/11/2016 12:28 PM

## 2016-01-12 ENCOUNTER — Inpatient Hospital Stay (HOSPITAL_COMMUNITY)
Admission: AD | Admit: 2016-01-12 | Discharge: 2016-01-20 | DRG: 885 | Disposition: A | Discharge status: Home / Self Care | Payer: Federal, State, Local not specified - Other | Source: Intra-hospital | Attending: Psychiatry | Admitting: Psychiatry

## 2016-01-12 ENCOUNTER — Encounter (HOSPITAL_COMMUNITY): Payer: Self-pay | Admitting: *Deleted

## 2016-01-12 DIAGNOSIS — Z9102 Food additives allergy status: Secondary | ICD-10-CM | POA: Diagnosis not present

## 2016-01-12 DIAGNOSIS — F1920 Other psychoactive substance dependence, uncomplicated: Secondary | ICD-10-CM | POA: Diagnosis not present

## 2016-01-12 DIAGNOSIS — F19951 Other psychoactive substance use, unspecified with psychoactive substance-induced psychotic disorder with hallucinations: Secondary | ICD-10-CM | POA: Clinically undetermined

## 2016-01-12 DIAGNOSIS — F14259 Cocaine dependence with cocaine-induced psychotic disorder, unspecified: Secondary | ICD-10-CM | POA: Diagnosis present

## 2016-01-12 DIAGNOSIS — Z79899 Other long term (current) drug therapy: Secondary | ICD-10-CM | POA: Diagnosis not present

## 2016-01-12 DIAGNOSIS — Z86711 Personal history of pulmonary embolism: Secondary | ICD-10-CM | POA: Diagnosis not present

## 2016-01-12 DIAGNOSIS — F332 Major depressive disorder, recurrent severe without psychotic features: Secondary | ICD-10-CM | POA: Diagnosis not present

## 2016-01-12 DIAGNOSIS — F1120 Opioid dependence, uncomplicated: Secondary | ICD-10-CM | POA: Diagnosis present

## 2016-01-12 DIAGNOSIS — F28 Other psychotic disorder not due to a substance or known physiological condition: Secondary | ICD-10-CM | POA: Diagnosis present

## 2016-01-12 DIAGNOSIS — F0781 Postconcussional syndrome: Secondary | ICD-10-CM

## 2016-01-12 DIAGNOSIS — F17210 Nicotine dependence, cigarettes, uncomplicated: Secondary | ICD-10-CM | POA: Diagnosis present

## 2016-01-12 DIAGNOSIS — F1420 Cocaine dependence, uncomplicated: Secondary | ICD-10-CM

## 2016-01-12 DIAGNOSIS — Z86718 Personal history of other venous thrombosis and embolism: Secondary | ICD-10-CM

## 2016-01-12 DIAGNOSIS — F329 Major depressive disorder, single episode, unspecified: Secondary | ICD-10-CM | POA: Diagnosis present

## 2016-01-12 MED ORDER — ONDANSETRON 4 MG PO TBDP: 4.0000 mg | ORAL_TABLET | Freq: Four times a day (QID) | ORAL | Status: AC | PRN

## 2016-01-12 MED ORDER — PANTOPRAZOLE SODIUM 40 MG PO TBEC
40.0000 mg | DELAYED_RELEASE_TABLET | Freq: Every day | ORAL | Status: DC
  Administered 2016-01-13: 40 mg via ORAL
  Filled 2016-01-12 (×3): qty 1

## 2016-01-12 MED ORDER — CLONIDINE HCL 0.1 MG PO TABS
0.1000 mg | ORAL_TABLET | ORAL | Status: DC
  Filled 2016-01-12: qty 1

## 2016-01-12 MED ORDER — DICYCLOMINE HCL 20 MG PO TABS: 20.0000 mg | ORAL_TABLET | Freq: Four times a day (QID) | ORAL | Status: AC | PRN

## 2016-01-12 MED ORDER — LOPERAMIDE HCL 2 MG PO CAPS
2.0000 mg | ORAL_CAPSULE | ORAL | Status: AC | PRN
Start: 2016-01-12 — End: 2016-01-16

## 2016-01-12 MED ORDER — SERTRALINE HCL 50 MG PO TABS: 50.0000 mg | ORAL_TABLET | Freq: Every day | ORAL | Status: DC

## 2016-01-12 MED ORDER — METHOCARBAMOL 500 MG PO TABS: 500.0000 mg | ORAL_TABLET | Freq: Three times a day (TID) | ORAL | Status: DC | PRN

## 2016-01-12 MED ORDER — LORAZEPAM 1 MG PO TABS: 1.0000 mg | ORAL_TABLET | ORAL | Status: AC | PRN

## 2016-01-12 MED ORDER — CLONIDINE HCL 0.1 MG PO TABS: 0.1000 mg | ORAL_TABLET | Freq: Every day | ORAL | Status: DC

## 2016-01-12 MED ORDER — RISPERIDONE 1 MG PO TBDP
1.0000 mg | ORAL_TABLET | Freq: Every day | ORAL | Status: DC
  Administered 2016-01-12 – 2016-01-13 (×2): 1 mg via ORAL
  Filled 2016-01-12 (×4): qty 1

## 2016-01-12 MED ORDER — MAGNESIUM HYDROXIDE 400 MG/5ML PO SUSP: 30.0000 mL | Freq: Every day | ORAL | Status: DC | PRN

## 2016-01-12 MED ORDER — NAPROXEN 500 MG PO TABS: 500.0000 mg | ORAL_TABLET | Freq: Two times a day (BID) | ORAL | Status: AC | PRN

## 2016-01-12 MED ORDER — ACETAMINOPHEN 325 MG PO TABS
650.0000 mg | ORAL_TABLET | Freq: Four times a day (QID) | ORAL | Status: DC | PRN
  Administered 2016-01-14 – 2016-01-19 (×2): 650 mg via ORAL
  Filled 2016-01-12 (×2): qty 2

## 2016-01-12 MED ORDER — RISPERIDONE 0.5 MG PO TABS: 0.5000 mg | ORAL_TABLET | Freq: Every day | ORAL | Status: DC

## 2016-01-12 MED ORDER — CLONIDINE HCL 0.1 MG PO TABS
0.1000 mg | ORAL_TABLET | Freq: Four times a day (QID) | ORAL | Status: DC
  Filled 2016-01-12 (×6): qty 1

## 2016-01-12 MED ORDER — ALUM & MAG HYDROXIDE-SIMETH 200-200-20 MG/5ML PO SUSP: 30.0000 mL | ORAL | Status: DC | PRN

## 2016-01-12 MED ORDER — HYDROXYZINE HCL 25 MG PO TABS
25.0000 mg | ORAL_TABLET | Freq: Four times a day (QID) | ORAL | Status: AC | PRN
  Administered 2016-01-12 – 2016-01-13 (×2): 25 mg via ORAL
  Filled 2016-01-12 (×2): qty 1

## 2016-01-12 NOTE — [facility] Suicide Risk Assessment (Signed)
[facility]   Nursing information obtained from:    Demographic factors:    Current Mental Status:    Loss Factors:    Historical Factors:    Risk Reduction Factors:     Total Time spent with patient: 30 minutes Principal Problem: Hallucinosis Diagnosis:   Patient Active Problem List   Diagnosis Date Noted  . Major depressive disorder, recurrent severe without psychotic features (HCC) [F33.2] 01/12/2016  . Polysubstance (including opioids) dependence with physiol dependence (HCC) [F19.20] 01/11/2016  . 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 Pulmonary emboli (HCC) [I26.99] 01/16/2014  . Tobacco use [Z72.0] 01/16/2014  . Multiple pulmonary emboli (HCC) [I26.99] 01/16/2014  . Protein C deficiency (HCC) [D68.59] 12/31/2013  . Acute DVT (deep venous thrombosis) (HCC) [I82.409] 12/29/2013  . Pleuritic chest pain [R07.81] 12/29/2013  . Dyspnea [R06.00] 12/29/2013  . Chest pain [R07.9] 12/29/2013   Subjective Data: Patient reports command hallucinations recently and an overdose. He does deny current suicidal or homicidal ideation, plan or intent.  Continued Clinical Symptoms:    The "Alcohol Use Disorders Identification Test", Guidelines for Use in Primary Care, Second Edition.  World Science writerHealth Organization [employer](WHO). Score between 0-7:  no or low risk or alcohol related problems. Score between 8-15:  moderate risk of alcohol related problems. Score between 16-19:  high risk of alcohol related problems. Score 20 or above:  warrants further diagnostic evaluation for alcohol dependence and treatment.   CLINICAL FACTORS:   Currently Psychotic   Musculoskeletal: Strength & Muscle Tone: within normal limits Gait & Station: normal Patient leans: N/A  Psychiatric Specialty Exam: Physical Exam  ROS  Blood pressure 97/67, pulse 70, temperature 97.7 F (36.5 C), temperature source Oral, resp. rate 18, height 5\' 7"  (1.702 m), weight 66.2 kg (146 lb).Body mass index is  22.87 kg/m.   General Appearance: Casual  Eye Contact:  Fair  Speech:  Clear and Coherent  Volume:  Normal  Mood:  Anxious  Affect:  Appropriate and Congruent  Thought Process:  Coherent  Orientation:  Negative  Thought Content:  Hallucinations: Auditory  Suicidal Thoughts:  No  Homicidal Thoughts:  No  Memory:  Negative  Judgement:  Fair  Insight:  Fair  Psychomotor Activity:  Normal  Concentration:  Concentration: Good and Attention Span: Good  Recall:  Good  Fund of Knowledge:  Good  Language:  Good  Akathisia:  No  Handed:  Right  AIMS (if indicated):  0   Assets:  Desire for Improvement Resilience  ADL's:  Intact  Cognition:  WNL  Sleep:         COGNITIVE FEATURES THAT CONTRIBUTE TO RISK:  Loss of executive function    SUICIDE RISK:   Moderate:  Frequent suicidal ideation with limited intensity, and duration, some specificity in terms of plans, no associated intent, good self-control, limited dysphoria/symptomatology, some risk factors present, and identifiable protective factors, including available and accessible social support.   PLAN OF CARE: see PAA  I certify that inpatient services furnished can reasonably be expected to improve the patient's condition.  Acquanetta SitElizabeth Woods Oates, MD 01/12/2016, 3:59 PM

## 2016-01-12 NOTE — ED Notes (Signed)
[facility] All belongings returned and patient signed for. Pt calm and cooperative.

## 2016-01-12 NOTE — Tx Team (Signed)
Initial Treatment Plan 01/12/2016 6:22 PM Peter KrebsAlvin Anspaugh ZOX:[id number]    PATIENT STRESSORS: Financial difficulties Loss of significant relationship Occupational concerns Substance abuse   PATIENT STRENGTHS: Capable of independent living Motivation for treatment/growth Supportive family/friends   PATIENT IDENTIFIED PROBLEMS: At risk for suicide  Substance abuse  "work on my mental"  "substance abuse"               DISCHARGE CRITERIA:  Ability to meet basic life and health needs Improved stabilization in mood, thinking, and/or behavior Motivation to continue treatment in a less acute level of care Need for constant or close observation no longer present Reduction of life-threatening or endangering symptoms to within safe limits Verbal commitment to aftercare and medication compliance Withdrawal symptoms are absent or subacute and managed without 24-hour nursing intervention  PRELIMINARY DISCHARGE PLAN: Attend 12-step recovery group Outpatient therapy Return to previous living arrangement  PATIENT/FAMILY INVOLVEMENT: This treatment plan has been presented to and reviewed with the patient, Peter Washington.  The patient and family have been given the opportunity to ask questions and make suggestions.  Carleene OverlieMiddleton, Kessa Fairbairn P, RN 01/12/2016, 6:22 PM

## 2016-01-12 NOTE — Progress Notes (Addendum)
Pt informed CM he did not know who Maryann placey is even after Cm discussed she is a NP at [facility] or [facility] with Sharin MonsAnthony Steel MD.  Name removed as pcp in EPIC  Provided [facility] contact information Pt agreed to a referral Cm completed referral Pt to be contact by [facility] clinical liaison  ED CM left pt uninsured [place] resources in his locker #37 CM spoke with pt who [place]cp.  CM provided written information to assist pt with determining choice for uninsured accepting pcps, discussed the importance of pcp vs EDP services for f/u care, www.needymeds.org, www.goodrx.com, discounted pharmacies and other [place] resources such as [facility] , [facility], affordable care act, financial assistance, uninsured dental services, [place] med assist, DSS and  health department  Provided resources for [place] uninsured accepting pcps like [facility], [facility] [address], [facility], [facility], local urgent care centers, [facility], [facility], general medical clinics, [facility], [facility] urgent care plus others, medication resources, [facility] out patient pharmacies and housing Provided [facility] contact information

## 2016-01-12 NOTE — Progress Notes (Signed)
SBAR given to oncoming nurse.

## 2016-01-12 NOTE — Progress Notes (Signed)
D: Pt +ve AH - contracts for safety denies SI/HI/VH. Pt is pleasant and cooperative. Pt stated he was just getting used to ho the unit works, pt was in [employer] day[facility], but kept to himself. Pt forwards minimal information. Pt stated he was very concerned about the voices, pt appeared very apprehensive to discuss his AVh with Clinical research associatewriter, but he was encouraged to talk with the doctor about how the medication was making him feel.   A: Pt was offered support and encouragement. Pt was given scheduled medications. Pt was encourage to attend groups. Q 15 minute checks were done for safety.   R:Pt is taking medication. Pt has no complaints.Pt receptive to treatment and safety maintained on unit.

## 2016-01-12 NOTE — BH Assessment (Signed)
B[facility] Assessment Progress Note  Per Thedore MinsMojeed Akintayo, MD, this pt requires psychiatric hospitalization at this time.  Lillia AbedLindsay, RN, [phone]; she will call when they are ready to receive pt.  Pt has signed Voluntary Admission and Consent for Treatment, as well as Consent to Release Information, and signed forms [facility].  Pt's nurse has been notified, and agrees to send original paperwork along with pt via [facility], and to call report to [phone].  Doylene Canninghomas Lilla Callejo, MA Triage Specialist [phone]
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
## 2016-01-12 NOTE — BH Assessment (Signed)
Reassessment:  Dr. Darleene Cleaver and Waylan Boga, DNP, requested this writer to re-evaluate patient. Writer met with patient face to face. He continues to endorse auditory hallucinations. Sts that the voices in his head are command type and tell him to kill himself. Patient does not feel safe outside of the hospital stating that he resisting the voices in his head. He reports suicidal thoughts because of the voices. Patient experiencing a lot of stressors including breaking up with significant other and loosing his job. He reports using alcohol and drugs on the weekends to self medicate. Patient is calm and cooperative. He cooperated with the reassessment well.  Dr. Darleene Cleaver and Waylan Boga, DNP, continue to recommend INPT treatment. Patient assigned to 301[facility] at [facility].  [facility] will call TTS when bed becomes available.

## 2016-01-12 NOTE — ED Notes (Signed)
Attempted to give report. Bed not available.

## 2016-01-12 NOTE — [facility] Group Notes (Signed)
[name] was invited to attend group.  Did not attend.      spiritual care group on grief and loss facilitated by chaplain Burnis KingfisherMatthew Ayda Tancredi   Group opened with brief discussion and psycho-social ed around grief and loss in relationships and in relation to self - identifying life patterns, circumstances, changes that cause losses. Established group norm of speaking from own life experience. Group goal of establishing open and affirming space for members to share loss and experience with grief, normalize grief experience and provide psycho social education and grief support.

## 2016-01-12 NOTE — Progress Notes (Signed)
[facility] Washington [facility]. Reported to this Clinical research associatewriter, that patient was found eating leaves and was asking for help. Someone called to get patient help. Reported to have depression at 6, anxiety at 8, and hopelessness at 2. Patient gave brief eye contact, appeared depressed, and spoke soft and slow. Patient denied SI, HI, and AVH. Update received by Candise Bowensatreka RN.   Patient safety maintained through q 15 min checks. Patient given education, support, and encouragement.   Patient is receptive and compliant, will continue to monitor.

## 2016-01-12 NOTE — Progress Notes (Signed)
Entered in d/c instructions Information was [facility] [place]     [place] self [place]?MD for you  [name] will call or send you a postcard to see if you can come to meet with them to see if you are eligible for services  Call them in 3 days if you have not heard from them [phone]
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
## 2016-01-13 ENCOUNTER — Inpatient Hospital Stay (HOSPITAL_COMMUNITY): Payer: Federal, State, Local not specified - Other

## 2016-01-13 ENCOUNTER — Other Ambulatory Visit (HOSPITAL_COMMUNITY): Payer: Self-pay | Admitting: Radiology

## 2016-01-13 LAB — LIPID PANEL
Cholesterol: 178 mg/dL (ref 0–200)
HDL: 95 mg/dL (ref >40)
LDL CALC: 68 mg/dL (ref 0–99)
Total CHOL/HDL Ratio: 1.9 RATIO
Triglycerides: 77 mg/dL (ref <150)
VLDL: 15 mg/dL (ref 0–40)

## 2016-01-13 LAB — RPR: RPR: NONREACTIVE

## 2016-01-13 LAB — VITAMIN B12: Vitamin B-12: 233 pg/mL (ref 180–914)

## 2016-01-13 LAB — TSH: TSH: 3.624 u[IU]/mL (ref 0.350–4.500)

## 2016-01-13 MED ORDER — GADOBENATE DIMEGLUMINE 529 MG/ML IV SOLN
14.0000 mL | Freq: Once | INTRAVENOUS | Status: AC | PRN
  Administered 2016-01-13: 14 mL via INTRAVENOUS
  Filled 2016-01-13: qty 14

## 2016-01-13 NOTE — Progress Notes (Signed)
Recreation Therapy Notes  Animal-Assisted Activity (AAA) Program Checklist/Progress Notes Patient Eligibility Criteria Checklist & Daily Group note for Rec TxIntervention  Date: 10.31.2017 Time: 2:45pm Location: [address] Dayroom    AAA/T Program Assumption of Risk Form signed by Patient/ or Parent Legal Guardian Yes  Patient is free of allergies or sever asthma Yes  Patient reports no fear of animals Yes  Patient reports no history of cruelty to animals Yes  Patient understands his/her participation is voluntary Yes  Behavioral Response: Did not attend.   Anastacia Reinecke L Orena Cavazos, LRT/CTRS  Tovia Kisner L 01/13/2016 2:58 PM 

## 2016-01-13 NOTE — Progress Notes (Signed)
Adult Psychoeducational Group Note  Date:  01/13/2016 Time:  9:37 PM  Group Topic/Focus:  Wrap-Up Group:   The focus of this group is to help patients review their daily goal of treatment and discuss progress on daily workbooks.   Participation Level:  Active  Participation Quality:  Appropriate  Affect:  Appropriate  Cognitive:  Appropriate  Insight: Appropriate  Engagement in Group:  Engaged  Modes of Intervention:  Discussion  Additional Comments:  Patients goal today was to not feel suicidal. He states that he is feeling sad but doesn't know why.  Patient is a 2 out of 10.   Peter Washington L Clodfelter-Simmons 01/13/2016, 9:37 PM

## 2016-01-13 NOTE — Progress Notes (Signed)
D: Pt still endorsing AH/ SI- contracts for safety. Pt is pleasant and cooperative. Pt back from getting his MRI, pt observed sitting in dayroom reading until time to go to sleep.   A: Pt was offered support and encouragement. Pt was given scheduled medications. Pt was encourage to attend groups. Q 15 minute checks were done for safety.   R: Pt is taking medication. Pt has no complaints at this time.Pt receptive to treatment and safety maintained on unit.

## 2016-01-13 NOTE — Plan of Care (Signed)
Problem: Coping: Goal: Ability to verbalize frustrations and anger appropriately will improve Outcome: Progressing Pt stated he was still having issues eith the Hallucinations, but was hopeful the medications were going to help

## 2016-01-13 NOTE — Plan of Care (Signed)
Problem: Safety: Goal: Ability to remain free from injury will improve Outcome: Progressing Safety maintained on unit

## 2016-01-13 NOTE — Tx Team (Signed)
Interdisciplinary Treatment and Diagnostic Plan Update  01/13/2016 Time of Session: 9:30AM Melton Krebslvin Colquhoun MRN: [id number]  Principal Diagnosis: Hallucinosis  Secondary Diagnoses: Principal Problem:   Hallucinosis   Current Medications:  Current Facility-Administered Medications  Medication Dose Route Frequency Provider Last Rate Last Dose  . acetaminophen (TYLENOL) tablet 650 mg  650 mg Oral Q6H PRN Charm RingsJamison Y Lord, NP      . alum & mag hydroxide-simeth (MAALOX/MYLANTA) 200-200-20 MG/5ML suspension 30 mL  30 mL Oral Q4H PRN Charm RingsJamison Y Lord, NP      . dicyclomine (BENTYL) tablet 20 mg  20 mg Oral Q6H PRN Charm RingsJamison Y Lord, NP      . hydrOXYzine (ATARAX/VISTARIL) tablet 25 mg  25 mg Oral Q6H PRN Charm RingsJamison Y Lord, NP   25 mg at 01/12/16 2202  . loperamide (IMODIUM) capsule 2-4 mg  2-4 mg Oral PRN Charm RingsJamison Y Lord, NP      . LORazepam (ATIVAN) tablet 1 mg  1 mg Oral Q4H PRN Acquanetta SitElizabeth Woods Oates, MD      . magnesium hydroxide (MILK OF MAGNESIA) suspension 30 mL  30 mL Oral Daily PRN Charm RingsJamison Y Lord, NP      . naproxen (NAPROSYN) tablet 500 mg  500 mg Oral BID PRN Charm RingsJamison Y Lord, NP      . ondansetron (ZOFRAN-ODT) disintegrating tablet 4 mg  4 mg Oral Q6H PRN Charm RingsJamison Y Lord, NP      . pantoprazole (PROTONIX) EC tablet 40 mg  40 mg Oral Daily Charm RingsJamison Y Lord, NP   40 mg at 01/13/16 0805  . risperiDONE (RISPERDAL M-TABS) disintegrating tablet 1 mg  1 mg Oral QHS Acquanetta SitElizabeth Woods Oates, MD   1 mg at 01/12/16 2202   PTA Medications: Prescriptions Prior to Admission  Medication Sig Dispense Refill Last Dose  . acetaminophen (TYLENOL) 325 MG tablet Take 2 tablets (650 mg total) by mouth every 6 (six) hours as needed for fever. (Patient not taking: Reported on 01/10/2016) 30 tablet 0 Not Taking at Unknown time  . dicyclomine (BENTYL) 20 MG tablet Take 1 tablet (20 mg total) by mouth 2 (two) times daily. 20 tablet 0   . omeprazole (PRILOSEC) 20 MG capsule Take 1 capsule (20 mg total) by mouth daily. 30 capsule 0    . ondansetron (ZOFRAN) 4 MG tablet Take 1 tablet (4 mg total) by mouth every 8 (eight) hours as needed for nausea or vomiting. (Patient not taking: Reported on 01/10/2016) 20 tablet 0 Not Taking at Unknown time    Patient Stressors: Financial difficulties Loss of significant relationship Occupational concerns Substance abuse  Patient Strengths: Capable of independent living Motivation for treatment/growth Supportive family/friends  Treatment Modalities: Medication Management, Group therapy, Case management,  1 to 1 session with clinician, Psychoeducation, Recreational therapy.   Physician Treatment Plan for Primary Diagnosis: Hallucinosis Long Term Goal(s): Improvement in symptoms so as ready for discharge Improvement in symptoms so as ready for discharge   Short Term Goals: Ability to verbalize feelings will improve Ability to disclose and discuss suicidal ideas Ability to identify changes in lifestyle to reduce recurrence of condition will improve Ability to identify and develop effective coping behaviors will improve Compliance with prescribed medications will improve  Medication Management: Evaluate patient's response, side effects, and tolerance of medication regimen.  Therapeutic Interventions: 1 to 1 sessions, Unit Group sessions and Medication administration.  Evaluation of Outcomes: Progressing  Physician Treatment Plan for Secondary Diagnosis: Principal Problem:   Hallucinosis  Long Term Goal(s): Improvement in  symptoms so as ready for discharge Improvement in symptoms so as ready for discharge   Short Term Goals: Ability to verbalize feelings will improve Ability to disclose and discuss suicidal ideas Ability to identify changes in lifestyle to reduce recurrence of condition will improve Ability to identify and develop effective coping behaviors will improve Compliance with prescribed medications will improve     Medication Management: Evaluate patient's  response, side effects, and tolerance of medication regimen.  Therapeutic Interventions: 1 to 1 sessions, Unit Group sessions and Medication administration.  Evaluation of Outcomes: Progressing   RN Treatment Plan for Primary Diagnosis: Hallucinosis Long Term Goal(s): Knowledge of disease and therapeutic regimen to maintain health will improve  Short Term Goals: Ability to remain free from injury will improve, Ability to disclose and discuss suicidal ideas and Ability to identify and develop effective coping behaviors will improve  Medication Management: RN will administer medications as ordered by provider, will assess and evaluate patient's response and provide education to patient for prescribed medication. RN will report any adverse and/or side effects to prescribing provider.  Therapeutic Interventions: 1 on 1 counseling sessions, Psychoeducation, Medication administration, Evaluate responses to treatment, Monitor vital signs and CBGs as ordered, Perform/monitor CIWA, COWS, AIMS and Fall Risk screenings as ordered, Perform wound care treatments as ordered.  Evaluation of Outcomes: Progressing   LCSW Treatment Plan for Primary Diagnosis: Hallucinosis Long Term Goal(s): Safe transition to appropriate next level of care at discharge, Engage patient in therapeutic group addressing interpersonal concerns.  Short Term Goals: Engage patient in aftercare planning with referrals and resources, Facilitate patient progression through stages of change regarding substance use diagnoses and concerns and Identify triggers associated with mental health/substance abuse issues  Therapeutic Interventions: Assess for all discharge needs, 1 to 1 time with Social worker, Explore available resources and support systems, Assess for adequacy in community support network, Educate family and significant other(s) on suicide prevention, Complete Psychosocial Assessment, Interpersonal group therapy.  Evaluation of  Outcomes: Progressing   Progress in Treatment: Attending groups: No. New to unit. Continuing to assess.  Participating in groups: No. Taking medication as prescribed: Yes. Toleration medication: Yes. Family/Significant other contact made: No, will contact:  family member if pt consents Patient understands diagnosis: Yes. Discussing patient identified problems/goals with staff: Yes. Medical problems stabilized or resolved: Yes. Denies suicidal/homicidal ideation: Yes-self report.  Issues/concerns per patient self-inventory: Yes Other: pt reporting AVH/appears to be poor historian regarding his substance use/abuse.  New problem(s) identified: Yes, Describe:  poor historian-inconsistencies with substance use reported by patient and labs  New Short Term/Long Term Goal(s): medication stabilization, elimination of AVH; detox.   Discharge Plan or Barriers: CSW assessing for appropriate referrals. [facility]. Pt was in the ED 3x in 48 hours reporting AVH-command in nature.   Reason for Continuation of Hospitalization: Depression Hallucinations Medication stabilization Withdrawal symptoms  Estimated Length of Stay:3-5 days   Attendees: Patient: 01/13/2016 10:49 AM  Physician: Dr. Mckinley Jewelates MD 01/13/2016 10:49 AM  Nursing: Barkley BoardsKaren, Caroline, Britney T RN 01/13/2016 10:49 AM  RN Care Manager: Onnie BoerJennifer Clark CM 01/13/2016 10:49 AM  Social Worker: Trula SladeHeather Smart, LCSW 01/13/2016 10:49 AM  Recreational Therapist:  01/13/2016 10:49 AM  Other:  01/13/2016 10:49 AM  Other:  01/13/2016 10:49 AM  Other: 01/13/2016 10:49 AM    Scribe for Treatment Team: Ledell PeoplesHeather N Smart, LCSW 01/13/2016 10:49 AM
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
## 2016-01-13 NOTE — Progress Notes (Signed)
[facility] Progress Note  01/13/2016 1:33 PM  Patient Active Problem List   Diagnosis Date Noted  . 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 Polysubstance (including opioids) dependence with physiol dependence (HCC) 01/11/2016  . Hallucinosis 01/11/2016  . Pulmonary emboli (HCC) 01/16/2014  . Tobacco use 01/16/2014  . Multiple pulmonary emboli (HCC) 01/16/2014  . Protein C deficiency (HCC) 12/31/2013  . Acute DVT (deep venous thrombosis) (HCC) 12/29/2013  . Pleuritic chest pain 12/29/2013  . Dyspnea 12/29/2013  . Chest pain 12/29/2013    Diagnosis: Depressive symptoms, substance abuse, possible CT E  Subjective: Patient denies acute suicidal or homicidal ideation, plan or intent today. He was informed that he has been scheduled for an MRI and we discussed the test and reasoning for it and patient is looking forward to continuing his workup.  Objective: Well-developed well-nourished man in no apparent distress pleasant and appropriate speech and motor are unremarkable mood is all right affect is congruent thought processes appear linear and goal-directed thought content denies current auditory hallucinations or current suicidal or homicidal ideation, plan or intent alert and oriented, he gives the impression that he may have some processing deficits in some areas although IQ probably remains overall in the average range insight and judgment are fair         Current Facility-Administered Medications (Analgesics):  .  acetaminophen (TYLENOL) tablet 650 mg .  naproxen (NAPROSYN) tablet 500 mg     Current Facility-Administered Medications (Other):  .  alum & mag hydroxide-simeth (MAALOX/MYLANTA) 200-200-20 MG/5ML suspension 30 mL .  dicyclomine (BENTYL) tablet 20 mg .  hydrOXYzine (ATARAX/VISTARIL) tablet 25 mg .  loperamide (IMODIUM) capsule 2-4 mg .  LORazepam (ATIVAN) tablet 1 mg .  magnesium hydroxide (MILK OF MAGNESIA)  suspension 30 mL .  ondansetron (ZOFRAN-ODT) disintegrating tablet 4 mg .  pantoprazole (PROTONIX) EC tablet 40 mg .  risperiDONE (RISPERDAL M-TABS) disintegrating tablet 1 mg  No current outpatient prescriptions on file.  Vital Signs:Blood pressure 115/78, pulse 65, temperature 98.5 F (36.9 C), temperature source Oral, resp. rate 18, height 5\' 7"  (1.702 m), weight 66.2 kg (146 lb).    Lab Results:  Results for orders placed or performed during the hospital encounter of 01/12/16 (from the past 48 hour(s))  Vitamin B12     Status: None   Collection Time: 01/13/16  6:25 AM  Result Value Ref Range   Vitamin B-12 233 180 - 914 pg/mL    Comment: (NOTE) This assay is not validated for testing neonatal or myeloproliferative syndrome specimens for Vitamin B12 levels. Performed at [facility]   TSH     Status: None   Collection Time: 01/13/16  6:25 AM  Result Value Ref Range   TSH 3.624 0.350 - 4.500 uIU/mL    Comment: Performed by a 3rd Generation assay with a functional sensitivity of <=0.01 uIU/mL. Performed at [facility]   Lipid panel     Status: None   Collection Time: 01/13/16  6:25 AM  Result Value Ref Range   Cholesterol 178 0 - 200 mg/dL   Triglycerides 77 <161<150 mg/dL   HDL 95 >09>40 mg/dL   Total CHOL/HDL Ratio 1.9 RATIO   VLDL 15 0 - 40 mg/dL   LDL Cholesterol 68 0 - 99 mg/dL    Comment:        Total Cholesterol/HDL:CHD Risk Coronary Heart Disease Risk Table  Men   Women  1/2 Average Risk   3.4   3.3  Average Risk       5.0   4.4  2 X Average Risk   9.6   7.1  3 X Average Risk  23.4   11.0        Use the calculated Patient Ratio above and the CHD Risk Table to determine the patient's CHD Risk.        ATP III CLASSIFICATION (LDL):  <100     mg/dL   Optimal  [id number]  mg/dL   Near or Above                    Optimal  130-159  mg/dL   Borderline  [id number]  mg/dL   High  >130>190     mg/dL   Very High Performed at [facility]     Physical Findings: AIMS: Facial and Oral Movements Muscles of Facial Expression: None, normal Lips and Perioral Area: None, normal Jaw: None, normal Tongue: None, normal,Extremity Movements Upper (arms, wrists, hands, fingers): None, normal Lower (legs, knees, ankles, toes): None, normal, Trunk Movements Neck, shoulders, hips: None, normal, Overall Severity Severity of abnormal movements (highest score from questions above): None, normal Incapacitation due to abnormal movements: None, normal Patient's awareness of abnormal movements (rate only patient's report): No Awareness, Dental Status Current problems with teeth and/or dentures?: No Does patient usually wear dentures?: No  CIWA:  CIWA-Ar Total: 1 COWS:  COWS Total Score: 3   Assessment/Plan: Patient is detoxing without incident and currently denies any acute psychiatric symptoms of wanting to harm self or others or bothersome hallucinations. He will have an MRI today to evaluate symptoms of possible CTE in a person with extensive boxing history. Once patient has stabilized plan is to have him follow-up further with outpatient treatment.  Acquanetta SitElizabeth Woods Sirenia Whitis, MD 01/13/2016, 1:33 PM

## 2016-01-13 NOTE — Progress Notes (Signed)
Patient transported by [facility] for [facility]; accompanied by Reggie, NT

## 2016-01-13 NOTE — Progress Notes (Addendum)
D:  Patient awake and alert; oriented x 4; reports passive suicidal ideation with no plan; also complains of auditory/visual hallucinations no self-injurous behaviors noted or reported. Verbally contracts for safety A:  Medications given as scheduled;  Emotional support provided; encouraged him to seek assistance with needs/concerns. R:  Safety maintained on unit.

## 2016-01-14 LAB — HEMOGLOBIN A1C
HEMOGLOBIN A1C: 5.5 % (ref 4.8–5.6)
MEAN PLASMA GLUCOSE: 111 mg/dL

## 2016-01-14 MED ORDER — CITALOPRAM HYDROBROMIDE 20 MG PO TABS
20.0000 mg | ORAL_TABLET | Freq: Every day | ORAL | Status: DC
  Administered 2016-01-14 – 2016-01-19 (×6): 20 mg via ORAL
  Filled 2016-01-14 (×7): qty 1

## 2016-01-14 MED ORDER — CITALOPRAM HYDROBROMIDE 20 MG PO TABS: 20.0000 mg | ORAL_TABLET | Freq: Every day | ORAL | Status: DC

## 2016-01-14 MED ORDER — RISPERIDONE 2 MG PO TBDP: 2.0000 mg | ORAL_TABLET | Freq: Every day | ORAL | Status: DC

## 2016-01-14 MED ORDER — HALOPERIDOL 2 MG PO TABS
2.0000 mg | ORAL_TABLET | Freq: Every day | ORAL | Status: DC
  Administered 2016-01-14: 2 mg via ORAL
  Filled 2016-01-14 (×2): qty 1

## 2016-01-14 NOTE — [facility] Group Notes (Signed)
[facility] LCSW Group Therapy  01/14/2016 2:35 PM  Type of Therapy:  Group Therapy  Participation Level:  Active  Participation Quality:  Attentive  Affect:  Appropriate  Cognitive:  Alert and Oriented  Insight:  Engaged  Engagement in Therapy:  Engaged  Modes of Intervention:  Confrontation, Discussion, Education, Problem-solving, Rapport Building, Socialization and Support  Summary of Progress/Problems:  Medical illustratorinding Balance in Life. Today's group focused on defining balance in one's own words, identifying things that can knock one off balance, and exploring healthy ways to maintain balance in life. Group members were asked to provide an example of a time when they felt off balance, describe how they handled that situation,and process healthier ways to regain balance in the future. Group members were asked to share the most important tool for maintaining balance that they learned [facility] and how they plan to apply this method after discharge. [name] was attentive and engaged during today's processing group. He shared that he needs to "learn how to accept what is." "I just found out that I have mild brain damage from my MRI yesterday. It's given me lots of answers about why I've been struggling with memory loss and mood fluctuations all these years." [name] talked about how he must "change how I approach things" and can now accept his limitations for what they really are. He continues to show progress in the group setting with improving insight.   Kaley Jutras N Smart LCSW 01/14/2016, 2:35 PM

## 2016-01-14 NOTE — Progress Notes (Signed)
Recreation Therapy Notes  Date: 01/14/16 Time: 0930 Location: [address] Hall Dayroom  Group Topic: Stress Management  Goal Area(s) Addresses:  Patient will verbalize importance of using healthy stress management.  Patient will identify positive emotions associated with healthy stress management.   Intervention: Calm App  Activity :  Letting Go Meditation.  LRT introduced the stress management technique of meditation.  LRT played a meditation on letting go from the Callm App to get patients engaged in the activity.  Patients were to follow along with the meditation as it played.  Education:  Stress Management, Discharge Planning.   Education Outcome: Acknowledges edcuation/In group clarification offered/Needs additional education  Clinical Observations/Feedback: Pt did not attend group.     Caroll RancherMarjette Elis Sauber, LRT/CTRS         Caroll RancherLindsay, Staley Budzinski A 01/14/2016 11:15 AM

## 2016-01-14 NOTE — Progress Notes (Signed)
Patient attended NA group meeting. 

## 2016-01-14 NOTE — Progress Notes (Signed)
Patient ID: Peter Washington, male   DOB: 11/13/1965, 50 y.o.   MRN: [id number]  Pt currently presents with a masked affect and labile behavior. Pt reports to Clinical research associatewriter that their goal is to "sleep better." Pt states "I think they gave me Zoloft at the ED when I was there and it made me feel better." Pt reports poor sleep with current medication regimen, documented sleep time was 6.25 hours. Pt seen in the dayroom stumbling and states to writer "did you see that? That happens a lot."   Pt provided with medications per providers orders. Pt's labs and vitals were monitored throughout the night. Pt supported emotionally and encouraged to express concerns and questions. Pt educated on medications and alternative relaxation techniques. Assessment shows patient to be a moderate fall risk, precautions implemented, pt educated.   Pt's safety ensured with 15 minute and environmental checks. Pt currently denies SI/HI. Pt currently endorses A/V hallucinations. Pt reports that he hears and sees "happy people." Pt verbally agrees to seek staff if SI/HI occurs and to consult with staff before acting on any harmful thoughts. Will continue POC.

## 2016-01-14 NOTE — Progress Notes (Signed)
Peter Washington [facility]. [facility] referral pending-no beds available today. Likely no available beds for Thursday.   Trula SladeHeather Smart, MSW, LCSW Clinical Social Worker 01/14/2016 2:46 PM

## 2016-01-14 NOTE — [facility] Suicide Risk Assessment (Signed)
[facility] INPATIENT:  Family/Significant Other Suicide Prevention Education  Suicide Prevention Education:  Patient Refusal for Family/Significant Other Suicide Prevention Education: The patient Peter Washington has refused to provide written consent for family/significant other to be provided Family/Significant Other Suicide Prevention Education during admission and/or prior to discharge.  Physician notified.  SPE completed with pt, as pt refused to consent to family contact. SPI pamphlet provided to pt and pt was encouraged to share information with support network, ask questions, and talk about any concerns relating to SPE. Pt denies access to guns/firearms and verbalized understanding of information provided. Mobile Crisis information also provided to pt.   Morio Widen N Smart LCSW 01/14/2016, 8:54 AM

## 2016-01-14 NOTE — Progress Notes (Signed)
D:  Patient awake and alert; oriented x 4; reports passive suicidal ideation with no plan; also complains of auditory/visual hallucinations no self-injurous behaviors noted or reported. Verbally contracts for safety A:  Medications given as scheduled;  Emotional support provided; encouraged him to seek assistance with needs/concerns. R:  Safety maintained on unit. 

## 2016-01-14 NOTE — Progress Notes (Signed)
[facility] MD Progress Note  01/14/2016 10:37 AM  Patient Active Problem List   Diagnosis Date Noted  . Major depressive disorder, recurrent severe without psychotic features ([place]) 01/12/2016  . Polysubstance (including opioids) dependence with physiol dependence ([place]) 01/11/2016  . Hallucinosis 01/11/2016  . Pulmonary emboli ([place]) 01/16/2014  . Tobacco use 01/16/2014  . Multiple pulmonary emboli ([place]) 01/16/2014  . Protein C deficiency ([place]) 12/31/2013  . Acute DVT (deep venous thrombosis) ([place]) 12/29/2013  . Pleuritic chest pain 12/29/2013  . Dyspnea 12/29/2013  . Chest pain 12/29/2013    Diagnosis: Depression, auditory hallucinations cognitive complaints substance use disorder  Subjective: Met with patient and discussed results. Lab results for B12 are within normal limits but towards the lower end of normal and it was suggested to patient that he would benefit from a month or 2 of oral B12 supplementation. TSH and RPR were negative or within normal limits. MRI done yesterday showed mild atrophy. Results were discussed with patient and patient had the opportunity to ask questions and did so. He does report some ongoing depression and auditory hallucinations does not think his current risperidone is working. After discussion of the risks, benefits, alternatives, dosing, rationale expected effects and side effects patient agrees to a trial of Haldol and Celexa at this time. Aims exam yesterday was 0  Objective: Well-developed well-nourished man in no apparent distress pleasant and appropriate mood is described as depressed affect is somewhat anxious thought processes linear and goal directed thought content endorses ongoing auditory hallucinations no current plan or intent to act on any suicidal or homicidal ideation, plan or intent alert and oriented 3 IQ appears an average range insight and judgment are fair         Current Facility-Administered Medications (Analgesics):  .   acetaminophen (TYLENOL) tablet 650 mg .  naproxen (NAPROSYN) tablet 500 mg     Current Facility-Administered Medications (Other):  .  alum & mag hydroxide-simeth (MAALOX/MYLANTA) 200-200-20 MG/5ML suspension 30 mL .  citalopram (CELEXA) tablet 20 mg .  dicyclomine (BENTYL) tablet 20 mg .  haloperidol (HALDOL) tablet 2 mg .  hydrOXYzine (ATARAX/VISTARIL) tablet 25 mg .  loperamide (IMODIUM) capsule 2-4 mg .  LORazepam (ATIVAN) tablet 1 mg .  magnesium hydroxide (MILK OF MAGNESIA) suspension 30 mL .  ondansetron (ZOFRAN-ODT) disintegrating tablet 4 mg  No current outpatient prescriptions on file.  Vital Signs:Blood pressure 98/73, pulse 72, temperature 98 F (36.7 C), temperature source Oral, resp. rate 16, height '5\' 7"'  (1.702 m), weight 66.2 kg (146 lb).    Lab Results:  Results for orders placed or performed during the hospital encounter of 01/12/16 (from the past 48 hour(s))  Hemoglobin A1c     Status: None   Collection Time: 01/13/16  6:25 AM  Result Value Ref Range   Hgb A1c MFr Bld 5.5 4.8 - 5.6 %    Comment: (NOTE)         Pre-diabetes: 5.7 - 6.4         Diabetes: >6.4         Glycemic control for adults with diabetes: <7.0    Mean Plasma Glucose 111 mg/dL    Comment: (NOTE) Performed At: [facility] [place], [place] [id number] Lindon Romp MD [id number] Performed at [facility]   Vitamin B12     Status: None   Collection Time: 01/13/16  6:25 AM  Result Value Ref Range   Vitamin B-12 233 180 - 914 pg/mL  Comment: (NOTE) This assay is not validated for testing neonatal or myeloproliferative syndrome specimens for Vitamin B12 levels. Performed at [facility]   TSH     Status: None   Collection Time: 01/13/16  6:25 AM  Result Value Ref Range   TSH 3.624 0.350 - 4.500 uIU/mL    Comment: Performed by a 3rd Generation assay with a functional sensitivity of <=0.01 uIU/mL. Performed at [facility]   Lipid panel     Status: None   Collection Time: 01/13/16  6:25 AM  Result Value Ref Range   Cholesterol 178 0 - 200 mg/dL   Triglycerides 77 <150 mg/dL   HDL 95 >40 mg/dL   Total CHOL/HDL Ratio 1.9 RATIO   VLDL 15 0 - 40 mg/dL   LDL Cholesterol 68 0 - 99 mg/dL    Comment:        Total Cholesterol/HDL:CHD Risk Coronary Heart Disease Risk Table                     Men   Women  1/2 Average Risk   3.4   3.3  Average Risk       5.0   4.4  2 X Average Risk   9.6   7.1  3 X Average Risk  23.4   11.0        Use the calculated Patient Ratio above and the CHD Risk Table to determine the patient's CHD Risk.        ATP III CLASSIFICATION (LDL):  <100     mg/dL   Optimal  100-129  mg/dL   Near or Above                    Optimal  130-159  mg/dL   Borderline  160-189  mg/dL   High  >190     mg/dL   Very High Performed at [facility]   RPR     Status: None   Collection Time: 01/13/16  6:25 AM  Result Value Ref Range   RPR Ser Ql Non Reactive Non Reactive    Comment: (NOTE) Performed At: [facility] [place], [place] [id number] Lindon Romp MD [id number] Performed at [facility]     Physical Findings: AIMS: Facial and Oral Movements Muscles of Facial Expression: None, normal Lips and Perioral Area: None, normal Jaw: None, normal Tongue: None, normal,Extremity Movements Upper (arms, wrists, hands, fingers): None, normal Lower (legs, knees, ankles, toes): None, normal, Trunk Movements Neck, shoulders, hips: None, normal, Overall Severity Severity of abnormal movements (highest score from questions above): None, normal Incapacitation due to abnormal movements: None, normal Patient's awareness of abnormal movements (rate only patient's report): No Awareness, Dental Status Current problems with teeth and/or dentures?: No Does patient usually wear dentures?: No  CIWA:  CIWA-Ar Total: 1 COWS:  COWS  Total Score: 1   Assessment/Plan: Patient feels he is not having any response to the risperidone. He does not have insurance and will be paying with his own funds for medication. In order to help with excess and to provide a more robust medication response the patient will be started on Haldol and Celexa as mentioned above. We'll continue to be available to answer any questions may have about any lab results as well. Patient states he is interested in attending longer-term programming and should work with the Education officer, museum to explore his options.  Linard Millers, MD  01/14/2016, 10:37 AM

## 2016-01-14 NOTE — Plan of Care (Signed)
Problem: Safety: Goal: Ability to remain free from injury will improve Outcome: Progressing Safety maintained on unit  Problem: Medication: Goal: Compliance with prescribed medication regimen will improve Outcome: Progressing Compliant with medication regimen  Problem: Self-Concept: Goal: Ability to disclose and discuss suicidal ideas will improve Outcome: Progressing Verbally contracts for safety

## 2016-01-15 MED ORDER — TRAZODONE HCL 50 MG PO TABS
50.0000 mg | ORAL_TABLET | Freq: Every evening | ORAL | Status: DC | PRN
  Administered 2016-01-15 – 2016-01-16 (×2): 50 mg via ORAL
  Filled 2016-01-15 (×8): qty 1

## 2016-01-15 NOTE — Progress Notes (Signed)
DAR NOTE: Patient presents with anxious affect and depressed mood. Pt has been observed in the day room interacting with both peers and staff, pt stated he through periods where he become very depressed and periods where he feel just fine. Pt also stated he has enlarged prostate which make it difficulty for him to urinate sometimes. Denies pain, auditory and visual hallucinations.  Rates depression at  8, hopelessness at 0, and anxiety at 3.  Maintained on routine safety checks.  Medications given as prescribed.  Support and encouragement offered as needed.  Attended group and participated.  States goal for today is "getting medicine."  Offered no complaint.

## 2016-01-15 NOTE — [facility] Group Notes (Signed)
[facility] Group Notes:  (Nursing/MHT/Case Management/Adjunct)  Date:  01/15/2016 . Time:  4:00 PM  Type of Therapy:  Psychoeducational Skills  Participation Level:  Active  Participation Quality:  Appropriate  Affect:  Appropriate  Cognitive:  Appropriate  Insight:  Appropriate  Engagement in Group:  Engaged  Modes of Intervention:  Problem-solving  Summary of Progress/Problems:  Group encouraged to surround themselves with positive and healthy group/support system when changing to a healthy life style.     Bethann PunchesJane O Loa Idler 01/15/2016, 4:00 PM

## 2016-01-15 NOTE — Progress Notes (Signed)
[facility]  01/15/2016 2:15 PM  Patient Active Problem List   Diagnosis Date Noted  . 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 Polysubstance (including opioids) dependence with physiol dependence (HCC) 01/11/2016  . Hallucinosis 01/11/2016  . Pulmonary emboli (H[facility]) 01/16/2014  . Tobacco use 01/16/2014  . Multiple pulmonary emboli (HCC) 01/16/2014  . Protein C deficiency (HCC) 12/31/2013  . Acute DVT (deep venous thrombosis) (H[facility]) 12/29/2013  . Pleuritic chest pain 12/29/2013  . Dyspnea 12/29/2013  . Chest pain 12/29/2013    Diagnosis: Depression, substance use disorder and recent hallucinations  Subjective: Patient reports his mood is improved and his hallucinations have mostly resolved but unfortunately he is experiencing increased urinary retention. It appears this started when he started antipsychotics. He does report some chronic BPH symptoms but says this is worse. He denies any current suicidal or homicidal ideation, plan or intent.  Objective: Well developed well nourished man in no apparent distress pleasant and appropriate speech shows some mild hesitancy motor is unremarkable thought processes are linear and goal-directed thought content denies current suicidal or homicidal ideation, plan or intent or auditory hallucinations alert and oriented 3 insight and judgment are fair IQ appears an average range         Current Facility-Administered Medications (Analgesics):  .  acetaminophen (TYLENOL) tablet 650 mg .  naproxen (NAPROSYN) tablet 500 mg     Current Facility-Administered Medications (Other):  .  alum & mag hydroxide-simeth (MAALOX/MYLANTA) 200-200-20 MG/5ML suspension 30 mL .  citalopram (CELEXA) tablet 20 mg .  dicyclomine (BENTYL) tablet 20 mg .  hydrOXYzine (ATARAX/VISTARIL) tablet 25 mg .  loperamide (IMODIUM) capsule 2-4 mg .  magnesium hydroxide (MILK OF MAGNESIA) suspension 30 mL .   ondansetron (ZOFRAN-ODT) disintegrating tablet 4 mg  No current outpatient prescriptions on file.  Vital Signs:Blood pressure 113/73, pulse (!) 59, temperature 97.8 F (36.6 C), temperature source Oral, resp. rate 16, height 5\' 7"  (1.702 m), weight 66.2 kg (146 lb).    Lab Results: No results found for this or any previous visit (from the past 48 hour(s)).  Physical Findings: AIMS: Facial and Oral Movements Muscles of Facial Expression: None, normal Lips and Perioral Area: None, normal Jaw: None, normal Tongue: None, normal,Extremity Movements Upper (arms, wrists, hands, fingers): None, normal Lower (legs, knees, ankles, toes): None, normal, Trunk Movements Neck, shoulders, hips: None, normal, Overall Severity Severity of abnormal movements (highest score from questions above): None, normal Incapacitation due to abnormal movements: None, normal Patient's awareness of abnormal movements (rate only patient's report): No Awareness, Dental Status Current problems with teeth and/or dentures?: No Does patient usually wear dentures?: No  CIWA:  CIWA-Ar Total: 1 COWS:  COWS Total Score: 1   Assessment/Plan: Patient reports problems with urinary retention which may be related to the risperidone and Haldol. At this time we will have a trial of discontinuing the Haldol and observing if patient has a return of his hallucinations. It is possible that these might be related to anxiety or acute intoxication/withdrawal and he will not need an antipsychotic at this time. 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  Acquanetta SitElizabeth Woods Kiril Hippe, MD 01/15/2016, 2:15 PM

## 2016-01-15 NOTE — [facility] Group Notes (Signed)
[facility] LCSW Group Therapy  01/15/2016 3:37 PM  Type of Therapy:  Group Therapy  Participation Level:  Active  Participation Quality:  Attentive  Affect:  Appropriate  Cognitive:  Alert and Oriented  Insight:  Improving  Engagement in Therapy:  Improving  Modes of Intervention:  Discussion, Education, Exploration, Problem-solving, Rapport Building, Socialization and Support  Summary of Progress/Problems: Emotion Regulation: This group focused on both positive and negative emotion identification and allowed group members to process ways to identify feelings, regulate negative emotions, and find healthy ways to manage internal/external emotions. Group members were asked to reflect on a time when their reaction to an emotion led to a negative outcome and explored how alternative responses using emotion regulation would have benefited them. Group members were also asked to discuss a time when emotion regulation was utilized when a negative emotion was experienced. Peter Washington was attentive and engaged during today's processing group. He shared that he struggles with depression and has been practicing self affirmations. 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   Michon Kaczmarek N Smart LCSW 01/15/2016, 3:37 PM

## 2016-01-15 NOTE — Progress Notes (Signed)
[facility] Group Notes:  (Nursing/MHT/Case Management/Adjunct)  Date:  01/15/2016  Time:  2100  Type of Therapy:  wrap up group  Participation Level:  Active  Participation Quality:  Attentive, Sharing and Supportive  Affect:  Appropriate  Cognitive:  Lacking  Insight:  Improving  Engagement in Group:  Engaged  Modes of Intervention:  Clarification, Education and Support  Summary of Progress/Problems: Pt shared that he received good news about treatment referrals today. Pt reports wanting treatment for his illness and brain trauma.  Pt forgets what he has shared and tries to remember when asked for more detail. Pt then starts over and speaks again but it  is challenging for him to retrieve information. Pt is aware and reports that this is a symptom of his brain trauma.   Peter Washington, Peter Washington S 01/15/2016, 9:32 PM

## 2016-01-15 NOTE — Progress Notes (Signed)
Patient ID: Melton Krebslvin Strum, male   DOB: 10/14/1965, 50 y.o.   MRN: [id number]  Pt currently presents with a blunted affect and depressed behavior. Pt reports to writer that their goal is to "get some sleep." Pt reports that although he falls asleep, he wakes up 3-4 times a night. Pt states "If I stay around people the voices aren't as noticeable." Pt reports that he currently has limited support at home but would like to attend and gain support through NA meetings. Pt sees his brain injuries as an obstacle to maintaining a functioning life and keeping friends. Pt shows insight, limited self esteem.  Pt provided with medications per providers orders. Pt's labs and vitals were monitored throughout the night. Pt supported emotionally and encouraged to express concerns and questions. Pt given a 1:1 about concerns of support and functioning at home. Pt educated on medications, assertiveness techniques.  Pt's safety ensured with 15 minute and environmental checks. Pt endorses AH. Pt currently denies SI/HI and visual hallucinations. Pt verbally agrees to seek staff if SI/HI or VH occurs and to consult with staff before acting on any harmful thoughts. Will continue POC.

## 2016-01-16 MED ORDER — TAMSULOSIN HCL 0.4 MG PO CAPS
0.4000 mg | ORAL_CAPSULE | Freq: Every day | ORAL | Status: DC
  Administered 2016-01-16 – 2016-01-19 (×4): 0.4 mg via ORAL
  Filled 2016-01-16 (×5): qty 1

## 2016-01-16 MED ORDER — ARIPIPRAZOLE 5 MG PO TABS
5.0000 mg | ORAL_TABLET | Freq: Every day | ORAL | Status: DC
  Administered 2016-01-16: 5 mg via ORAL
  Filled 2016-01-16 (×3): qty 1

## 2016-01-16 NOTE — Progress Notes (Signed)
Patient ID: Peter Washington, male   DOB: 05/04/1965, 50 y.o.   MRN: [id number]  Pt currently presents with a depressed affect and submissive behavior. Pt reports to writer that their goal is to "go to groups." Pt reports poor sleep with current medication regimen. Pt reports some dizziness that he attributes to the Trazadone, refuses second dose.   Pt provided with medications per providers orders. Pt's labs and vitals were monitored throughout the night. Pt supported emotionally and encouraged to express concerns and questions. Pt educated on medications.  Pt's safety ensured with 15 minute and environmental checks. Pt currently denies SI/HI, endorses baseline A/V hallucinations. Pt verbally agrees to seek staff if SI/HI and to consult with staff before acting on any harmful thoughts. Will continue POC.

## 2016-01-16 NOTE — Progress Notes (Signed)
Recreation Therapy Notes  Date: 01/16/16 Time: 0930 Location: [address] Group Room  Group Topic: Stress Management  Goal Area(s) Addresses:  Patient will verbalize importance of using healthy stress management.  Patient will identify positive emotions associated with healthy stress management.   Intervention: Stress Management  Activity :  Progressive Muscle Relaxation. LRT introduced that stress management technique of progressive muscle relaxation.  LRT read a script to allow patients to engage in the activity.  Patients were to follow along as LRT read script.  Education:  Stress Management, Discharge Planning.   Education Outcome: Acknowledges edcuation/In group clarification offered/Needs additional education  Clinical Observations/Feedback: Pt did not attend group.   Caysen Whang, LRT/CTRS         Clifford Benninger A 01/16/2016 11:57 AM 

## 2016-01-16 NOTE — Progress Notes (Signed)
[facility]  01/16/2016 1:25 PM  Patient Active Problem List   Diagnosis Date Noted  . 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 Polysubstance (including opioids) dependence with physiol dependence (HCC) 01/11/2016  . Hallucinosis 01/11/2016  . Pulmonary emboli (HCC) 01/16/2014  . Tobacco use 01/16/2014  . Multiple pulmonary emboli (HCC) 01/16/2014  . Protein C deficiency (HCC) 12/31/2013  . Acute DVT (deep venous thrombosis) (HCC) 12/29/2013  . Pleuritic chest pain 12/29/2013  . Dyspnea 12/29/2013  . Chest pain 12/29/2013    Diagnosis: Poly-substance use disorder and depression and possibly CTE  Subjective: Patient reports that he does not want to come off the antipsychotics as he still experiencing bothersome voices. He reports that especially when he is by himself they are louder and more intrusive. We discussed various alternatives risks and benefits of antipsychotic medications including things such as tardive dyskinesia and cognitive dulling. After discussion of the alternatives patient agrees to a trial of aripiprazole 5 mg by mouth 2 at bedtime. He denies acute suicidal or homicidal ideation, plan or intent. We discussed follow-up for his BPH symptoms. Patient was educated that I am not trained to adequately assess prostate problems. He can have an impaired trial of Flomax and can have a PSA checked but he should follow up with a specialist after discharge.  Objective: Well-developed well-nourished man in no apparent distress pleasant and cooperative neatly dressed and groomed mood is described as all right affect is congruent speech and motor within normal limits thought processes linear and goal-directed thought content endorses ongoing auditory hallucinations which are bothersome at times denies any acute suicidal or homicidal ideation, plan or intent alert and oriented 3 IQ appears an average range insight and judgment are  fair         Current Facility-Administered Medications (Analgesics):  .  acetaminophen (TYLENOL) tablet 650 mg .  naproxen (NAPROSYN) tablet 500 mg     Current Facility-Administered Medications (Other):  .  alum & mag hydroxide-simeth (MAALOX/MYLANTA) 200-200-20 MG/5ML suspension 30 mL .  ARIPiprazole (ABILIFY) tablet 5 mg .  citalopram (CELEXA) tablet 20 mg .  dicyclomine (BENTYL) tablet 20 mg .  hydrOXYzine (ATARAX/VISTARIL) tablet 25 mg .  loperamide (IMODIUM) capsule 2-4 mg .  magnesium hydroxide (MILK OF MAGNESIA) suspension 30 mL .  ondansetron (ZOFRAN-ODT) disintegrating tablet 4 mg .  tamsulosin (FLOMAX) capsule 0.4 mg .  traZODone (DESYREL) tablet 50 mg  No current outpatient prescriptions on file.  Vital Signs:Blood pressure 115/68, pulse 61, temperature 98 F (36.7 C), temperature source Oral, resp. rate 18, height 5\' 7"  (1.702 m), weight 66.2 kg (146 lb).    Lab Results: No results found for this or any previous visit (from the past 48 hour(s)).  Physical Findings: AIMS: Facial and Oral Movements Muscles of Facial Expression: None, normal Lips and Perioral Area: None, normal Jaw: None, normal Tongue: None, normal,Extremity Movements Upper (arms, wrists, hands, fingers): None, normal Lower (legs, knees, ankles, toes): None, normal, Trunk Movements Neck, shoulders, hips: None, normal, Overall Severity Severity of abnormal movements (highest score from questions above): None, normal Incapacitation due to abnormal movements: None, normal Patient's awareness of abnormal movements (rate only patient's report): No Awareness, Dental Status Current problems with teeth and/or dentures?: No Does patient usually wear dentures?: No  CIWA:  CIWA-Ar Total: 1 COWS:  COWS Total Score: 1   Assessment/Plan: Patient will have trial of aripiprazole to attempt to balance need to control auditory hallucinations would need  to avoid too many urinary symptoms and cognitive  dulling. Patient appears to be rather comfortable in his stay here and will need to move onto the next level of care as an outpatient early next week.  Acquanetta SitElizabeth Woods Ayaan Ringle, MD 01/16/2016, 1:25 PM

## 2016-01-16 NOTE — Progress Notes (Signed)
Peter Washington is a little gruff, irritable this morning. Peter Washington makes little to no eye contact. A Peter Washington completed his daily assessment and on it Peter Washington wrote Peter Washington deneis SI today and Peter Washington rated his depression, hopelessness anda xneity " 5/9/3", res[pectively.A Peter Washington is started on flomax today...for c/o urinary retention. Peter Washington deneis active hallucinations..Office Spirometry Results:   any kinds. Peter Washington is seen watching TV in the dayroom most of the day. Peter Washington demonstrates minimal engagement into his recovery and says " I just want to get out of here". R Safety maintained. Writer encourages patient to " stay open to the message" and be honest with his feelings.

## 2016-01-16 NOTE — Progress Notes (Signed)
CSW contacted [facility] today--no beds available. Pt continues to be on waitlist. Pt has [facility] screening for possible admission on Monday November 13th at 7:45AM.  Trula SladeHeather Smart, MSW, LCSW Clinical Social Worker 01/16/2016 12:13 PM

## 2016-01-16 NOTE — Tx Team (Signed)
Interdisciplinary Treatment and Diagnostic Plan Update  01/16/2016 Time of Session: 9:30AM Melton Krebslvin Wiesen MRN: [id number]  Principal Diagnosis: Hallucinosis  Secondary Diagnoses: Principal Problem:   Hallucinosis   Current Medications:  Current Facility-Administered Medications  Medication Dose Route Frequency Provider Last Rate Last Dose  . acetaminophen (TYLENOL) tablet 650 mg  650 mg Oral Q6H PRN Charm RingsJamison Y Lord, NP   650 mg at 01/14/16 0755  . alum & mag hydroxide-simeth (MAALOX/MYLANTA) 200-200-20 MG/5ML suspension 30 mL  30 mL Oral Q4H PRN Charm RingsJamison Y Lord, NP      . ARIPiprazole (ABILIFY) tablet 5 mg  5 mg Oral QHS Acquanetta SitElizabeth Woods Oates, MD      . citalopram (CELEXA) tablet 20 mg  20 mg Oral QHS Acquanetta SitElizabeth Woods Oates, MD   20 mg at 01/15/16 2315  . dicyclomine (BENTYL) tablet 20 mg  20 mg Oral Q6H PRN Charm RingsJamison Y Lord, NP      . hydrOXYzine (ATARAX/VISTARIL) tablet 25 mg  25 mg Oral Q6H PRN Charm RingsJamison Y Lord, NP   25 mg at 01/13/16 2239  . loperamide (IMODIUM) capsule 2-4 mg  2-4 mg Oral PRN Charm RingsJamison Y Lord, NP      . magnesium hydroxide (MILK OF MAGNESIA) suspension 30 mL  30 mL Oral Daily PRN Charm RingsJamison Y Lord, NP      . naproxen (NAPROSYN) tablet 500 mg  500 mg Oral BID PRN Charm RingsJamison Y Lord, NP      . ondansetron (ZOFRAN-ODT) disintegrating tablet 4 mg  4 mg Oral Q6H PRN Charm RingsJamison Y Lord, NP      . tamsulosin (FLOMAX) capsule 0.4 mg  0.4 mg Oral QPC supper Acquanetta SitElizabeth Woods Oates, MD      . traZODone (DESYREL) tablet 50 mg  50 mg Oral QHS,MR X 1 Jackelyn PolingJason A Berry, NP   50 mg at 01/15/16 2341   PTA Medications: Prescriptions Prior to Admission  Medication Sig Dispense Refill Last Dose  . acetaminophen (TYLENOL) 325 MG tablet Take 2 tablets (650 mg total) by mouth every 6 (six) hours as needed for fever. (Patient not taking: Reported on 01/10/2016) 30 tablet 0 Not Taking at Unknown time  . dicyclomine (BENTYL) 20 MG tablet Take 1 tablet (20 mg total) by mouth 2 (two) times daily. 20 tablet 0   .  omeprazole (PRILOSEC) 20 MG capsule Take 1 capsule (20 mg total) by mouth daily. 30 capsule 0   . ondansetron (ZOFRAN) 4 MG tablet Take 1 tablet (4 mg total) by mouth every 8 (eight) hours as needed for nausea or vomiting. (Patient not taking: Reported on 01/10/2016) 20 tablet 0 Not Taking at Unknown time    Patient Stressors: Financial difficulties Loss of significant relationship Occupational concerns Substance abuse  Patient Strengths: Capable of independent living Motivation for treatment/growth Supportive family/friends  Treatment Modalities: Medication Management, Group therapy, Case management,  1 to 1 session with clinician, Psychoeducation, Recreational therapy.   Physician Treatment Plan for Primary Diagnosis: Hallucinosis Long Term Goal(s): Improvement in symptoms so as ready for discharge Improvement in symptoms so as ready for discharge   Short Term Goals: Ability to verbalize feelings will improve Ability to disclose and discuss suicidal ideas Ability to identify changes in lifestyle to reduce recurrence of condition will improve Ability to identify and develop effective coping behaviors will improve Compliance with prescribed medications will improve  Medication Management: Evaluate patient's response, side effects, and tolerance of medication regimen.  Therapeutic Interventions: 1 to 1 sessions, Unit Group sessions and Medication administration.  Evaluation of Outcomes: Progressing  Physician Treatment Plan for Secondary Diagnosis: Principal Problem:   Hallucinosis  Long Term Goal(s): Improvement in symptoms so as ready for discharge Improvement in symptoms so as ready for discharge   Short Term Goals: Ability to verbalize feelings will improve Ability to disclose and discuss suicidal ideas Ability to identify changes in lifestyle to reduce recurrence of condition will improve Ability to identify and develop effective coping behaviors will improve Compliance  with prescribed medications will improve     Medication Management: Evaluate patient's response, side effects, and tolerance of medication regimen.  Therapeutic Interventions: 1 to 1 sessions, Unit Group sessions and Medication administration.  Evaluation of Outcomes: Progressing   RN Treatment Plan for Primary Diagnosis: Hallucinosis Long Term Goal(s): Knowledge of disease and therapeutic regimen to maintain health will improve  Short Term Goals: Ability to remain free from injury will improve, Ability to disclose and discuss suicidal ideas and Ability to identify and develop effective coping behaviors will improve  Medication Management: RN will administer medications as ordered by provider, will assess and evaluate patient's response and provide education to patient for prescribed medication. RN will report any adverse and/or side effects to prescribing provider.  Therapeutic Interventions: 1 on 1 counseling sessions, Psychoeducation, Medication administration, Evaluate responses to treatment, Monitor vital signs and CBGs as ordered, Perform/monitor CIWA, COWS, AIMS and Fall Risk screenings as ordered, Perform wound care treatments as ordered.  Evaluation of Outcomes: Progressing   LCSW Treatment Plan for Primary Diagnosis: Hallucinosis Long Term Goal(s): Safe transition to appropriate next level of care at discharge, Engage patient in therapeutic group addressing interpersonal concerns.  Short Term Goals: Engage patient in aftercare planning with referrals and resources, Facilitate patient progression through stages of change regarding substance use diagnoses and concerns and Identify triggers associated with mental health/substance abuse issues  Therapeutic Interventions: Assess for all discharge needs, 1 to 1 time with Social worker, Explore available resources and support systems, Assess for adequacy in community support network, Educate family and significant other(s) on suicide  prevention, Complete Psychosocial Assessment, Interpersonal group therapy.  Evaluation of Outcomes: Progressing   Progress in Treatment: Attending groups: Yes Participating in groups: Yes Taking medication as prescribed: Yes. Toleration medication: Yes. Family/Significant other contact made: SPE completed with pt; pt declined to consent to family contact.  Patient understands diagnosis: Yes. Discussing patient identified problems/goals with staff: Yes. Medical problems stabilized or resolved: Yes. Denies suicidal/homicidal ideation: Yes-self report.  Issues/concerns per patient self-inventory: Yes Other: pt reporting AVH/appears to be poor historian regarding his substance use/abuse.  New problem(s) identified: Yes, Describe:  poor historian-inconsistencies with substance use reported by patient and labs  New Short Term/Long Term Goal(s): medication stabilization, elimination of AVH; detox.   Discharge Plan or Barriers: Pt has been referred to [facility] and [facility]-scheduled screening for Nov 13th at 7:45AM. Pt has also been given [employer], Production assistant, radio, and Location manager and encouraged to call and check openings.   Reason for Continuation of Hospitalization: Depression Hallucinations Medication stabilization Withdrawal symptoms  Estimated Length of Stay: 2-4 days (not a weekend discharge). Pt is hopeful for [facility] bed.   Attendees: Patient: 01/16/2016 11:56 AM  Physician: Dr. Mckinley Jewel MD 01/16/2016 11:56 AM  Nursing: Kandis Mannan RN 01/16/2016 11:56 AM  RN Care Manager: Onnie Boer CM 01/16/2016 11:56 AM  Social Worker: [name], LCSW 01/16/2016 11:56 AM  Recreational Therapist:  01/16/2016 11:56 AM  Other:  01/16/2016 11:56 AM  Other:  01/16/2016 11:56 AM  Other: 01/16/2016 11:56 AM    Scribe for Treatment Team: Ledell PeoplesHeather N Smart, LCSW 01/16/2016 11:56 AM

## 2016-01-16 NOTE — Progress Notes (Signed)
Pt attended the evening AA speaker meeting. Pt was engaged and participated. 01/16/16 10:01 PM Peter Washington, NT 

## 2016-01-17 LAB — PSA: PSA: 2.13 ng/mL (ref 0.00–4.00)

## 2016-01-17 MED ORDER — QUETIAPINE FUMARATE 25 MG PO TABS
25.0000 mg | ORAL_TABLET | Freq: Every day | ORAL | Status: DC
  Filled 2016-01-17: qty 1

## 2016-01-17 MED ORDER — TRAZODONE HCL 100 MG PO TABS
100.0000 mg | ORAL_TABLET | Freq: Every day | ORAL | Status: DC
  Administered 2016-01-17 – 2016-01-19 (×3): 100 mg via ORAL
  Filled 2016-01-17 (×4): qty 1

## 2016-01-17 MED ORDER — ARIPIPRAZOLE 5 MG PO TABS
5.0000 mg | ORAL_TABLET | Freq: Every day | ORAL | Status: DC
  Administered 2016-01-17 – 2016-01-20 (×4): 5 mg via ORAL
  Filled 2016-01-17 (×5): qty 1

## 2016-01-17 NOTE — Progress Notes (Signed)
[facility]  01/17/2016 11:12 AM Peter Krebslvin Mellott  MRN:  [id number] Subjective:  Patient was in his room asleep.  He states that he had a hard time staying asleep and that his moods get affected that lack of rest.  He states that moods have improved by some.  No thoughts of suicide.  Patient states that he has a substance abuse problem.  His lab work does verify positive Opiates, Cocaine and THC.    Principal Problem: Hallucinosis Diagnosis:   Patient Active Problem List   Diagnosis Date Noted  . Major depressive disorder, recurrent severe without psychotic features (HCC) [F33.2] 01/12/2016  . Polysubstance (including opioids) dependence with physiol dependence (HCC) [F19.20] 01/11/2016  . 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 Pulmonary emboli (HCC) [I26.99] 01/16/2014  . Tobacco use [Z72.0] 01/16/2014  . Multiple pulmonary emboli (HCC) [I26.99] 01/16/2014  . Protein C deficiency (HCC) [D68.59] 12/31/2013  . Acute DVT (deep venous thrombosis) (HCC) [I82.409] 12/29/2013  . Pleuritic chest pain [R07.81] 12/29/2013  . Dyspnea [R06.00] 12/29/2013  . Chest pain [R07.9] 12/29/2013   Total Time spent with patient: 30 minutes  Past Psychiatric History:  See HPI  Past Medical History:  Past Medical History:  Diagnosis Date  . DVT of lower extremity (deep venous thrombosis) (HCC)   . PE (pulmonary embolism)    History reviewed. No pertinent surgical history. Family History: History reviewed. No pertinent family history. Family Psychiatric  History: see HPi Social History:  History  Alcohol Use  . Yes     History  Drug Use No    Social History   Social History  . Marital status: Single    Spouse name: N/A  . Number of children: N/A  . Years of education: N/A   Social History Main Topics  . Smoking status: Light Tobacco Smoker    Packs/day: 0.00    Types: Cigarettes  . Smokeless tobacco: Never Used  . Alcohol use Yes  . Drug use: No  . Sexual activity: Not Asked   Other  Topics Concern  . None   Social History Narrative  . None   Additional Social History:                         Sleep: Good  Appetite:  Good  Current Medications: Current Facility-Administered Medications  Medication Dose Route Frequency Provider Last Rate Last Dose  . acetaminophen (TYLENOL) tablet 650 mg  650 mg Oral Q6H PRN Charm RingsJamison Y Lord, NP   650 mg at 01/14/16 0755  . alum & mag hydroxide-simeth (MAALOX/MYLANTA) 200-200-20 MG/5ML suspension 30 mL  30 mL Oral Q4H PRN Charm RingsJamison Y Lord, NP      . ARIPiprazole (ABILIFY) tablet 5 mg  5 mg Oral QHS Acquanetta SitElizabeth Woods Oates, MD   5 mg at 01/16/16 2311  . citalopram (CELEXA) tablet 20 mg  20 mg Oral QHS Acquanetta SitElizabeth Woods Oates, MD   20 mg at 01/16/16 2311  . magnesium hydroxide (MILK OF MAGNESIA) suspension 30 mL  30 mL Oral Daily PRN Charm RingsJamison Y Lord, NP      . tamsulosin (FLOMAX) capsule 0.4 mg  0.4 mg Oral QPC supper Acquanetta SitElizabeth Woods Oates, MD   0.4 mg at 01/16/16 1724  . traZODone (DESYREL) tablet 50 mg  50 mg Oral QHS,MR X 1 Jackelyn PolingJason A Berry, NP   50 mg at 01/16/16 2311    Lab Results:  Results for orders placed or performed during the hospital encounter of 01/12/16 (  from the past 48 hour(s))  PSA     Status: None   Collection Time: 01/17/16  6:30 AM  Result Value Ref Range   PSA 2.13 0.00 - 4.00 ng/mL    Comment: (NOTE) While PSA levels of <=4.0 ng/ml are reported as reference range, some men with levels below 4.0 ng/ml can have prostate cancer and many men with PSA above 4.0 ng/ml do not have prostate cancer.  Other tests such as free PSA, age specific reference ranges, PSA velocity and PSA doubling time may be helpful especially in men less than 73 years old. Performed at [facility]     Blood Alcohol level:  Lab Results  Component Value Date   [facility] <5 01/10/2016   ETH <11 03/04/2014    Metabolic Disorder Labs: Lab Results  Component Value Date   HGBA1C 5.5 01/13/2016   MPG 111 01/13/2016   No results  found for: PROLACTIN Lab Results  Component Value Date   CHOL 178 01/13/2016   TRIG 77 01/13/2016   HDL 95 01/13/2016   CHOLHDL 1.9 01/13/2016   VLDL 15 01/13/2016   LDLCALC 68 01/13/2016    Physical Findings: AIMS: Facial and Oral Movements Muscles of Facial Expression: None, normal Lips and Perioral Area: None, normal Jaw: None, normal Tongue: None, normal,Extremity Movements Upper (arms, wrists, hands, fingers): None, normal Lower (legs, knees, ankles, toes): None, normal, Trunk Movements Neck, shoulders, hips: None, normal, Overall Severity Severity of abnormal movements (highest score from questions above): None, normal Incapacitation due to abnormal movements: None, normal Patient's awareness of abnormal movements (rate only patient's report): No Awareness, Dental Status Current problems with teeth and/or dentures?: No Does patient usually wear dentures?: No  CIWA:  CIWA-Ar Total: 1 COWS:  COWS Total Score: 0  Musculoskeletal: Strength & Muscle Tone: within normal limits Gait & Station: normal Patient leans: N/A  Psychiatric Specialty Exam: Physical Exam  Nursing note and vitals reviewed. Psychiatric: He has a normal mood and affect. His behavior is normal. Judgment and thought content normal.    Review of Systems  Constitutional: Negative.   HENT: Negative.   Eyes: Negative.   Respiratory: Negative.   Cardiovascular: Negative.   Gastrointestinal: Negative.   Genitourinary: Negative.   Musculoskeletal: Negative.   Skin: Negative.   Neurological: Negative.   Endo/Heme/Allergies: Negative.   Psychiatric/Behavioral: Negative.     Blood pressure 103/72, pulse 64, temperature 97.7 F (36.5 C), temperature source Oral, resp. rate 16, height 5\' 7"  (1.702 m), weight 66.2 kg (146 lb).Body mass index is 22.87 kg/m.  General Appearance: Disheveled  Eye Contact:  Fair  Speech:  Normal Rate  Volume:  Normal  Mood:  Depressed  Affect:  Blunt and Congruent  Thought  Process:  Coherent  Orientation:  Full (Time, Place, and Person)  Thought Content:  Rumination  Suicidal Thoughts:  No  Homicidal Thoughts:  No  Memory:  Immediate;   Fair Recent;   Fair Remote;   Fair  Judgement:  Intact  Insight:  Fair  Psychomotor Activity:  Normal  Concentration:  Concentration: Fair and Attention Span: Fair  Recall:  [employer] of Knowledge:  Fair  Language:  Fair  Akathisia:  Negative  Handed:  Right  AIMS (if indicated):     Assets:  Resilience Social Support  ADL's:  Intact  Cognition:  WNL  Sleep:  Number of Hours: 3.5   Treatment Plan Summary: Review of chart, vital signs, medications, and notes.  1-Individual and group therapy  2-Medication management for depression and anxiety: Medications reviewed with the patient.  Cont. Abilify 5 mg daily mood stabilization.  Celexa 20 mg depression,  Increased Trazodone to 100 mg QHS insomnia 3-Coping skills for depression, anxiety  4-Continue crisis stabilization and management  5-Address health issues--monitoring vital signs, stable  6-Treatment plan in progress to prevent relapse of depression and anxiety  Lindwood QuaSheila May Jozlynn Plaia, NP [facility] 01/17/2016, 11:12 AM

## 2016-01-17 NOTE — Progress Notes (Signed)
D) Pt has attended the groups and participated in them. Affect is flat but brightens with interaction. Pt rates his depression at a 4, hopelessness and anxiety at a 1. Denies SI and HI. A) Pt given support and provided with a 1:1. Talked about his brain injury and how that has affected his life. States he uses drugs to cope. DR) Given support, reassurance and praise.

## 2016-01-17 NOTE — [facility] Group Notes (Signed)
Late entry from 01/16/16:      [facility]  1:15 PM Type of Therapy: Group Therapy Participation Level: Active  Participation Quality: Attentive, sharing, supportive  Affect: Appropriate  Cognitive: Alert and Oriented  Insight: Developing/Improving and Engaged  Engagement in Therapy: Developing/Improving and Engaged  Modes of Intervention: Clarification, Confrontation, Discussion, Education, Exploration, Limit-setting, Orientation, Problem-solving, Rapport Building, Dance movement psychotherapisteality Testing, Socialization and Support  Summary of Progress/Problems: The topic for today was feelings about relapse. Pt discussed what relapse prevention is to them and identified triggers that they are on the path to relapse. Pt processed their feeling towards relapse and was able to relate to peers. Pt discussed coping skills that can be used for relapse prevention. Patient discussed his triggers for relapse and shared that he finds deception or manipulation a problematic behavior for himself. 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   Samuella BruinKristin Ranell Finelli, MSW, LCSW Clinical Social Worker [facility] [phone]

## 2016-01-17 NOTE — [facility] Group Notes (Signed)
[facility] Group Notes: (Clinical Social Work)   01/17/2016      Type of Therapy:  Group Therapy   Participation Level:  Did Not Attend despite MHT prompting   Madoc Holquin Grossman-Orr, LCSW 01/17/2016, 12:52 PM     

## 2016-01-17 NOTE — Progress Notes (Signed)
Pt attended the evening AA speaker meeting. Pt was engaged.  Haydyn Girvan C, NT 01/17/16 8:28 PM  

## 2016-01-17 NOTE — Plan of Care (Signed)
Problem: Safety: Goal: Periods of time without injury will increase Outcome: Progressing Pt. denies SI/HI, remians a low fall risk, unsteady gait due to blurry vision, Q 15 checks in place.

## 2016-01-17 NOTE — Progress Notes (Signed)
D: Pt. is up and visible in the milieu, seen watching TV alone. Denies having any SI/HI/AH Pain at this time. Pt. endorses VI and states "I see happy people. Pt. presents with an animated and pleasant affect and mood. Pt. was cooperative and calm with Clinical research associatewriter during interaction. Pt. remains unsteady due to blurry vision.   A: Encouragement and support given. Meds. ordered and given.   R: 1:1 interaction in private. Safety maintained with Q 15 checks. Continues to follow treatment plan and will monitor closely. No questions/concerns at this time.

## 2016-01-18 DIAGNOSIS — F17210 Nicotine dependence, cigarettes, uncomplicated: Secondary | ICD-10-CM

## 2016-01-18 DIAGNOSIS — F28 Other psychotic disorder not due to a substance or known physiological condition: Secondary | ICD-10-CM

## 2016-01-18 DIAGNOSIS — F19951 Other psychoactive substance use, unspecified with psychoactive substance-induced psychotic disorder with hallucinations: Secondary | ICD-10-CM | POA: Clinically undetermined

## 2016-01-18 DIAGNOSIS — Z79899 Other long term (current) drug therapy: Secondary | ICD-10-CM

## 2016-01-18 LAB — URINALYSIS, ROUTINE W REFLEX MICROSCOPIC
BILIRUBIN URINE: NEGATIVE
Glucose, UA: NEGATIVE mg/dL
Hgb urine dipstick: NEGATIVE
KETONES UR: NEGATIVE mg/dL
LEUKOCYTES UA: NEGATIVE
NITRITE: NEGATIVE
PH: 7 (ref 5.0–8.0)
Protein, ur: NEGATIVE mg/dL
SPECIFIC GRAVITY, URINE: 1.011 (ref 1.005–1.030)

## 2016-01-18 NOTE — Progress Notes (Signed)
[facility]  01/18/2016 1:47 PM Peter Washington  MRN:  [id number]   Subjective: Patient reports " I am doing okay, I wasn't able to sleep last night."  Objective:Peter Washington is awake, alert and oriented X4. Seen resting in bedroom.  Denies suicidal or homicidal ideation. Denies auditory or visual hallucination and does not appear to be responding to internal stimuli. Patient appears irritable with questions. Patient answering question with "yes and no" responses.   Patient reports he is medication compliant, states he is tolerating medication well. Patient denies depression. Patient reports he was up all night. Support, encouragement and reassurance was provided.   Principal Problem: Substance-induced psychotic disorder with hallucinations (HCC) Diagnosis:   Patient Active Problem List   Diagnosis Date Noted  . Substance-induced psychotic disorder with hallucinations (HCC) [F19.951] 01/18/2016  . Major depressive disorder, recurrent severe without psychotic features (HCC) [F33.2] 01/12/2016  . Polysubstance (including opioids) dependence with physiol dependence (HCC) [F19.20] 01/11/2016  . Pulmonary emboli (HCC) [I26.99] 01/16/2014  . Tobacco use [Z72.0] 01/16/2014  . Multiple pulmonary emboli (HCC) [I26.99] 01/16/2014  . Protein C deficiency (HCC) [D68.59] 12/31/2013  . Acute DVT (deep venous thrombosis) (HCC) [I82.409] 12/29/2013  . Pleuritic chest pain [R07.81] 12/29/2013  . Dyspnea [R06.00] 12/29/2013  . Chest pain [R07.9] 12/29/2013   Total Time spent with patient: 30 minutes  Past Psychiatric History:  See HPI  Past Medical History:  Past Medical History:  Diagnosis Date  . DVT of lower extremity (deep venous thrombosis) (HCC)   . PE (pulmonary embolism)    History reviewed. No pertinent surgical history. Family History: History reviewed. No pertinent family history. Family Psychiatric  History: see HPi Social History:  History  Alcohol Use  . Yes     History   Drug Use No    Social History   Social History  . Marital status: Single    Spouse name: N/A  . Number of children: N/A  . Years of education: N/A   Social History Main Topics  . Smoking status: Light Tobacco Smoker    Packs/day: 0.00    Types: Cigarettes  . Smokeless tobacco: Never Used  . Alcohol use Yes  . Drug use: No  . Sexual activity: Not Asked   Other Topics Concern  . None   Social History Narrative  . None   Additional Social History:                         Sleep: Fair  Appetite:  Good  Current Medications: Current Facility-Administered Medications  Medication Dose Route Frequency Provider Last Rate Last Dose  . acetaminophen (TYLENOL) tablet 650 mg  650 mg Oral Q6H PRN Charm RingsJamison Y Lord, NP   650 mg at 01/14/16 0755  . alum & mag hydroxide-simeth (MAALOX/MYLANTA) 200-200-20 MG/5ML suspension 30 mL  30 mL Oral Q4H PRN Charm RingsJamison Y Lord, NP      . ARIPiprazole (ABILIFY) tablet 5 mg  5 mg Oral Daily Adonis BrookSheila Agustin, NP   5 mg at 01/18/16 1017  . citalopram (CELEXA) tablet 20 mg  20 mg Oral QHS Acquanetta SitElizabeth Woods Oates, MD   20 mg at 01/17/16 2235  . magnesium hydroxide (MILK OF MAGNESIA) suspension 30 mL  30 mL Oral Daily PRN Charm RingsJamison Y Lord, NP      . tamsulosin [facility](FLOMAX) capsule 0.4 mg  0.4 mg Oral QPC supper Acquanetta SitElizabeth Woods Oates, MD   0.4 mg at 01/17/16 1704  . traZODone (DESYREL) tablet  100 mg  100 mg Oral QHS Adonis BrookSheila Agustin, NP   100 mg at 01/17/16 2235    Lab Results:  Results for orders placed or performed during the hospital encounter of 01/12/16 (from the past 48 hour(s))  PSA     Status: None   Collection Time: 01/17/16  6:30 AM  Result Value Ref Range   PSA 2.13 0.00 - 4.00 ng/mL    Comment: (NOTE) While PSA levels of <=4.0 ng/ml are reported as reference range, some men with levels below 4.0 ng/ml can have prostate cancer and many men with PSA above 4.0 ng/ml do not have prostate cancer.  Other tests such as free PSA, age specific reference  ranges, PSA velocity and PSA doubling time may be helpful especially in men less than 135 years old. [facility]     Blood Alcohol level:  Lab Results  Component Value Date   [facility] <5 01/10/2016   ETH <11 03/04/2014    Metabolic Disorder Labs: Lab Results  Component Value Date   HGBA1C 5.5 01/13/2016   MPG 111 01/13/2016   No results found for: PROLACTIN Lab Results  Component Value Date   CHOL 178 01/13/2016   TRIG 77 01/13/2016   HDL 95 01/13/2016   CHOLHDL 1.9 01/13/2016   VLDL 15 01/13/2016   LDLCALC 68 01/13/2016    Physical Findings: AIMS: Facial and Oral Movements Muscles of Facial Expression: None, normal Lips and Perioral Area: None, normal Jaw: None, normal Tongue: None, normal,Extremity Movements Upper (arms, wrists, hands, fingers): None, normal Lower (legs, knees, ankles, toes): None, normal, Trunk Movements Neck, shoulders, hips: None, normal, Overall Severity Severity of abnormal movements (highest score from questions above): None, normal Incapacitation due to abnormal movements: None, normal Patient's awareness of abnormal movements (rate only patient's report): No Awareness, Dental Status Current problems with teeth and/or dentures?: No Does patient usually wear dentures?: No  CIWA:  CIWA-Ar Total: 1 COWS:  COWS Total Score: 0  Musculoskeletal: Strength & Muscle Tone: within normal limits Gait & Station: normal Patient leans: N/A  Psychiatric Specialty Exam: Physical Exam  Nursing note and vitals reviewed. Constitutional: He appears well-developed and well-nourished.  Cardiovascular: Normal rate.   Neurological: He is alert.  Skin: Skin is warm.  Psychiatric: He has a normal mood and affect. His behavior is normal. Judgment and thought content normal.    Review of Systems  Psychiatric/Behavioral: Positive for depression and hallucinations. The patient is nervous/anxious and has insomnia.     Blood pressure 112/76,  pulse 69, temperature 97.4 F (36.3 C), resp. rate 16, height 5\' 7"  (1.702 m), weight 66.2 kg (146 lb).Body mass index is 22.87 kg/m.  General Appearance: Casual and Guarded  Eye Contact:  Fair  Speech:  Normal Rate  Volume:  Normal  Mood:  Depressed  Affect:  Blunt and Congruent  Thought Process:  Coherent  Orientation:  Full (Time, Place, and Person)  Thought Content:  Rumination  Suicidal Thoughts:  No  Homicidal Thoughts:  No  Memory:  Immediate;   Fair Recent;   Fair Remote;   Fair  Judgement:  Intact  Insight:  Fair  Psychomotor Activity:  Normal  Concentration:  Concentration: Fair and Attention Span: Fair  Recall:  F[employer]Fair  Fund of Knowledge:  Fair  Language:  Fair  Akathisia:  Negative  Handed:  Right  AIMS (if indicated):     Assets:  Resilience Social Support  ADL's:  Intact  Cognition:  WNL  Sleep:  Number of Hours: 6.25    I agree with current treatment plan on 01/18/2016, Patient seen face-to-face for psychiatric evaluation follow-up, chart reviewed. Reviewed the information documented and agree with the treatment plan.  Treatment Plan Summary: Review of chart, vital signs, medications, and notes.  1-Individual and group therapy  2-Medication management for depression and anxiety: Medications reviewed with the patient.  Continue. Abilify 5 mg daily mood stabilization.  Celexa 20 mg depression,  Urinary analysis/culture Abdominal doppler -(pending results) RN reports pt has c/o difficulties with urinating.  Increased Trazodone to 100 to 150 mg QHS insomnia Encouraged to stay awake during the day with group session participation. 3-Coping skills for depression, anxiety  4-Continue crisis stabilization and management  5-Address health issues--monitoring vital signs, stable  6-Treatment plan in progress to prevent relapse of depression and anxiety  Oneta Rack, NP 01/18/2016, 1:47 PM

## 2016-01-18 NOTE — Progress Notes (Signed)
D: Pt denies SI/HI. Pt is pleasant and cooperative. Pt goal for today is to get some sleep states up last night and didn't sleep well. A: Pt was offered support and encouragement. Pt was given scheduled medications. Pt was encourage to attend groups. Q 15 minute checks were done for safety.  R:Pt attends groups and interacts well with peers and staff. Pt is taking medication. Pt. receptive to treatment and safety maintained on unit.

## 2016-01-18 NOTE — Progress Notes (Signed)
Pt was in the dayroom watching TV when he overheard another peer using racial slurs in his conversation. Pt became upset and notified staff. Staff went to address the issue with everyone in the dayroom when this pt began to address the issue with the individual himself. Staff had to step in and redirect both patients. Pt was able to be verbally de-escalated.

## 2016-01-18 NOTE — [facility] Group Notes (Signed)
Adult Therapy Group Note  Date: 01/18/2016  Time:  10:00-11:00AM  Group Topic/Focus: Healthy Press photographerupport Systems   Building Self Esteem:   The focus of this group was to assist patients in identifying their current healthy supports as well as to list and discuss other supports that can be put in place to help with achieving life goals.  An activity allowed small groups to discuss different supports, their pros and cons, and how to get started with that type of support.  These items included supports such as 12-step groups, individual therapy, psychiatrists, children, faith activities, accountability partner, sponsor, group therapy, support groups, classes on mental health, phone apps/YouTube, gym/exercise, and more.  Songs were  played at the beginning and at the end of group with a short discussion about the emotions evoked as well as how to use music as an additional support/coping mechanism.   Participation Level:  Active  Participation Quality:  Attentive and Sharing  Affect:  Blunted and Depressed  Cognitive:  Appropriate  Insight: Improving  Engagement in Group:  Improving  Modes of Intervention:  Activity, Discussion and Support  Additional Comments:  The patient did not arrive in group until the last 20 minutes.  He listened quietly and appropriately.  At the end of group he made comments that seemed out of place, but did not pursue the topic.  Lynnell ChadMareida J Grossman-Orr, LCSW 01/18/2016  1:07 PM

## 2016-01-19 DIAGNOSIS — F1420 Cocaine dependence, uncomplicated: Secondary | ICD-10-CM

## 2016-01-19 MED ORDER — TAMSULOSIN HCL 0.4 MG PO CAPS
0.4000 mg | ORAL_CAPSULE | Freq: Every day | ORAL | 0 refills | Status: DC
Start: 2016-01-19 — End: 2016-02-07

## 2016-01-19 MED ORDER — ARIPIPRAZOLE 5 MG PO TABS: 5.0000 mg | ORAL_TABLET | Freq: Every day | ORAL | 0 refills | Status: DC

## 2016-01-19 MED ORDER — CITALOPRAM HYDROBROMIDE 20 MG PO TABS
20.0000 mg | ORAL_TABLET | Freq: Every day | ORAL | Status: DC
  Filled 2016-01-19: qty 21

## 2016-01-19 MED ORDER — TRAZODONE HCL 100 MG PO TABS: 100.0000 mg | ORAL_TABLET | Freq: Every day | ORAL | 0 refills | Status: DC

## 2016-01-19 MED ORDER — TAMSULOSIN HCL 0.4 MG PO CAPS
0.4000 mg | ORAL_CAPSULE | Freq: Every day | ORAL | Status: DC
  Filled 2016-01-19: qty 21

## 2016-01-19 MED ORDER — ARIPIPRAZOLE 5 MG PO TABS
5.0000 mg | ORAL_TABLET | Freq: Every day | ORAL | Status: DC
  Filled 2016-01-19: qty 21

## 2016-01-19 MED ORDER — TRAZODONE HCL 100 MG PO TABS
100.0000 mg | ORAL_TABLET | Freq: Every day | ORAL | Status: DC
  Filled 2016-01-19: qty 21

## 2016-01-19 MED ORDER — CITALOPRAM HYDROBROMIDE 20 MG PO TABS: 20.0000 mg | ORAL_TABLET | Freq: Every day | ORAL | 0 refills | Status: DC

## 2016-01-19 NOTE — Progress Notes (Signed)
Nurse reviewed patient's discharge information with him before dinner, AVS, SRA, etc.  Patient started to complain that his left foot was swollen.  Patient stated he fractured his left foot a long time ago, denied pain.  Patient stated you don't care.  Nurse informed charge nurse.  Another nurse talked with patient who was guarded and irritable.  Patient informed that MD will talk with patient tomorrow before discharge about his foot.

## 2016-01-19 NOTE — Progress Notes (Signed)
Pt has phone screening with [facility] at 11:00AM with Shayla in admissions for possible admission tomorrow morning.   Trula SladeHeather Smart, MSW, LCSW Clinical Social Worker 01/19/2016 11:06 AM

## 2016-01-19 NOTE — Discharge Summary (Signed)
Physician Discharge Summary Note  Patient:  Peter Washington is an 50 y.o., male MRN:  [id number] DOB:  [birth year] Patient phone:  [phone] (home)  Patient address:   [address] [place] [address],  Total Time spent with patient: 30 minutes  Date of Admission:  01/12/2016 Date of Discharge: 01/20/2016  Reason for Admission:   command hallucinations  Principal Problem: Cocaine dependence, continuous [facility]) Discharge Diagnoses: Patient Active Problem List   Diagnosis Date Noted  . Cocaine dependence, continuous (HCC) [F14.20] 01/19/2016  . Substance-induced psychotic disorder with hallucinations (HCC) [F19.951] 01/18/2016  . Major depressive disorder, recurrent severe without psychotic features (HCC) [F33.2] 01/12/2016  . Polysubstance (including opioids) dependence with physiol dependence (HCC) [F19.20] 01/11/2016  . Pulmonary emboli (HCC) [I26.99] 01/16/2014  . Tobacco use [Z72.0] 01/16/2014  . Multiple pulmonary emboli (HCC) [I26.99] 01/16/2014  . Protein C deficiency (HCC) [D68.59] 12/31/2013  . Acute DVT (deep venous thrombosis) (HCC) [I82.409] 12/29/2013  . Pleuritic chest pain [R07.81] 12/29/2013  . Dyspnea [R06.00] 12/29/2013  . Chest pain [R07.9] 12/29/2013    Past Psychiatric History: see HPI  Past Medical History:  Past Medical History:  Diagnosis Date  . DVT of lower extremity (deep venous thrombosis) (HCC)   . PE (pulmonary embolism)    History reviewed. No pertinent surgical history. Family History: History reviewed. No pertinent family history. Family Psychiatric  History: see HPI Social History:  History  Alcohol Use  . Yes     History  Drug Use No    Social History   Social History  . Marital status: Single    Spouse name: N/A  . Number of children: N/A  . Years of education: N/A   Social History Main Topics  . Smoking status: Light Tobacco Smoker    Packs/day: 0.00    Types: Cigarettes  . Smokeless tobacco: Never Used  . Alcohol  use Yes  . Drug use: No  . Sexual activity: Not Asked   Other Topics Concern  . None   Social History Narrative  . None    Hospital Course:  Quadre Bristol, 50 yo admitted to [facility] with c/o command hallucinations.  Patient has history of ETOH abuse depression and suicidal ideations.  He has one admission to [facility] noted in 2010.    Kieron Kantner was admitted for Cocaine dependence, continuous (HCC) and crisis management.  Patient was treated with medications with their indications listed below in detail under Medication List.  Medical problems were identified and treated as needed.  Home medications were restarted as appropriate.  Improvement was monitored by observation and Melton Krebs daily report of symptom reduction.  Emotional and mental status was monitored by daily self inventory reports completed by Melton Krebs and clinical staff.  Patient reported continued improvement, denied any new concerns.  Patient had been compliant on medications and denied side effects.  Support and encouragement was provided.    Patient encouraged to attend groups to help with recognizing triggers of emotional crises and de-stabilizations.  Patient encouraged to attend group to help identify the positive things in life that would help in dealing with feelings of loss, depression and unhealthy or abusive tendencies.         Halo Laski was evaluated by the treatment team for stability and plans for continued recovery upon discharge.  Patient was offered further treatment options upon discharge including Residential, Intensive Outpatient and Outpatient treatment. Patient will follow up with agency listed below for medication management and counseling.  Encouraged  patient to maintain satisfactory support network and home environment.  Advised to adhere to medication compliance and outpatient treatment follow up.  Prescriptions provided.       Melton Krebslvin Grudzinski motivation was an integral factor for scheduling further treatment.   Employment, transportation, bed availability, health status, family support, and any pending legal issues were also considered during patient's hospital stay.  Upon completion of this admission the patient was both mentally and medically stable for discharge denying suicidal/homicidal ideation, auditory/visual/tactile hallucinations, delusional thoughts and paranoia.      Physical Findings: AIMS: Facial and Oral Movements Muscles of Facial Expression: None, normal Lips and Perioral Area: None, normal Jaw: None, normal Tongue: None, normal,Extremity Movements Upper (arms, wrists, hands, fingers): None, normal Lower (legs, knees, ankles, toes): None, normal, Trunk Movements Neck, shoulders, hips: None, normal, Overall Severity Severity of abnormal movements (highest score from questions above): None, normal Incapacitation due to abnormal movements: None, normal Patient's awareness of abnormal movements (rate only patient's report): No Awareness, Dental Status Current problems with teeth and/or dentures?: No Does patient usually wear dentures?: No  CIWA:  CIWA-Ar Total: 1 COWS:  COWS Total Score: 0  Musculoskeletal: Strength & Muscle Tone: within normal limits Gait & Station: normal Patient leans: N/A  Psychiatric Specialty Exam: Physical Exam  Nursing note and vitals reviewed. Psychiatric: He has a normal mood and affect. His behavior is normal. Judgment and thought content normal.    ROS  Blood pressure 112/73, pulse 68, temperature 98.2 F (36.8 C), temperature source Oral, resp. rate 16, height 5\' 7"  (1.702 m), weight 66.2 kg (146 lb).Body mass index is 22.87 kg/m.    Have you used any form of tobacco in the last 30 days? (Cigarettes, Smokeless Tobacco, Cigars, and/or Pipes): No  Has this patient used any form of tobacco in the last 30 days? (Cigarettes, Smokeless Tobacco, Cigars, and/or Pipes) Yes, N/A  Blood Alcohol level:  Lab Results  Component Value Date   [facility]  01/10[facility]2016   ETH <11 03/04/2014    Metabolic Disorder Labs:  Lab Results  Component Value Date   HGBA1C 5.5 01/13/2016   MPG 111 01/13/2016   No results found for: PROLACTIN Lab Results  Component Value Date   CHOL 178 01/13/2016   TRIG 77 01/13/2016   HDL 95 01/13/2016   CHOLHDL 1.9 01/13/2016   VLDL 15 01/13/2016   LDLCALC 68 01/13/2016    See Psychiatric Specialty Exam and Suicide Risk Assessment completed by Attending Physician prior to discharge.  Discharge destination:  Home  Is patient on multiple antipsychotic therapies at discharge:  No   Has Patient had three or more failed trials of antipsychotic monotherapy by history:  No  Recommended Plan for Multiple Antipsychotic Therapies: NA     Medication List    STOP taking these medications   acetaminophen 325 MG tablet Commonly known as:  TYLENOL   dicyclomine 20 MG tablet Commonly known as:  BENTYL   omeprazole 20 MG capsule Commonly known as:  PRILOSEC   ondansetron 4 MG tablet Commonly known as:  ZOFRAN     TAKE these medications     Indication  ARIPiprazole 5 MG tablet Commonly known as:  ABILIFY Take 1 tablet (5 mg total) by mouth daily. Start taking on:  01/20/2016  Indication:  mood stabilization   citalopram 20 MG tablet Commonly known as:  CELEXA Take 1 tablet (20 mg total) by mouth at bedtime.  Indication:  Depression   tamsulosin 0.4 MG Caps capsule  Commonly known as:  FLOMAX Take 1 capsule (0.4 mg total) by mouth daily after supper.  Indication:  Benign Enlargement of Prostate   traZODone 100 MG tablet Commonly known as:  DESYREL Take 1 tablet (100 mg total) by mouth at bedtime.  Indication:  Trouble Sleeping      Follow-up Information    [facility] Follow up on 01/26/2016.   Why:  Screening for possible admission on this date at 7:45AM.  Contact information: [address] [place] [address] [phone]        [facility] .   Why:  Referral faxed:  01/13/16 Contact information: [address] [place], [place] [address] Phone: [phone] Fax: [phone]       [facility] .   Why:  Walk in for assessment during open access hours: Monday, Wednesday, Friday between 9am-11:30AM. (medication management and outpatient substance abuse counseling services). Thank you.  Contact information: [address] [place], [place] [address] Phone: [phone] Fax: [phone]          Follow-up recommendations:  Activity:  as tol Diet:  as tol  Comments:  1.  Take all your medications as prescribed.   2.  Report any adverse side effects to outpatient provider. 3.  Patient instructed to not use alcohol or illegal drugs while on prescription medicines. 4.  In the event of worsening symptoms, instructed patient to call 911, the crisis hotline or go to nearest emergency room for evaluation of symptoms.  Signed: Lindwood QuaSheila May Kitiara Hintze, NP [facility] 01/19/2016, 3:59 PM

## 2016-01-19 NOTE — Progress Notes (Signed)
D:  Patient's self inventory sheet, patient has fair sleep, no sleep medication given.  Fair appetite, normal energy level, good concentration.  Rated depression 4, hopeless and anxiety 2.  Denied withdrawals.  Denied SI.  Physical problems, back ache, pain medication is helpful.  Goal is to recover.  Plans to speak to SW for  Interview.   Does have discharge plans. A:  Medications administered per MD orders.  Emotional support and encouragement given patient. R:  Denied SI and HI, contract for safety.  Denied A/V hallucinations.  Safety maintained with 15 minute checks.

## 2016-01-19 NOTE — BHH Suicide Risk Assessment (Signed)
[facility]   Principal Problem: Cocaine dependence, continuous [facility](HCC) Discharge Diagnoses:  Patient Active Problem List   Diagnosis Date Noted  . Cocaine dependence, continuous (HCC) [F14.20] 01/19/2016  . Substance-induced psychotic disorder with hallucinations (HCC) [F19.951] 01/18/2016  . Major depressive disorder, recurrent severe without psychotic features (HCC) [F33.2] 01/12/2016  . Polysubstance (including opioids) dependence with physiol dependence (HCC) [F19.20] 01/11/2016  . Pulmonary emboli (HCC) [I26.99] 01/16/2014  . Tobacco use [Z72.0] 01/16/2014  . Multiple pulmonary emboli (HCC) [I26.99] 01/16/2014  . Protein C deficiency (HCC) [D68.59] 12/31/2013  . Acute DVT (deep venous thrombosis) (HCC) [I82.409] 12/29/2013  . Pleuritic chest pain [R07.81] 12/29/2013  . Dyspnea [R06.00] 12/29/2013  . Chest pain [R07.9] 12/29/2013    Total Time spent with patient: 15 minutes  Musculoskeletal: Strength & Muscle Tone: within normal limits Gait & Station: normal Patient leans: N/A  Psychiatric Specialty Exam: ROS  Blood pressure 112/73, pulse 68, temperature 98.2 F (36.8 C), temperature source Oral, resp. rate 16, height 5\' 7"  (1.702 m), weight 66.2 kg (146 lb).Body mass index is 22.87 kg/m.  General Appearance: Casual  Eye Contact::  Good  Speech:  Clear and Coherent409  Volume:  Normal  Mood:  Euthymic  Affect:  Congruent  Thought Process:  Coherent  Orientation:  Negative  Thought Content:  Negative  Suicidal Thoughts:  No  Homicidal Thoughts:  No  Memory:  Negative  Judgement:  Fair  Insight:  Fair  Psychomotor Activity:  Normal  Concentration:  Good  Recall:  Good  Fund of Knowledge:Good  Language: Good  Akathisia:  No  Handed:  Right  AIMS (if indicated):     Assets:  Resilience  Sleep:  Number of Hours: 5.25  Cognition: WNL  ADL's:  Intact   Mental Status Per Nursing Assessment::   On Admission:  Suicidal ideation indicated  by patient, Self-harm thoughts, Self-harm behaviors  Demographic Factors:  Male and Low socioeconomic status  Loss Factors: Decline in physical health  Historical Factors: NA  Risk Reduction Factors:   Positive coping skills or problem solving skills  Continued Clinical Symptoms:  Alcohol/Substance Abuse/Dependencies  Cognitive Features That Contribute To Risk:  None    Suicide Risk:  Mild:  Suicidal ideation of limited frequency, intensity, duration, and specificity.  There are no identifiable plans, no associated intent, mild dysphoria and related symptoms, good self-control (both objective and subjective assessment), few other risk factors, and identifiable protective factors, including available and accessible social support.  Follow-up Information    [facility] Follow up on 01/26/2016.   Why:  Screening for possible admission on this date at 7:45AM.  Contact information: [address] [place] [address] [phone]        [facility] .   Why:  Referral faxed: 01/13/16 Contact information: [address] [place], [place] [address] Phone: [phone] Fax: [phone]       [facility] .   Why:  Walk in for assessment during open access hours: Monday, Wednesday, Friday between 9am-11:30AM. (medication management and outpatient substance abuse counseling services). Thank you.  Contact information: [address] [place], [place] [address] Phone: [phone] Fax: [phone]          Plan Of Care/Follow-up recommendations:  Other:  Patient denies current suicidal or homicidal ideation, plan or intent. Plan is for patient to be discharged to [facility].  Acquanetta SitElizabeth Woods Oates, MD 01/19/2016, 3:11 PM

## 2016-01-19 NOTE — Progress Notes (Signed)
CSW met with patient after group this afternoon. Patient denies SI/HI/AVH and just completed his phone screening with [facility]. Pt pleasant and cooperative in the group setting.   Maxie Better, MSW, LCSW Clinical Social Worker 01/19/2016 2:24 PM

## 2016-01-19 NOTE — Progress Notes (Signed)
Recreation Therapy Notes  Date: 01/19/16 Time: 0930 Location: [address] Group   Group Topic: Stress Management  Goal Area(s) Addresses:  Patient will verbalize importance of using healthy stress management.  Patient will identify positive emotions associated with healthy stress management.   Intervention: Stress Management  Activity :  Imagery to Connect with Feelings.  LRT introduced the stress management technique of guided imagery.  LRT read a script to allow the patients to examine and connect with their inner feelings.  Patients were to follow along and engaged as LRT read script.  Education:  Stress Management, Discharge Planning.   Education Outcome: Acknowledges edcuation/In group clarification offered/Needs additional education  Clinical Observations/Feedback: Pt did not attend group.     Fabricio Endsley, LRT/CTRS         Cortney Mckinney A 01/19/2016 12:19 PM 

## 2016-01-19 NOTE — Progress Notes (Signed)
[facility]  01/19/2016 1:58 PM  Patient Active Problem List   Diagnosis Date Noted  . 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 Polysubstance (including opioids) dependence with physiol dependence (HCC) 01/11/2016  . Pulmonary emboli (HCC) 01/16/2014  . Tobacco use 01/16/2014  . Multiple pulmonary emboli (HCC) 01/16/2014  . Protein C deficiency (HCC) 12/31/2013  . Acute DVT (deep venous thrombosis) (HCC) 12/29/2013  . Pleuritic chest pain 12/29/2013  . Dyspnea 12/29/2013  . Chest pain 12/29/2013    Diagnosis:Cocaine use, depression, possible CT E  Subjective: Patient denies suicidal or homicidal ideation, plan or intent and states his mood is good today. He is having a phone interview for placement with [facility], tomorrow,today. He reports that with the Flomax and changing to aripiprazole is urinary symptoms are improved. He denies bothersome auditory hallucinations. Normal PSA and UA results were shared with patient. Objective: Well-developed well-nourished man in no apparent distress pleasant and appropriate mood is all right affect is euthymic speech and motor are within normal limits thought processes linear and goal-directed thought content denies current auditory hallucinations, suicidal or homicidal ideation, plan or intent insight and judgment are fair IQ appears an average range         Current Facility-Administered Medications (Analgesics):  .  acetaminophen (TYLENOL) tablet 650 mg     Current Facility-Administered Medications (Other):  .  alum & mag hydroxide-simeth (MAALOX/MYLANTA) 200-200-20 MG/5ML suspension 30 mL .  ARIPiprazole (ABILIFY) tablet 5 mg .  citalopram (CELEXA) tablet 20 mg .  magnesium hydroxide (MILK OF MAGNESIA) suspension 30 mL .  tamsulosin (FLOMAX) capsule 0.4 mg .  traZODone (DESYREL) tablet 100 mg  No  current outpatient prescriptions on file.  Vital Signs:Blood pressure 112/73, pulse 68, temperature 98.2 F (36.8 C), temperature source Oral, resp. rate 16, height 5\' 7"  (1.702 m), weight 66.2 kg (146 lb).    Lab Results:  Results for orders placed or performed during the hospital encounter of 01/12/16 (from the past 48 hour(s))  Urinalysis, Routine w reflex microscopic (not at [facility])     Status: None   Collection Time: 01/18/16 11:50 AM  Result Value Ref Range   Color, Urine YELLOW YELLOW   APPearance CLEAR CLEAR   Specific Gravity, Urine 1.011 1.005 - 1.030   pH 7.0 5.0 - 8.0   Glucose, UA NEGATIVE NEGATIVE mg/dL   Hgb urine dipstick NEGATIVE NEGATIVE   Bilirubin Urine NEGATIVE NEGATIVE   Ketones, ur NEGATIVE NEGATIVE mg/dL   Protein, ur NEGATIVE NEGATIVE mg/dL   Nitrite NEGATIVE NEGATIVE   Leukocytes, UA NEGATIVE NEGATIVE    Comment: MICROSCOPIC NOT DONE ON URINES WITH NEGATIVE PROTEIN, BLOOD, LEUKOCYTES, NITRITE, OR GLUCOSE <1000 mg/dL. [facility]     Physical Findings: AIMS: Facial and Oral Movements Muscles of Facial Expression: None, normal Lips and Perioral Area: None, normal Jaw: None, normal Tongue: None, normal,Extremity Movements Upper (arms, wrists, hands, fingers): None, normal Lower (legs, knees, ankles, toes): None, normal, Trunk Movements Neck, shoulders, hips: None, normal, Overall Severity Severity of abnormal movements (highest score from questions above): None, normal Incapacitation due to abnormal movements: None, normal Patient's awareness of abnormal movements (rate only patient's report): No Awareness, Dental Status Current problems with teeth and/or dentures?: No Does patient usually wear dentures?: No  CIWA:  CIWA-Ar Total: 1 COWS:  COWS Total Score: 0   Assessment/Plan: Patient is stable and ready for discharge.  [facility]. 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  Acquanetta SitElizabeth Woods Kemi Gell, MD 01/19/2016, 1:58 PM

## 2016-01-19 NOTE — Tx Team (Signed)
Interdisciplinary Treatment and Diagnostic Plan Update  01/19/2016 Time of Session: 9:30AM Peter Washington MRN: [id number]  Principal Diagnosis: Cocaine dependence, continuous ([place])  Secondary Diagnoses: Principal Problem:   Cocaine dependence, continuous ([place]) Active Problems:   Substance-induced psychotic disorder with hallucinations ([place])   Current Medications:  Current Facility-Administered Medications  Medication Dose Route Frequency Provider Last Rate Last Dose  . acetaminophen (TYLENOL) tablet 650 mg  650 mg Oral Q6H PRN Patrecia Pour, NP   650 mg at 01/19/16 0640  . alum & mag hydroxide-simeth (MAALOX/MYLANTA) 200-200-20 MG/5ML suspension 30 mL  30 mL Oral Q4H PRN Patrecia Pour, NP      . ARIPiprazole (ABILIFY) tablet 5 mg  5 mg Oral Daily Kerrie Buffalo, NP   5 mg at 01/19/16 0837  . citalopram (CELEXA) tablet 20 mg  20 mg Oral QHS Linard Millers, MD   20 mg at 01/18/16 2248  . magnesium hydroxide (MILK OF MAGNESIA) suspension 30 mL  30 mL Oral Daily PRN Patrecia Pour, NP      . tamsulosin [facility]) capsule 0.4 mg  0.4 mg Oral QPC supper Linard Millers, MD   0.4 mg at 01/18/16 1823  . traZODone (DESYREL) tablet 100 mg  100 mg Oral QHS Kerrie Buffalo, NP   100 mg at 01/18/16 2248   PTA Medications: Prescriptions Prior to Admission  Medication Sig Dispense Refill Last Dose  . acetaminophen (TYLENOL) 325 MG tablet Take 2 tablets (650 mg total) by mouth every 6 (six) hours as needed for fever. (Patient not taking: Reported on 01/10/2016) 30 tablet 0 Not Taking at Unknown time  . dicyclomine (BENTYL) 20 MG tablet Take 1 tablet (20 mg total) by mouth 2 (two) times daily. 20 tablet 0   . omeprazole (PRILOSEC) 20 MG capsule Take 1 capsule (20 mg total) by mouth daily. 30 capsule 0   . ondansetron (ZOFRAN) 4 MG tablet Take 1 tablet (4 mg total) by mouth every 8 (eight) hours as needed for nausea or vomiting. (Patient not taking: Reported on 01/10/2016) 20 tablet 0 Not Taking  at Unknown time    Patient Stressors: Financial difficulties Loss of significant relationship Occupational concerns Substance abuse  Patient Strengths: Capable of independent living Motivation for treatment/growth Supportive family/friends  Treatment Modalities: Medication Management, Group therapy, Case management,  1 to 1 session with clinician, Psychoeducation, Recreational therapy.   Physician Treatment Plan for Primary Diagnosis: Cocaine dependence, continuous ([place]) Long Term Goal(s): Improvement in symptoms so as ready for discharge Improvement in symptoms so as ready for discharge   Short Term Goals: Ability to verbalize feelings will improve Ability to disclose and discuss suicidal ideas Ability to identify changes in lifestyle to reduce recurrence of condition will improve Ability to identify and develop effective coping behaviors will improve Compliance with prescribed medications will improve  Medication Management: Evaluate patient's response, side effects, and tolerance of medication regimen.  Therapeutic Interventions: 1 to 1 sessions, Unit Group sessions and Medication administration.  Evaluation of Outcomes: Met  Physician Treatment Plan for Secondary Diagnosis: Principal Problem:   Cocaine dependence, continuous ([place]) Active Problems:   Substance-induced psychotic disorder with hallucinations ([place])  Long Term Goal(s): Improvement in symptoms so as ready for discharge Improvement in symptoms so as ready for discharge   Short Term Goals: Ability to verbalize feelings will improve Ability to disclose and discuss suicidal ideas Ability to identify changes in lifestyle to reduce recurrence of condition will improve Ability to identify and develop effective coping  behaviors will improve Compliance with prescribed medications will improve     Medication Management: Evaluate patient's response, side effects, and tolerance of medication regimen.  Therapeutic  Interventions: 1 to 1 sessions, Unit Group sessions and Medication administration.  Evaluation of Outcomes: Met   RN Treatment Plan for Primary Diagnosis: Cocaine dependence, continuous ([place]) Long Term Goal(s): Knowledge of disease and therapeutic regimen to maintain health will improve  Short Term Goals: Ability to remain free from injury will improve, Ability to disclose and discuss suicidal ideas and Ability to identify and develop effective coping behaviors will improve  Medication Management: RN will administer medications as ordered by provider, will assess and evaluate patient's response and provide education to patient for prescribed medication. RN will report any adverse and/or side effects to prescribing provider.  Therapeutic Interventions: 1 on 1 counseling sessions, Psychoeducation, Medication administration, Evaluate responses to treatment, Monitor vital signs and CBGs as ordered, Perform/monitor CIWA, COWS, AIMS and Fall Risk screenings as ordered, Perform wound care treatments as ordered.  Evaluation of Outcomes: Met   LCSW Treatment Plan for Primary Diagnosis: Cocaine dependence, continuous ([place]) Long Term Goal(s): Safe transition to appropriate next level of care at discharge, Engage patient in therapeutic group addressing interpersonal concerns.  Short Term Goals: Engage patient in aftercare planning with referrals and resources, Facilitate patient progression through stages of change regarding substance use diagnoses and concerns and Identify triggers associated with mental health/substance abuse issues  Therapeutic Interventions: Assess for all discharge needs, 1 to 1 time with Social worker, Explore available resources and support systems, Assess for adequacy in community support network, Educate family and significant other(s) on suicide prevention, Complete Psychosocial Assessment, Interpersonal group therapy.  Evaluation of Outcomes: Met   Progress in  Treatment: Attending groups: Yes Participating in groups: Yes Taking medication as prescribed: Yes. Toleration medication: Yes. Family/Significant other contact made: SPE completed with pt; pt declined to consent to family contact.  Patient understands diagnosis: Yes. Discussing patient identified problems/goals with staff: Yes. Medical problems stabilized or resolved: Yes. Denies suicidal/homicidal ideation: Yes-self report.  Issues/concerns per patient self-inventory: Yes Other: pt reporting AVH/appears to be poor historian regarding his substance use/abuse.  New problem(s) identified: Yes, Describe:  poor historian-inconsistencies with substance use reported by patient and labs  New Short Term/Long Term Goal(s): medication stabilization, elimination of AVH; detox.   Discharge Plan or Barriers: Pt accepted to [facility] for Tuesday morning. Driver will pick him up between 9am-9:15am. 21 day supply requested by CSW to pharmacy. NP and MD notified as well.   Reason for Continuation of Hospitalization: none  Estimated Length of Stay: discharge scheduled for Tuesday morning between 61m9:15am--[facility].   Attendees: Patient: 01/19/2016 4:33 PM  Physician: Dr. OSharolyn DouglasMD 01/19/2016 4:33 PM  Nursing: DBertrum SolRN 01/19/2016 4:33 PM  RN Care Manager: JLars PinksCM 01/19/2016 4:33 PM  Social Worker: HMaxie Better LCSW 01/19/2016 4:33 PM  Recreational Therapist:  01/19/2016 4:33 PM  Other:  01/19/2016 4:33 PM  Other:  01/19/2016 4:33 PM  Other: 01/19/2016 4:33 PM    Scribe for Treatment Team: HMount Carmel LCSW 01/19/2016 4:33 PM

## 2016-01-19 NOTE — BHH Group Notes (Signed)
BHH LCSW Group Therapy  01/19/2016 4:37 PM  Type of Therapy:  Group Therapy  Participation Level:  Active  Participation Quality:  Attentive  Affect:  Appropriate  Cognitive:  Alert and Oriented  Insight:  Improving  Engagement in Therapy:  Engaged  Modes of Intervention:  Discussion, Education, Exploration, Problem-solving, Rapport Building, Socialization and Support  Summary of Progress/Problems: Today's Topic: Overcoming Obstacles. Patients identified one short term goal and potential obstacles in reaching this goal. Patients processed barriers involved in overcoming these obstacles. Patients identified steps necessary for overcoming these obstacles and explored motivation (internal and external) for facing these difficulties head on. Peter Washington was attentive and engaged during today's processing group. He shared that his biggest obstacle is figuring out what to do from here. "[facility] and hope that someone there will work with me in terms of a long term plan." Other group members [facility] and pt was receptive to information provided to him by CSW and other group members. He continues to demonstrate improving insight.   Ledell PeoplesHeather N Smart 01/19/2016, 4:37 PM

## 2016-01-19 NOTE — Progress Notes (Signed)
  [facility] :  Will you be returning to the same living situation after discharge:  No. [facility]  At discharge, do you have transportation home?: Yes,  [facility] driver will pick up patient at 9-9:15AM on 01/20/16.  Do you have the ability to pay for your medications: Yes,  mental health  Release of information consent forms completed and submitted to medical records by CSW.  Patient to Follow up at: Follow-up Information    [facility] Follow up on 01/26/2016.   Why:  Screening for possible admission on this date at 7:45AM.  Contact information: [address] [place] [address] [phone]        [facility] Follow up on 01/20/2016.   Why:  You have been accepted for admission for Tuesday, 01/20/16. [facility] driver will pick you up between 9AM-9:15AM on this date for transport to facility.  Contact information: [address] [place], [place] [address] Phone: [phone] Fax: [phone]       [facility] Follow up.   Why:  Walk in for assessment during open access hours: Monday, Wednesday, Friday between 9am-11:30AM. (medication management and outpatient substance abuse counseling services). Thank you.  Contact information: [address] [place], [place] [address] Phone: [phone] Fax: [phone]          Next level of care provider has access to [facility] Link:no  Safety Planning and Suicide Prevention discussed: Yes,  SPE completed with pt; pt declined to consent to family contact.  Have you used any form of tobacco in the last 30 days? (Cigarettes, Smokeless Tobacco, Cigars, and/or Pipes): No  Has patient been referred to the Quitline?: N/A patient is not a smoker  Patient has been referred for addiction treatment: Yes  Ledell PeoplesHeather N Smart LCSW 01/19/2016, 4:31 PM

## 2016-01-19 NOTE — Progress Notes (Signed)
Patient attended AA group meeting tonight.  

## 2016-01-20 NOTE — Progress Notes (Signed)
Pt was in his room at the beginning of the shift and then came out to go to AA group.  After group, writer tried to speak with pt about his day, but he walked off while Clinical research associatewriter was talking.  He seemed gruff and irritable, stating he did not want his medicine.  Then later he came to the med window in a different mood, smiling and jovial with Clinical research associatewriter.  He took his evening medication.  He reported he had a good day and [facility]  He voiced no needs or concerns.  He says he is ready to go to rehab.  He denies SI/HI/AVH.  Support and encouragement offered.  Discharge plans are in process.  Safety maintained with q15 minute checks.

## 2016-01-20 NOTE — Progress Notes (Signed)
Discharge Note:  Patient discharged with driver.  Patient denied SI and HI.  Denied A/V hallucinations.  Suicide prevention information given and discussed with patient who stated he understood and had no questions.  Patient stated he received all his belongings, clothing, toiletries, misc items, prescriptions, medications.  Patient stated he appreciated all assistance [facility].  All required discharge information given to patient at discharge.

## 2016-01-20 NOTE — Progress Notes (Signed)
D:  Patient's self inventory sheet, patient has fair sleep, no sleep medication given.  Fair appetite, normal energy level, good concentration.  Rated depression 2, denied hopeless and anxiety.  Denied withdrawals.  Denied SI.  Physical problems, swelling in left foot.  No physical pain.  Goal is to stay focus.  Plans to focus on the moment.  Does have discharge plans. A:  Medications administered per MD orders.  Emotional support and encouragement given to patient. R:  Patient denied SI and HI.  Denied A/V hallucinations.  Safety maintained with 15 minute checks.

## 2016-02-01 ENCOUNTER — Emergency Department (HOSPITAL_COMMUNITY): Payer: Self-pay

## 2016-02-01 ENCOUNTER — Inpatient Hospital Stay (HOSPITAL_COMMUNITY)
Admission: EM | Admit: 2016-02-01 | Discharge: 2016-02-07 | DRG: 300 | Disposition: A | Discharge status: Home / Self Care | Payer: Self-pay | Attending: Internal Medicine | Admitting: Internal Medicine

## 2016-02-01 ENCOUNTER — Emergency Department (HOSPITAL_BASED_OUTPATIENT_CLINIC_OR_DEPARTMENT_OTHER)
Admit: 2016-02-01 | Discharge: 2016-02-01 | Disposition: A | Discharge status: Home / Self Care | Payer: Self-pay | Attending: Emergency Medicine | Admitting: Emergency Medicine

## 2016-02-01 ENCOUNTER — Encounter (HOSPITAL_COMMUNITY): Payer: Self-pay

## 2016-02-01 DIAGNOSIS — F329 Major depressive disorder, single episode, unspecified: Secondary | ICD-10-CM | POA: Diagnosis present

## 2016-02-01 DIAGNOSIS — F17210 Nicotine dependence, cigarettes, uncomplicated: Secondary | ICD-10-CM | POA: Diagnosis present

## 2016-02-01 DIAGNOSIS — D6859 Other primary thrombophilia: Secondary | ICD-10-CM | POA: Diagnosis present

## 2016-02-01 DIAGNOSIS — I82433 Acute embolism and thrombosis of popliteal vein, bilateral: Secondary | ICD-10-CM | POA: Diagnosis present

## 2016-02-01 DIAGNOSIS — R0602 Shortness of breath: Secondary | ICD-10-CM

## 2016-02-01 DIAGNOSIS — Z9119 Patient's noncompliance with other medical treatment and regimen: Secondary | ICD-10-CM

## 2016-02-01 DIAGNOSIS — I824Z3 Acute embolism and thrombosis of unspecified deep veins of distal lower extremity, bilateral: Secondary | ICD-10-CM

## 2016-02-01 DIAGNOSIS — F1120 Opioid dependence, uncomplicated: Secondary | ICD-10-CM | POA: Diagnosis present

## 2016-02-01 DIAGNOSIS — R310 Gross hematuria: Secondary | ICD-10-CM | POA: Diagnosis not present

## 2016-02-01 DIAGNOSIS — M7989 Other specified soft tissue disorders: Secondary | ICD-10-CM

## 2016-02-01 DIAGNOSIS — I82409 Acute embolism and thrombosis of unspecified deep veins of unspecified lower extremity: Secondary | ICD-10-CM | POA: Diagnosis present

## 2016-02-01 DIAGNOSIS — R0781 Pleurodynia: Secondary | ICD-10-CM

## 2016-02-01 DIAGNOSIS — Z86711 Personal history of pulmonary embolism: Secondary | ICD-10-CM

## 2016-02-01 DIAGNOSIS — F32A Depression, unspecified: Secondary | ICD-10-CM

## 2016-02-01 DIAGNOSIS — R338 Other retention of urine: Secondary | ICD-10-CM | POA: Diagnosis present

## 2016-02-01 DIAGNOSIS — Z720 Tobacco use: Secondary | ICD-10-CM | POA: Diagnosis present

## 2016-02-01 DIAGNOSIS — R079 Chest pain, unspecified: Secondary | ICD-10-CM

## 2016-02-01 DIAGNOSIS — D500 Iron deficiency anemia secondary to blood loss (chronic): Secondary | ICD-10-CM

## 2016-02-01 DIAGNOSIS — Z86718 Personal history of other venous thrombosis and embolism: Secondary | ICD-10-CM

## 2016-02-01 DIAGNOSIS — K5900 Constipation, unspecified: Secondary | ICD-10-CM | POA: Diagnosis present

## 2016-02-01 DIAGNOSIS — N401 Enlarged prostate with lower urinary tract symptoms: Secondary | ICD-10-CM | POA: Diagnosis present

## 2016-02-01 DIAGNOSIS — M79609 Pain in unspecified limb: Secondary | ICD-10-CM

## 2016-02-01 DIAGNOSIS — I82413 Acute embolism and thrombosis of femoral vein, bilateral: Principal | ICD-10-CM

## 2016-02-01 DIAGNOSIS — F1010 Alcohol abuse, uncomplicated: Secondary | ICD-10-CM

## 2016-02-01 DIAGNOSIS — S3730XA Unspecified injury of urethra, initial encounter: Secondary | ICD-10-CM | POA: Diagnosis not present

## 2016-02-01 DIAGNOSIS — E860 Dehydration: Secondary | ICD-10-CM | POA: Diagnosis present

## 2016-02-01 DIAGNOSIS — Z79899 Other long term (current) drug therapy: Secondary | ICD-10-CM

## 2016-02-01 DIAGNOSIS — N3289 Other specified disorders of bladder: Secondary | ICD-10-CM | POA: Diagnosis not present

## 2016-02-01 DIAGNOSIS — F1420 Cocaine dependence, uncomplicated: Secondary | ICD-10-CM | POA: Diagnosis present

## 2016-02-01 DIAGNOSIS — F1920 Other psychoactive substance dependence, uncomplicated: Secondary | ICD-10-CM | POA: Diagnosis present

## 2016-02-01 LAB — HEPATIC FUNCTION PANEL
ALBUMIN: 3.9 g/dL (ref 3.5–5.0)
ALT: 20 U/L (ref 17–63)
AST: 22 U/L (ref 15–41)
Alkaline Phosphatase: 48 U/L (ref 38–126)
Bilirubin, Direct: <0.1 mg/dL — ABNORMAL LOW (ref 0.1–0.5)
TOTAL PROTEIN: 6.8 g/dL (ref 6.5–8.1)
Total Bilirubin: 0.9 mg/dL (ref 0.3–1.2)

## 2016-02-01 LAB — RAPID URINE DRUG SCREEN, HOSP PERFORMED
Amphetamines: NOT DETECTED
BARBITURATES: NOT DETECTED
BENZODIAZEPINES: NOT DETECTED
COCAINE: POSITIVE — AB
Opiates: NOT DETECTED
TETRAHYDROCANNABINOL: NOT DETECTED

## 2016-02-01 LAB — BASIC METABOLIC PANEL
ANION GAP: 4 — AB (ref 5–15)
BUN: 17 mg/dL (ref 6–20)
CALCIUM: 8.8 mg/dL — AB (ref 8.9–10.3)
CO2: 29 mmol/L (ref 22–32)
CREATININE: 1.2 mg/dL (ref 0.61–1.24)
Chloride: 106 mmol/L (ref 101–111)
GFR calc Af Amer: >60 mL/min (ref >60)
GLUCOSE: 88 mg/dL (ref 65–99)
Potassium: 3.8 mmol/L (ref 3.5–5.1)
Sodium: 139 mmol/L (ref 135–145)

## 2016-02-01 LAB — I-STAT TROPONIN, ED
TROPONIN I, POC: 0 ng/mL (ref 0.00–0.08)
Troponin i, poc: 0 ng/mL (ref 0.00–0.08)

## 2016-02-01 LAB — CBC
HCT: 38.3 % — ABNORMAL LOW (ref 39.0–52.0)
HEMOGLOBIN: 12.5 g/dL — AB (ref 13.0–17.0)
MCH: 26.3 pg (ref 26.0–34.0)
MCHC: 32.6 g/dL (ref 30.0–36.0)
MCV: 80.5 fL (ref 78.0–100.0)
PLATELETS: 233 10*3/uL (ref 150–400)
RBC: 4.76 MIL/uL (ref 4.22–5.81)
RDW: 15.1 % (ref 11.5–15.5)
WBC: 6.6 10*3/uL (ref 4.0–10.5)

## 2016-02-01 LAB — APTT: aPTT: 33 seconds (ref 24–36)

## 2016-02-01 LAB — PHOSPHORUS: Phosphorus: 3.3 mg/dL (ref 2.5–4.6)

## 2016-02-01 LAB — MAGNESIUM: Magnesium: 2.2 mg/dL (ref 1.7–2.4)

## 2016-02-01 LAB — PROTIME-INR
INR: 1.03
Prothrombin Time: 13.5 seconds (ref 11.4–15.2)

## 2016-02-01 LAB — ETHANOL: Alcohol, Ethyl (B): <5 mg/dL (ref <5)

## 2016-02-01 MED ORDER — VITAMIN B-1 100 MG PO TABS
100.0000 mg | ORAL_TABLET | Freq: Every day | ORAL | Status: DC
  Administered 2016-02-01 – 2016-02-07 (×7): 100 mg via ORAL
  Filled 2016-02-01 (×7): qty 1

## 2016-02-01 MED ORDER — THIAMINE HCL 100 MG/ML IJ SOLN: 100.0000 mg | Freq: Every day | INTRAMUSCULAR | Status: DC

## 2016-02-01 MED ORDER — LORAZEPAM 1 MG PO TABS
1.0000 mg | ORAL_TABLET | Freq: Four times a day (QID) | ORAL | Status: AC | PRN
  Administered 2016-02-04 (×2): 1 mg via ORAL
  Filled 2016-02-01 (×2): qty 1

## 2016-02-01 MED ORDER — SODIUM CHLORIDE 0.9% FLUSH
3.0000 mL | Freq: Two times a day (BID) | INTRAVENOUS | Status: DC
  Administered 2016-02-02 – 2016-02-07 (×6): 3 mL via INTRAVENOUS

## 2016-02-01 MED ORDER — HEPARIN BOLUS VIA INFUSION
2050.0000 [IU] | INTRAVENOUS | Status: AC
  Administered 2016-02-01: 2050 [IU] via INTRAVENOUS
  Filled 2016-02-01: qty 2050

## 2016-02-01 MED ORDER — ONDANSETRON HCL 4 MG/2ML IJ SOLN
4.0000 mg | Freq: Four times a day (QID) | INTRAMUSCULAR | Status: DC | PRN
Start: 2016-02-01 — End: 2016-02-07

## 2016-02-01 MED ORDER — NICOTINE 14 MG/24HR TD PT24
14.0000 mg | MEDICATED_PATCH | Freq: Every day | TRANSDERMAL | Status: DC
  Filled 2016-02-01: qty 1

## 2016-02-01 MED ORDER — IOPAMIDOL (ISOVUE-370) INJECTION 76%
INTRAVENOUS | Status: AC
  Filled 2016-02-01: qty 100

## 2016-02-01 MED ORDER — ACETAMINOPHEN 650 MG RE SUPP: 650.0000 mg | Freq: Four times a day (QID) | RECTAL | Status: DC | PRN

## 2016-02-01 MED ORDER — LORAZEPAM 2 MG/ML IJ SOLN
1.0000 mg | Freq: Four times a day (QID) | INTRAMUSCULAR | Status: AC | PRN
  Administered 2016-02-01 – 2016-02-03 (×2): 1 mg via INTRAVENOUS
  Filled 2016-02-01 (×2): qty 1

## 2016-02-01 MED ORDER — SODIUM CHLORIDE 0.9 % IV SOLN
INTRAVENOUS | Status: AC
  Administered 2016-02-01 – 2016-02-02 (×2): via INTRAVENOUS

## 2016-02-01 MED ORDER — IOPAMIDOL (ISOVUE-370) INJECTION 76%
100.0000 mL | Freq: Once | INTRAVENOUS | Status: AC | PRN
  Administered 2016-02-01: 100 mL via INTRAVENOUS

## 2016-02-01 MED ORDER — TAMSULOSIN HCL 0.4 MG PO CAPS
0.4000 mg | ORAL_CAPSULE | Freq: Every day | ORAL | Status: DC
  Administered 2016-02-01 – 2016-02-07 (×7): 0.4 mg via ORAL
  Filled 2016-02-01 (×7): qty 1

## 2016-02-01 MED ORDER — CITALOPRAM HYDROBROMIDE 20 MG PO TABS
20.0000 mg | ORAL_TABLET | Freq: Every day | ORAL | Status: DC
  Administered 2016-02-02 – 2016-02-07 (×3): 20 mg via ORAL
  Filled 2016-02-01 (×7): qty 1

## 2016-02-01 MED ORDER — HEPARIN (PORCINE) IN NACL 100-0.45 UNIT/ML-% IJ SOLN
1250.0000 [IU]/h | INTRAMUSCULAR | Status: DC
  Administered 2016-02-01 – 2016-02-04 (×4): 1250 [IU]/h via INTRAVENOUS
  Filled 2016-02-01 (×5): qty 250

## 2016-02-01 MED ORDER — HYDROCODONE-ACETAMINOPHEN 5-325 MG PO TABS
1.0000 | ORAL_TABLET | ORAL | Status: DC | PRN
  Administered 2016-02-01 – 2016-02-03 (×4): 2 via ORAL
  Filled 2016-02-01 (×4): qty 2

## 2016-02-01 MED ORDER — FOLIC ACID 1 MG PO TABS
1.0000 mg | ORAL_TABLET | Freq: Every day | ORAL | Status: DC
  Administered 2016-02-01 – 2016-02-07 (×7): 1 mg via ORAL
  Filled 2016-02-01 (×7): qty 1

## 2016-02-01 MED ORDER — ONDANSETRON HCL 4 MG PO TABS: 4.0000 mg | ORAL_TABLET | Freq: Four times a day (QID) | ORAL | Status: DC | PRN

## 2016-02-01 MED ORDER — SODIUM CHLORIDE 0.9 % IJ SOLN
INTRAMUSCULAR | Status: AC
  Filled 2016-02-01: qty 50

## 2016-02-01 MED ORDER — FENTANYL CITRATE (PF) 100 MCG/2ML IJ SOLN
75.0000 ug | Freq: Once | INTRAMUSCULAR | Status: AC
  Administered 2016-02-01: 75 ug via INTRAVENOUS
  Filled 2016-02-01: qty 2

## 2016-02-01 MED ORDER — ACETAMINOPHEN 325 MG PO TABS: 650.0000 mg | ORAL_TABLET | Freq: Four times a day (QID) | ORAL | Status: DC | PRN

## 2016-02-01 MED ORDER — KETOROLAC TROMETHAMINE 30 MG/ML IJ SOLN
30.0000 mg | Freq: Once | INTRAMUSCULAR | Status: AC
  Administered 2016-02-01: 30 mg via INTRAVENOUS
  Filled 2016-02-01: qty 1

## 2016-02-01 MED ORDER — ARIPIPRAZOLE 5 MG PO TABS
5.0000 mg | ORAL_TABLET | Freq: Every day | ORAL | Status: DC
  Administered 2016-02-01 – 2016-02-07 (×4): 5 mg via ORAL
  Filled 2016-02-01 (×6): qty 1

## 2016-02-01 MED ORDER — TRAZODONE HCL 50 MG PO TABS
100.0000 mg | ORAL_TABLET | Freq: Every day | ORAL | Status: DC
  Administered 2016-02-02 – 2016-02-07 (×3): 100 mg via ORAL
  Filled 2016-02-01 (×7): qty 2

## 2016-02-01 MED ORDER — ADULT MULTIVITAMIN W/MINERALS CH
1.0000 | ORAL_TABLET | Freq: Every day | ORAL | Status: DC
  Administered 2016-02-01 – 2016-02-07 (×7): 1 via ORAL
  Filled 2016-02-01 (×7): qty 1

## 2016-02-01 NOTE — ED Triage Notes (Signed)
Per EMS- [place] bus [place] and called EMS for c/o SP and SOB. Patient reports a history of blood clots. Patient states he has not been on Xarelto x 1 year. Patient also c/o right leg pain.Right leg with + pedal pulse and no swelling. Lungs clear.

## 2016-02-01 NOTE — ED Notes (Signed)
Ultrasound at the bedside

## 2016-02-01 NOTE — Progress Notes (Signed)
VASCULAR LAB PRELIMINARY  PRELIMINARY  PRELIMINARY  PRELIMINARY  Bilateral lower extremity venous duplex completed.    Preliminary report:  There is extensive, non occluisve,highly acute DVT noted in the left common femoral, femoral, popliteal, posterior tibial, and peroneal veins. The thrombus is mobile in the left common femoral vein. There is acute, non occlusive DVT noted in the right femoral, femoral and popliteal veins.    Gave report to Dr. Verdie MosherLiu.  Griselda Bramblett, RVT 02/01/2016, 5:18 PM

## 2016-02-01 NOTE — Progress Notes (Signed)
ANTICOAGULATION CONSULT NOTE - Initial Consult  Pharmacy Consult for Heparin Indication: DVT  Allergies  Allergen Reactions  . Pork-Derived Products Nausea And Vomiting    Patient Measurements: Height: 5\' 7"  (170.2 cm) Weight: 150 lb (68 kg) IBW/kg (Calculated) : 66.1 Heparin Dosing Weight: actual weight  Vital Signs: Temp: 97.9 F (36.6 C) (11/19 1303) Temp Source: Oral (11/19 1303) BP: 119/78 (11/19 1754) Pulse Rate: 50 (11/19 1754)  Labs:  Recent Labs  02/01/16 1400  HGB 12.5*  HCT 38.3*  PLT 233  CREATININE 1.20    Estimated Creatinine Clearance: 67.3 mL/min (by C-G formula based on SCr of 1.2 mg/dL).   Medical History: Past Medical History:  Diagnosis Date  . DVT of lower extremity (deep venous thrombosis) (HCC)   . PE (pulmonary embolism)     Medications:  Infusions:  . heparin 1,250 Units/hr (02/01/16 1901)   Assessment: [age] yoM presents to ED on 11/19 with acute DVT.  PMH includes DVT/PE, Protein C deficiency, but has not been on Xarelto for 1 year.  No PTA anticoagulants noted.  Pharmacy is consulted to dose heparin drip.  Today, 02/01/2016: Baseline coags: INR 1.03, APTT in process CBC:  Hgb 12.5, Plt 233 SCr 1.2 with CrCl ~ 67 ml/min  Goal of Therapy:  Heparin level 0.3-0.7 units/ml Monitor platelets by anticoagulation protocol: Yes   Plan:   Baseline PTT, PT/INR  Give heparin 2050 units bolus IV x 1  Start heparin IV infusion at 1250 units/hr  Heparin level 6 hours after starting  Daily heparin level and CBC  Lynann Beaverhristine Zac Torti PharmD, BCPS Pager [phone] 02/01/2016 6:13 PM

## 2016-02-01 NOTE — ED Notes (Signed)
Pt aware urine is needed. Pt given urinal.

## 2016-02-01 NOTE — H&P (Addendum)
Peter Washington BJY:[id number] DOB: 07/01/1965 DOA: 02/01/2016     PCP: No primary care provider on file.   Outpatient Specialists: Oncology and remote  past [place]   Patient coming from:  home Lives  alone   Chief Complaint: Leg swelling, pleuritic chest pain shortness of breath  HPI: Peter Washington is a 50 y.o. 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 He has known history of DVTs and PEs but has not been taking his xarelto for the past 1 year had leg swelling and pain right worse than left. Denies any fevers or chills Describes chest pain and sharp constant worse with inspiration also worse with any movement and reproducible palpation. He had prior pain in the past similar to this and felt to be secondary to PE. Patient reports tenderness to his coughs bilaterally. He denies any recent travel but has been recently hospitalized no nausea no vomiting no diarrhea associated this. Reports not taking any of his medications for the past 1 week. No melena no blood in stools. No travel hx.    Regarding pertinent Chronic problems: Patient recently has been admitted to behavioral health follow cocaine dependence   IN ER:  Temp (24hrs), Avg:97.9 F (36.6 C), Min:97.9 F (36.6 C), Max:97.9 F (36.6 C)      RR 20 Oxygen 100% HR 50 BP 119/78 Troponin 0 Creatinine 1.2 WBC 6.6 hemoglobin 12.5  Dopplers done showing extensive nonocclusive highly acute DVT in the left common femoral, femoral popliteal posterior tibial and peroneal veins thrombus is mobile on the left common femoral vein acute nonocclusive DVT noted in the right femoral and femoral popliteal veins     CT chest negative for   PE  Following Medications were ordered in ER: Medications  sodium chloride 0.9 % injection (not administered)  iopamidol (ISOVUE-370) 76 % injection (not administered)  heparin bolus via infusion 2,050 Units (not administered)  heparin ADULT infusion 100 units/mL (25000 units/25350mL sodium chloride 0.45%) (not administered)  fentaNYL (SUBLIMAZE) injection 75 mcg (75 mcg Intravenous Given 02/01/16 1447)  iopamidol (ISOVUE-370) 76 % injection 100 mL (100 mLs Intravenous Contrast Given 02/01/16 1553)  ketorolac (TORADOL) 30 MG/ML injection 30 mg (30 mg Intravenous Given 02/01/16 1723)      Hospitalist was called for admission for Bilateral DVTs with high risk for instability  Review of Systems:    Pertinent positives include: chest pain,  shortness of breath at rest.  dyspnea on exertion  Constitutional:  No weight loss, night sweats, Fevers, chills, fatigue, weight loss  HEENT:  No headaches, Difficulty swallowing,Tooth/dental problems,Sore throat,  No sneezing, itching, ear ache, nasal congestion, post nasal drip,  Cardio-vascular:  No Orthopnea, PND, anasarca, dizziness, palpitations.no Bilateral lower extremity swelling  GI:  No heartburn, indigestion, abdominal pain, nausea, vomiting, diarrhea, change in bowel habits, loss of appetite, melena, blood in stool, hematemesis Resp:  no, No excess mucus, no productive cough, No non-productive cough, No coughing up of blood.No change in color of mucus.No wheezing. Skin:  no rash or lesions. No jaundice GU:  no dysuria, change in color of urine, no urgency or frequency. No straining to urinate.  No flank pain.  Musculoskeletal:  No joint pain or no joint  swelling. No decreased range of motion. No back pain.  Psych:  No change in mood or affect. No depression or anxiety. No memory loss.  Neuro: no localizing neurological complaints, no tingling, no weakness, no double vision, no gait abnormality, no slurred speech, no confusion  As  per HPI otherwise 10 point review of systems negative.   Past Medical History: Past Medical History:  Diagnosis Date  . DVT of lower extremity (deep venous thrombosis) (HCC)   . PE (pulmonary embolism)    History reviewed. No pertinent surgical history.   Social History:  Ambulatoryindependently      reports that he has been smoking Cigarettes.  He has been smoking about 0.25 packs per day. He has never used smokeless tobacco. He reports that he does not drink alcohol or use drugs.  Allergies:   Allergies  Allergen Reactions  . Pork-Derived Products Nausea And Vomiting     Family History:   Family History  Problem Relation Age of Onset  . Diabetes Neg Hx   . Stroke Neg Hx   . Cancer Neg Hx   . Clotting disorder Neg Hx     Medications: Prior to Admission medications   Medication Sig Start Date End Date Taking? Authorizing Provider  ARIPiprazole (ABILIFY) 5 MG tablet Take 1 tablet (5 mg total) by mouth daily. 01/20/16  Yes Adonis Brook, NP  citalopram (CELEXA) 20 MG tablet Take 1 tablet (20 mg total) by mouth at bedtime. 01/19/16  Yes Adonis Brook, NP  tamsulosin (FLOMAX) 0.4 MG CAPS capsule Take 1 capsule (0.4 mg total) by mouth daily after supper. 01/19/16  Yes Adonis Brook, NP  traZODone (DESYREL) 100 MG tablet Take 1 tablet (100 mg total) by mouth at bedtime. 01/19/16  Yes Adonis Brook, NP    Physical Exam: Patient Vitals for the past 24 hrs:  BP Temp Temp src Pulse Resp SpO2 Height Weight  02/01/16 1754 119/78 - - (!) 50 20 100 % - -  02/01/16 1640 111/77 - - (!) 53 17 100 % - -  02/01/16 1500 120/73 - - (!) 57 16 100 % - -  02/01/16 1400 118/75 - - (!) 48 17 100 % - -  02/01/16 1307 - - - - - - 5\' 7"  (1.702 m) 68 kg (150 lb)  02/01/16 1303 124/76 97.9 F (36.6 C) Oral (!) 56 11 100 % - -  02/01/16 1301 - - - - - 99 % - -    1. General:  in No Acute distress 2. Psychological: Alert and  Oriented 3. Head/ENT:     Dry Mucous Membranes                           Head Non traumatic, neck supple                          Poor Dentition 4. SKIN:   decreased Skin turgor,  Skin clean Dry and intact no rash 5. Heart: Regular rate and rhythm no  Murmur, Rub or gallop 6. Lungs:   no wheezes or crackles   7. Abdomen: Soft,  non-tender, Non distended 8. Lower extremities: no clubbing, cyanosis, trace edema 9. Neurologically Grossly intact, moving all 4 extremities equally  10. MSK: Normal range of motion   body mass index is 23.49 kg/m.  Labs on Admission:   Labs on Admission: I have personally reviewed following labs and imaging studies  CBC:  Recent Labs Lab 02/01/16 1400  WBC 6.6  HGB 12.5*  HCT 38.3*  MCV 80.5  PLT 233   Basic Metabolic Panel:  Recent Labs Lab 02/01/16 1400  NA 139  K 3.8  CL 106  CO2 29  GLUCOSE 88  BUN 17  CREATININE 1.20  CALCIUM 8.8*   GFR: Estimated Creatinine Clearance: 67.3 mL/min (by C-G formula based on SCr of 1.2 mg/dL). Liver Function Tests: No results for input(s): AST, ALT, ALKPHOS, BILITOT, PROT, ALBUMIN in the last 168 hours. No results for input(s): LIPASE, AMYLASE in the last 168 hours. No results for input(s): AMMONIA in the last 168 hours. Coagulation Profile: No results for input(s): INR, PROTIME in the last 168 hours. Cardiac Enzymes: No results for input(s): CKTOTAL, CKMB, CKMBINDEX, TROPONINI in the last 168 hours. BNP (last 3 results) No results for input(s): PROBNP in the last 8760 hours. HbA1C: No results for input(s): HGBA1C in the last 72 hours. CBG: No results for input(s): GLUCAP in the last 168 hours. Lipid Profile: No results for input(s): CHOL, HDL, LDLCALC, TRIG, CHOLHDL, LDLDIRECT in the last 72 hours. Thyroid Function Tests: No results for input(s): TSH, T4TOTAL, FREET4, T3FREE, THYROIDAB in the last 72 hours. Anemia Panel: No results for input(s): VITAMINB12, FOLATE, FERRITIN, TIBC, IRON, RETICCTPCT in the last 72 hours.  Sepsis  Labs: @LABRCNTIP (procalcitonin:4,lacticidven:4) )No results found for this or any previous visit (from the past 240 hour(s)).     UA  not ordered  Lab Results  Component Value Date   HGBA1C 5.5 01/13/2016    Estimated Creatinine Clearance: 67.3 mL/min (by C-G formula based on SCr of 1.2 mg/dL).  BNP (last 3 results) No results for input(s): PROBNP in the last 8760 hours.   ECG REPORT  Independently reviewed Rate:53  Rhythm:   sinus bradycardia ST&T Change: No acute ischemic changes   QTC 383  Filed Weights   02/01/16 1307  Weight: 68 kg (150 lb)     Cultures:    Component Value Date/Time   SDES THROAT 04/22/2015 0112   SPECREQUEST NONE Reflexed from [id number] 04/22/2015 0112   CULT NO GROUP A STREP (S.PYOGENES) ISOLATED 04/22/2015 0112   REPTSTATUS 04/24/2015 FINAL 04/22/2015 0112     Radiological Exams on Admission: Dg Chest 2 View  Result Date: 02/01/2016 CLINICAL DATA:  Shortness of breath and chest pain for 2 days. EXAM: CHEST  2 VIEW COMPARISON:  04/21/2015 and prior chest radiographs FINDINGS: The cardiomediastinal silhouette is unremarkable. There is no evidence of focal airspace disease, pulmonary edema, suspicious pulmonary nodule/mass, pleural effusion, or pneumothorax. No acute bony abnormalities are identified. IMPRESSION: No active cardiopulmonary disease. Electronically Signed   By: Harmon PierJeffrey  Hu M.D.   On: 02/01/2016 14:24   Ct Angio Chest Pe W And/or Wo Contrast  Result Date: 02/01/2016 CLINICAL DATA:  Chest pain and bilateral calf tenderness. History of deep venous thrombosis and pulmonary embolus. Symptoms began 2 days ago. EXAM: CT ANGIOGRAPHY CHEST WITH CONTRAST TECHNIQUE: Multidetector CT imaging of the chest was performed using the standard protocol during bolus administration of intravenous contrast. Multiplanar CT image reconstructions and MIPs were obtained to evaluate the vascular anatomy. CONTRAST:  100 cc Isovue 370. COMPARISON:  CT chest  01/09/2016. FINDINGS: Cardiovascular: No pulmonary embolus is identified. Heart size is normal. No pericardial effusion. Mediastinum/Nodes: Small calcified right hilar lymph nodes are noted. Otherwise negative. Lungs/Pleura: No pleural effusion. There is some dependent atelectasis bilaterally. Airways are unremarkable. Upper Abdomen: Negative. Musculoskeletal: Scattered thoracic spondylosis is  identified. No lytic or sclerotic lesion. Review of the MIP images confirms the above findings. IMPRESSION: Negative for pulmonary embolus.  No acute disease. Electronically Signed   By: Drusilla Kanner M.D.   On: 02/01/2016 16:08    Chart has been reviewed    Assessment/Plan  50 y.o. 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 DVT (deep venous thrombosis) (HCC) currently on heparin drip for blood clot stabilization given mobility of all visualized blood clot. Case management consult to help with financial decision-making regarding the type of chronic anticoagulation . 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 Polysubstance (including opioids) dependence with physiol dependence (HCC) currently denies cocaine abuse . Pleuritic chest pain appears to be more of a musculoskeletal after examination  Alcohol abuse currently denies severe use. We'll monitor for any sign of withdrawal order CIWA protocol  . Tobacco use mild recommended tobacco cessation  Other plan as per orders.  DVT prophylaxis:  Heparin drip  Code Status:  FULL CODE as per patient    Family Communication:   Family not  at  Bedside    Disposition Plan:    To home once workup is complete and patient is stable                           Case management consulted                          Consults called: none    Admission status:    obs   Level of care   tele       I have spent a total of 56 min on this admission    Malasha Kleppe 02/01/2016, 9:36 PM    [facility]  Pager [phone]   after 2 AM please page floor coverage PA If 7AM-7PM, please contact the day team taking care of the patient  [email or web address]  Password [email or web address]

## 2016-02-01 NOTE — ED Provider Notes (Signed)
[facility]-EMERGENCY DEPT Provider Note   CSN: [id number] Arrival date & time: 02/01/16  1254     History   Chief Complaint Chief Complaint  Patient presents with  . Shortness of Breath  . Chest Pain    HPI Peter Washington is a 50 y.o. male.  HPI  50 year old male who presents with chest pain and bilateral calf tenderness. History of DVT/PE, Protein C deficiency, not on anticoagulation. Recent psych admission in October for polysubstance abuse. States otherwise been in his usual state of health and over the past 2 days he has had constant left-sided sharp chest pain that is worse with deep inspiration. Also worse with movement and palpation, but reports that this is reminiscent of his prior PE. History of bilateral calf tenderness during this time as well. Pain not exacerbated by exertional activity, and has not had trauma or heavy lifting. No cough, fevers, syncope or near syncope. Aside from recent hospitalization, no recent immobilization. Feels bilateral lower calves as slightly more swollen than baseline. No abd pain, n/v/d.  Past Medical History:  Diagnosis Date  . DVT of lower extremity (deep venous thrombosis) (HCC)   . PE (pulmonary embolism)     Patient Active Problem List   Diagnosis Date Noted  . Cocaine dependence, continuous (HCC) 01/19/2016  . 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 Polysubstance (including opioids) dependence with physiol dependence (HCC) 01/11/2016  . Pulmonary emboli (HCC) 01/16/2014  . Tobacco use 01/16/2014  . Multiple pulmonary emboli (HCC) 01/16/2014  . Protein C deficiency (HCC) 12/31/2013  . Acute DVT (deep venous thrombosis) (HCC) 12/29/2013  . Pleuritic chest pain 12/29/2013  . Dyspnea 12/29/2013  . Chest pain 12/29/2013    History reviewed. No pertinent surgical history.     Home Medications    Prior to  Admission medications   Medication Sig Start Date End Date Taking? Authorizing Provider  ARIPiprazole (ABILIFY) 5 MG tablet Take 1 tablet (5 mg total) by mouth daily. 01/20/16  Yes Adonis BrookSheila Agustin, NP  citalopram (CELEXA) 20 MG tablet Take 1 tablet (20 mg total) by mouth at bedtime. 01/19/16  Yes Adonis BrookSheila Agustin, NP  tamsulosin (FLOMAX) 0.4 MG CAPS capsule Take 1 capsule (0.4 mg total) by mouth daily after supper. 01/19/16  Yes Adonis BrookSheila Agustin, NP  traZODone (DESYREL) 100 MG tablet Take 1 tablet (100 mg total) by mouth at bedtime. 01/19/16  Yes Adonis BrookSheila Agustin, NP    Family History Family History  Problem Relation Age of Onset  . Family history unknown: Yes    Social History Social History  Substance Use Topics  . Smoking status: Light Tobacco Smoker    Packs/day: 0.25    Types: Cigarettes  . Smokeless tobacco: Never Used  . Alcohol use No     Comment: no alcohol x 3 weeks     Allergies   Pork-derived products   Review of Systems Review of Systems 10/14 systems reviewed and are negative other than those stated in the HPI   Physical Exam Updated Vital Signs BP 119/78 (BP Location: Left Arm)   Pulse (!) 50   Temp 97.9 F (36.6 C) (Oral)   Resp 20   Ht 5\' 7"  (1.702 m)   Wt 150 lb (68 kg)   SpO2 100%   BMI 23.49 kg/m   Physical Exam Physical Exam  Nursing note and vitals reviewed. Constitutional: Well developed, well nourished, non-toxic, and in no acute  distress Head: Normocephalic and atraumatic.  Mouth/Throat: Oropharynx is clear and moist.  Neck: Normal range of motion. Neck supple.  Cardiovascular: Normal rate and regular rhythm.   Pulmonary/Chest: Effort normal and breath sounds normal.  left anterior chest wall tenderness to palpation Abdominal: Soft. There is no tenderness. There is no rebound and no guarding.  Musculoskeletal: Normal range of motion.  no appreciable edema. Bilateral calf tenderness to palpation  Neurological: Alert, no facial droop, fluent  speech, moves all extremities symmetrically Skin: Skin is warm and dry.  Psychiatric: Cooperative   ED Treatments / Results  Labs (all labs ordered are listed, but only abnormal results are displayed) Labs Reviewed  BASIC METABOLIC PANEL - Abnormal; Notable for the following:       Result Value   Calcium 8.8 (*)    Anion gap 4 (*)    All other components within normal limits  CBC - Abnormal; Notable for the following:    Hemoglobin 12.5 (*)    HCT 38.3 (*)    All other components within normal limits  APTT  PROTIME-INR  HEPARIN LEVEL (UNFRACTIONATED)  CBC  I-STAT TROPOININ, ED  I-STAT TROPOININ, ED    EKG  EKG Interpretation  Date/Time:  Sunday February 01 2016 13:02:47 EST Ventricular Rate:  55 PR Interval:    QRS Duration: 73 QT Interval:  404 QTC Calculation: 387 R Axis:   102 Text Interpretation:  Sinus rhythm Right axis deviation Nonspecific T wave abnormality Confirmed by Denton LankSTEINL  MD, Caryn BeeKEVIN ([id number]) on 02/01/2016 1:23:03 PM       Radiology Dg Chest 2 View  Result Date: 02/01/2016 CLINICAL DATA:  Shortness of breath and chest pain for 2 days. EXAM: CHEST  2 VIEW COMPARISON:  04/21/2015 and prior chest radiographs FINDINGS: The cardiomediastinal silhouette is unremarkable. There is no evidence of focal airspace disease, pulmonary edema, suspicious pulmonary nodule/mass, pleural effusion, or pneumothorax. No acute bony abnormalities are identified. IMPRESSION: No active cardiopulmonary disease. Electronically Signed   By: Harmon PierJeffrey  Hu M.D.   On: 02/01/2016 14:24   Ct Angio Chest Pe W And/or Wo Contrast  Result Date: 02/01/2016 CLINICAL DATA:  Chest pain and bilateral calf tenderness. History of deep venous thrombosis and pulmonary embolus. Symptoms began 2 days ago. EXAM: CT ANGIOGRAPHY CHEST WITH CONTRAST TECHNIQUE: Multidetector CT imaging of the chest was performed using the standard protocol during bolus administration of intravenous contrast. Multiplanar CT  image reconstructions and MIPs were obtained to evaluate the vascular anatomy. CONTRAST:  100 cc Isovue 370. COMPARISON:  CT chest 01/09/2016. FINDINGS: Cardiovascular: No pulmonary embolus is identified. Heart size is normal. No pericardial effusion. Mediastinum/Nodes: Small calcified right hilar lymph nodes are noted. Otherwise negative. Lungs/Pleura: No pleural effusion. There is some dependent atelectasis bilaterally. Airways are unremarkable. Upper Abdomen: Negative. Musculoskeletal: Scattered thoracic spondylosis is identified. No lytic or sclerotic lesion. Review of the MIP images confirms the above findings. IMPRESSION: Negative for pulmonary embolus.  No acute disease. Electronically Signed   By: Drusilla Kannerhomas  Dalessio M.D.   On: 02/01/2016 16:08    Procedures Procedures (including critical care time) CRITICAL CARE Performed by: Lavera Guiseana Duo Yonatan Guitron   Total critical care time: 35 minutes  Critical care time was exclusive of separately billable procedures and treating other patients.  Critical care was necessary to treat or prevent imminent or life-threatening deterioration.  Critical care was time spent personally by me on the following activities: development of treatment plan with patient and/or surrogate as well as nursing, discussions with consultants,  evaluation of patient's response to treatment, examination of patient, obtaining history from patient or surrogate, ordering and performing treatments and interventions, ordering and review of laboratory studies, ordering and review of radiographic studies, pulse oximetry and re-evaluation of patient's condition.  Medications Ordered in ED Medications  sodium chloride 0.9 % injection (not administered)  iopamidol (ISOVUE-370) 76 % injection (not administered)  heparin bolus via infusion 2,050 Units (not administered)  heparin ADULT infusion 100 units/mL (25000 units/231mL sodium chloride 0.45%) (not administered)  fentaNYL (SUBLIMAZE) injection  75 mcg (75 mcg Intravenous Given 02/01/16 1447)  iopamidol (ISOVUE-370) 76 % injection 100 mL (100 mLs Intravenous Contrast Given 02/01/16 1553)  ketorolac (TORADOL) 30 MG/ML injection 30 mg (30 mg Intravenous Given 02/01/16 1723)     Initial Impression / Assessment and Plan / ED Course  I have reviewed the triage vital signs and the nursing notes.  Pertinent labs & imaging results that were available during my care of the patient were reviewed by me and considered in my medical decision making (see chart for details).  Clinical Course    History of protein C deficiency and prior history of DVT/PE who presents with bilateral calf tenderness and pleuritic chest pain. There is not tachycardic, hypoxic, or tachypneic. EKG without evidence of heart strain or ischemic changes. Troponins negative. Chest x-ray showed no acute cardiopulmonary processes upon visualization. Pain also reproducible with palpation over the chest wall. Seems atypical for that of ACS. High risk for recurrent PE, and underwent CT chest. This is visualized and does not reveal PE.  He did also undergo bilateral lower extremity ultrasound. He has bilateral DVTs, left worse than right. Left-sided DVTs involving most of the deep venous symptoms symptoms, is nonocclusive, and noted to be unstable. Given the unstable nature of his DVTs, started on heparin drip. He will be admitted for observation to make sure that he does not have increased PE burden from his unstable DVTs.   Final Clinical Impressions(s) / ED Diagnoses   Final diagnoses:  Lower leg DVT (deep venous thromboembolism), acute, bilateral (HCC)    New Prescriptions New Prescriptions   No medications on file     Lavera Guise, MD 02/01/16 1857

## 2016-02-01 NOTE — ED Notes (Signed)
Pt would like labs pulled from IV.  RN aware.  

## 2016-02-02 ENCOUNTER — Other Ambulatory Visit: Payer: Self-pay

## 2016-02-02 DIAGNOSIS — D6851 Activated protein C resistance: Secondary | ICD-10-CM

## 2016-02-02 DIAGNOSIS — I82403 Acute embolism and thrombosis of unspecified deep veins of lower extremity, bilateral: Secondary | ICD-10-CM

## 2016-02-02 DIAGNOSIS — I824Z2 Acute embolism and thrombosis of unspecified deep veins of left distal lower extremity: Secondary | ICD-10-CM

## 2016-02-02 DIAGNOSIS — F1410 Cocaine abuse, uncomplicated: Secondary | ICD-10-CM

## 2016-02-02 DIAGNOSIS — Z720 Tobacco use: Secondary | ICD-10-CM

## 2016-02-02 DIAGNOSIS — I82409 Acute embolism and thrombosis of unspecified deep veins of unspecified lower extremity: Secondary | ICD-10-CM | POA: Diagnosis present

## 2016-02-02 DIAGNOSIS — Z7901 Long term (current) use of anticoagulants: Secondary | ICD-10-CM

## 2016-02-02 DIAGNOSIS — F32A Depression, unspecified: Secondary | ICD-10-CM

## 2016-02-02 DIAGNOSIS — F329 Major depressive disorder, single episode, unspecified: Secondary | ICD-10-CM

## 2016-02-02 DIAGNOSIS — F1420 Cocaine dependence, uncomplicated: Secondary | ICD-10-CM

## 2016-02-02 LAB — CBC
HEMATOCRIT: 35 % — AB (ref 39.0–52.0)
Hemoglobin: 11.6 g/dL — ABNORMAL LOW (ref 13.0–17.0)
MCH: 26.5 pg (ref 26.0–34.0)
MCHC: 33.1 g/dL (ref 30.0–36.0)
MCV: 80.1 fL (ref 78.0–100.0)
Platelets: 207 10*3/uL (ref 150–400)
RBC: 4.37 MIL/uL (ref 4.22–5.81)
RDW: 15.1 % (ref 11.5–15.5)
WBC: 5.2 10*3/uL (ref 4.0–10.5)

## 2016-02-02 LAB — COMPREHENSIVE METABOLIC PANEL
ALBUMIN: 3.4 g/dL — AB (ref 3.5–5.0)
ALK PHOS: 46 U/L (ref 38–126)
ALT: 16 U/L — AB (ref 17–63)
AST: 17 U/L (ref 15–41)
Anion gap: 5 (ref 5–15)
BILIRUBIN TOTAL: 0.6 mg/dL (ref 0.3–1.2)
BUN: 24 mg/dL — ABNORMAL HIGH (ref 6–20)
CALCIUM: 8.3 mg/dL — AB (ref 8.9–10.3)
CO2: 25 mmol/L (ref 22–32)
CREATININE: 1.15 mg/dL (ref 0.61–1.24)
Chloride: 108 mmol/L (ref 101–111)
GFR calc non Af Amer: >60 mL/min (ref >60)
GLUCOSE: 137 mg/dL — AB (ref 65–99)
Potassium: 3.6 mmol/L (ref 3.5–5.1)
SODIUM: 138 mmol/L (ref 135–145)
Total Protein: 5.7 g/dL — ABNORMAL LOW (ref 6.5–8.1)

## 2016-02-02 LAB — HIV ANTIBODY (ROUTINE TESTING W REFLEX): HIV Screen 4th Generation wRfx: NONREACTIVE

## 2016-02-02 LAB — MAGNESIUM: Magnesium: 2.2 mg/dL (ref 1.7–2.4)

## 2016-02-02 LAB — HEPARIN LEVEL (UNFRACTIONATED)
Heparin Unfractionated: 0.34 IU/mL (ref 0.30–0.70)
Heparin Unfractionated: 0.38 IU/mL (ref 0.30–0.70)

## 2016-02-02 LAB — PHOSPHORUS: Phosphorus: 4.7 mg/dL — ABNORMAL HIGH (ref 2.5–4.6)

## 2016-02-02 LAB — TSH: TSH: 1.347 u[IU]/mL (ref 0.350–4.500)

## 2016-02-02 MED ORDER — DICLOFENAC SODIUM 1 % TD GEL
2.0000 g | Freq: Four times a day (QID) | TRANSDERMAL | Status: DC
  Administered 2016-02-06 – 2016-02-07 (×4): 2 g via TOPICAL
  Filled 2016-02-02: qty 100

## 2016-02-02 MED ORDER — SODIUM CHLORIDE 0.9 % IV SOLN
INTRAVENOUS | Status: DC
  Administered 2016-02-02 – 2016-02-03 (×3): via INTRAVENOUS

## 2016-02-02 MED ORDER — KETOROLAC TROMETHAMINE 30 MG/ML IJ SOLN
30.0000 mg | Freq: Four times a day (QID) | INTRAMUSCULAR | Status: AC | PRN
  Administered 2016-02-02: 30 mg via INTRAVENOUS
  Filled 2016-02-02: qty 1

## 2016-02-02 MED ORDER — IBUPROFEN 200 MG PO TABS: 400.0000 mg | ORAL_TABLET | ORAL | Status: DC | PRN

## 2016-02-02 MED ORDER — NICOTINE 7 MG/24HR TD PT24
7.0000 mg | MEDICATED_PATCH | Freq: Every day | TRANSDERMAL | Status: DC
  Filled 2016-02-02 (×5): qty 1

## 2016-02-02 NOTE — Progress Notes (Signed)
PHARMACY - HEPARIN  Patient on IV heparin for bilateral DVT  Heparin infusing @ 1250 units/hr  Heparin level = 0.34 (Goal 0.3-0.7) H/H = 11.6/35 and PLTC = 207 No complications of therapy noted  PLAN: Continue IV heparin @ 1250 units/hr Recheck heparin level in 6 hr to confirm therapeutic dose  Terrilee FilesLeann Lyndon Chapel, PharmD 02/02/16 @ 01:18

## 2016-02-02 NOTE — Progress Notes (Signed)
Clinical Social Work consult: referred for medication assistance - "He has problems getting his medications"  Deferred to Case Management at this time - RNCM, Cookie made aware.   Inappropriate CSW referral.  If CSW needs arise, please re-consult.   Lincoln MaxinKelly Chesnee Floren, LCSW [facility] Clinical Social Worker cell #: [phone]

## 2016-02-02 NOTE — Progress Notes (Addendum)
PROGRESS NOTE    Peter Washington  YNW:[id number] DOB: 05/26/1965 DOA: 02/01/2016  PCP: No primary care provider on fMelton Krebsile.   Brief Narrative:  Peter Washington is a 50 y.o. 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 He has known history of DVTs and PEs but has not been taking his xarelto for the past 1 year had leg swelling and pain right worse than left. Denies any fevers or chills Describes chest pain and sharp constant worse with inspiration also worse with any movement and reproducible palpation. He had prior pain in the past similar to this and felt to be secondary to PE. Patient reports tenderness to his coughs bilaterally. He denies any recent travel but has been recently hospitalized no nausea no vomiting no diarrhea associated this. Reports not taking any of his medications for the past 1 week. No melena no blood in stools. No travel hx.   Subjective: Continues to have left sided chest pain similar to his last PE. Tender to palpation. No other complaints. States last PE was treated with Lovenox and then Xarelto.   Assessment & Plan:   Principal Problem:   Acute DVT (deep venous thrombosis)  / Protein C deficiency   - cont Heparin for now- will need life long anticoagulation as this is his 2nd episode - have consulted hematology- Dr Myna HidalgoEnnever will see him tomorrow - has mobile thrombus, cont bedrest for now -   Active Problems:   Chest pain - no PE on imaging - muscular- start Voltaren gel, Motrin and Toradol PRN     Tobacco use - Nicotine patch  Dehydration - elevated BUN/ Cr ratio- started NS IVF    Cocaine dependence, continuous  - advised to discontinue    Depression -  Abilify, Celexa, Trazodone [facility]  BPH - Flomax  DVT prophylaxis: Heparin infusion Code Status: Full code Family Communication:  Disposition Plan: home in 2-3 days Consultants:   hematology Procedures:    Antimicrobials:  Anti-infectives    None       Objective: Vitals:   02/01/16 2100 02/01/16 2104 02/01/16 2144 02/02/16 0100  BP: 123/69 123/69 115/62 (!) 95/48  Pulse: (!) 59 62 61 65  Resp: 14 20 20 20   Temp:   98 F (36.7 C) 98 F (36.7 C)  TempSrc:   Oral Oral  SpO2: 99% 99% 99% 99%  Weight:   68.1 kg (150 lb 1.6 oz)   Height:   5\' 7"  (1.702 m)     Intake/Output Summary (Last 24 hours) at 02/02/16 1310 Last data filed at 02/02/16 1100  Gross per 24 hour  Intake             1450 ml  Output                0 ml  Net             1450 ml   Filed Weights   02/01/16 1307 02/01/16 2144  Weight: 68 kg (150 lb) 68.1 kg (150 lb 1.6 oz)    Examination: General exam: Appears comfortable  HEENT: PERRLA, oral mucosa moist, no sclera icterus or thrush Respiratory system: Clear to auscultation. Respiratory effort normal. Chest: tender to palpation  in left chest wall Cardiovascular system: S1 & S2 heard, RRR.  No murmurs  Gastrointestinal system: Abdomen soft, non-tender, nondistended. Normal bowel sound. No organomegaly Central nervous system: Alert and oriented. No focal neurological deficits. Extremities: No cyanosis, clubbing or edema Skin: No rashes or ulcers Psychiatry:  Mood & affect appropriate.     Data Reviewed: I have personally reviewed following labs and imaging studies  CBC:  Recent Labs Lab 02/01/16 1400 02/02/16 0047  WBC 6.6 5.2  HGB 12.5* 11.6*  HCT 38.3* 35.0*  MCV 80.5 80.1  PLT 233 207   Basic Metabolic Panel:  Recent Labs Lab 02/01/16 1400 02/01/16 1615 02/02/16 0047  NA 139  --  138  K 3.8  --  3.6  CL 106  --  108  CO2 29  --  25  GLUCOSE 88  --  137*  BUN 17  --  24*  CREATININE 1.20  --  1.15  CALCIUM  8.8*  --  8.3*  MG  --  2.2 2.2  PHOS  --  3.3 4.7*   GFR: Estimated Creatinine Clearance: 70.3 mL/min (by C-G formula based on SCr of 1.15 mg/dL). Liver Function Tests:  Recent Labs Lab 02/01/16 2000 02/02/16 0047  AST 22 17  ALT 20 16*  ALKPHOS 48 46  BILITOT 0.9 0.6  PROT 6.8 5.7*  ALBUMIN 3.9 3.4*   No results for input(s): LIPASE, AMYLASE in the last 168 hours. No results for input(s): AMMONIA in the last 168 hours. Coagulation Profile:  Recent Labs Lab 02/01/16 1811  INR 1.03   Cardiac Enzymes: No results for input(s): CKTOTAL, CKMB, CKMBINDEX, TROPONINI in the last 168 hours. BNP (last 3 results) No results for input(s): PROBNP in the last 8760 hours. HbA1C: No results for input(s): HGBA1C in the last 72 hours. CBG: No results for input(s): GLUCAP in the last 168 hours. Lipid Profile: No results for input(s): CHOL, HDL, LDLCALC, TRIG, CHOLHDL, LDLDIRECT in the last 72 hours. Thyroid Function Tests:  Recent Labs  02/02/16 0047  TSH 1.347   Anemia Panel: No results for input(s): VITAMINB12, FOLATE, FERRITIN, TIBC, IRON, RETICCTPCT in the last 72 hours. Urine analysis:    Component Value Date/Time   COLORURINE YELLOW 01/18/2016 1150   APPEARANCEUR CLEAR 01/18/2016 1150   LABSPEC 1.011 01/18/2016 1150   PHURINE 7.0 01/18/2016 1150   GLUCOSEU NEGATIVE 01/18/2016 1150   HGBUR NEGATIVE 01/18/2016 1150   BILIRUBINUR NEGATIVE 01/18/2016 1150   KETONESUR NEGATIVE 01/18/2016 1150   PROTEINUR NEGATIVE 01/18/2016 1150   UROBILINOGEN >8.0 (H) 12/24/2014 2020   NITRITE NEGATIVE 01/18/2016 1150   LEUKOCYTESUR NEGATIVE 01/18/2016 1150   Sepsis Labs: @LABRCNTIP (procalcitonin:4,lacticidven:4) )No results found for this or any previous visit (from the past 240 hour(s)).       Radiology Studies: Dg Chest 2 View  Result Date: 02/01/2016 CLINICAL DATA:  Shortness of breath and chest pain for 2 days. EXAM: CHEST  2 VIEW COMPARISON:  04/21/2015 and prior chest  radiographs FINDINGS: The cardiomediastinal silhouette is unremarkable. There is no evidence of focal airspace disease, pulmonary edema, suspicious pulmonary nodule/mass, pleural effusion, or pneumothorax. No acute bony abnormalities are identified. IMPRESSION: No active cardiopulmonary disease. Electronically Signed   By: Harmon PierJeffrey  Hu M.D.   On: 02/01/2016 14:24   Ct Angio Chest Pe W And/or Wo Contrast  Result Date: 02/01/2016 CLINICAL DATA:  Chest pain and bilateral calf tenderness. History of deep venous thrombosis and pulmonary embolus. Symptoms began 2 days ago. EXAM: CT ANGIOGRAPHY CHEST  WITH CONTRAST TECHNIQUE: Multidetector CT imaging of the chest was performed using the standard protocol during bolus administration of intravenous contrast. Multiplanar CT image reconstructions and MIPs were obtained to evaluate the vascular anatomy. CONTRAST:  100 cc Isovue 370. COMPARISON:  CT chest 01/09/2016. FINDINGS: Cardiovascular: No pulmonary embolus is identified. Heart size is normal. No pericardial effusion. Mediastinum/Nodes: Small calcified right hilar lymph nodes are noted. Otherwise negative. Lungs/Pleura: No pleural effusion. There is some dependent atelectasis bilaterally. Airways are unremarkable. Upper Abdomen: Negative. Musculoskeletal: Scattered thoracic spondylosis is identified. No lytic or sclerotic lesion. Review of the MIP images confirms the above findings. IMPRESSION: Negative for pulmonary embolus.  No acute disease. Electronically Signed   By: Drusilla Kanner M.D.   On: 02/01/2016 16:08      Scheduled Meds: . ARIPiprazole  5 mg Oral Daily  . citalopram  20 mg Oral QHS  . diclofenac sodium  2 g Topical QID  . folic acid  1 mg Oral Daily  . multivitamin with minerals  1 tablet Oral Daily  . [START ON 02/03/2016] nicotine  7 mg Transdermal Daily  . sodium chloride flush  3 mL Intravenous Q12H  . tamsulosin  0.4 mg Oral QPC supper  . thiamine  100 mg Oral Daily   Or  . thiamine   100 mg Intravenous Daily  . traZODone  100 mg Oral QHS   Continuous Infusions: . sodium chloride 75 mL/hr at 02/02/16 1053  . heparin 1,250 Units/hr (02/01/16 1901)     LOS: 0 days    Time spent in minutes: 35    Reinaldo Helt, MD [facility] Pager: [email or web address] Password [email or web address] 02/02/2016, 1:10 PM

## 2016-02-02 NOTE — Consult Note (Addendum)
Referral MD  Reason for Referral: Bilateral lower extremity DVT-recurrent-protein C deficiency   Chief Complaint  Patient presents with  . Shortness of Breath  . Chest Pain  : I came in because I had some chest wall pain.  HPI: Peter Washington is a very nice 50 year old African-American male. 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  He apparently carries a diagnosis of protein C deficiency. He developed a pulmonary embolus back in November 2015. At that time, he had a low protein C level. His protein C total activity level was 48%. His functional level was 26%.  He says he was on Lovenox and then Xarelto for a year. A follow-up CT angiogram in December was negative for pulmonary embolus.  He has been smoking. He smokes about half-pack per day. He unfortunately still is doing cocaine.  He came to the hospital yesterday. He had chest wall pain. CT angiogram was negative for pulmonary embolus. However, Dopplers of his leg showed bilateral lower extremity DVT. In his left leg he had a thrombus from the left common femoral down to the peroneal vein. The right leg, he has a thrombus from the femoral vein down to the peroneal vein.  He is on heparin right now.  He says that he cannot afford his medications. No surprise, he does not see a family doctor.  There is nobody in his family that has had blood clots.  He does not have diabetes. He has not had any long trips recently. He's been working. He stands a lot.  He is on heparin. He says he feels a little better. His legs don't seem to hurt as much.  He's had no prior surgeries. He says that he has had difficulty with urinating. He is on Flomax. I'm not sure how he gets his medications as an outpatient.  He's had no bleeding. He's had no cough. He's had no headache.  Overall, his performance status is ECOG 1.   Past Medical History:  Diagnosis Date  . DVT of lower extremity (deep venous thrombosis) (HCC)   . PE (pulmonary  embolism)   :  History reviewed. No pertinent surgical history.:   Current Facility-Administered Medications:  .  0.9 %  sodium chloride infusion, , Intravenous, Continuous, Calvert Cantor, MD, Last Rate: 75 mL/hr at 02/02/16 1053 .  acetaminophen (TYLENOL) tablet 650 mg, 650 mg, Oral, Q6H PRN **OR** acetaminophen (TYLENOL) suppository 650 mg, 650 mg, Rectal, Q6H PRN, Therisa Doyne, MD .  ARIPiprazole (ABILIFY) tablet 5 mg, 5 mg, Oral, Daily, Therisa Doyne, MD, 5 mg at 02/02/16 1100 .  citalopram (CELEXA) tablet 20 mg, 20 mg, Oral, QHS, Therisa Doyne, MD, 20 mg at 02/02/16 0119 .  diclofenac sodium (VOLTAREN) 1 % transdermal gel 2 g, 2 g, Topical, QID, Saima Rizwan, MD .  folic acid (FOLVITE) tablet 1 mg, 1 mg, Oral, Daily, Therisa Doyne, MD, 1 mg at 02/02/16 1100 .  heparin ADULT infusion 100 units/mL (25000 units/227mL sodium chloride 0.45%), 1,250 Units/hr, Intravenous, Continuous, Christine E Shade, RPH, Last Rate: 12.5 mL/hr at 02/02/16 1426, 1,250 Units/hr at 02/02/16 1426 .  HYDROcodone-acetaminophen (NORCO/VICODIN) 5-325 MG per tablet 1-2 tablet, 1-2 tablet, Oral, Q4H PRN, Therisa Doyne, MD, 2 tablet at 02/02/16 1808 .  ibuprofen (ADVIL,MOTRIN) tablet 400 mg, 400 mg, Oral, Q4H PRN, Calvert Cantor, MD .  ketorolac (TORADOL) 30 MG/ML injection 30 mg, 30 mg, Intravenous, Q6H PRN, Calvert Cantor, MD, 30 mg at 02/02/16 1059 .  LORazepam (ATIVAN) tablet 1 mg, 1  mg, Oral, Q6H PRN **OR** LORazepam (ATIVAN) injection 1 mg, 1 mg, Intravenous, Q6H PRN, Therisa DoyneAnastassia Doutova, MD, 1 mg at 02/01/16 2208 .  multivitamin with minerals tablet 1 tablet, 1 tablet, Oral, Daily, Therisa DoyneAnastassia Doutova, MD, 1 tablet at 02/02/16 1100 .  [START ON 02/03/2016] nicotine (NICODERM CQ - dosed in mg/24 hr) patch 7 mg, 7 mg, Transdermal, Daily, Saima Rizwan, MD .  ondansetron (ZOFRAN) tablet 4 mg, 4 mg, Oral, Q6H PRN **OR** ondansetron (ZOFRAN) injection 4 mg, 4 mg, Intravenous, Q6H PRN, Therisa DoyneAnastassia  Doutova, MD .  sodium chloride flush (NS) 0.9 % injection 3 mL, 3 mL, Intravenous, Q12H, Therisa DoyneAnastassia Doutova, MD .  tamsulosin (FLOMAX) capsule 0.4 mg, 0.4 mg, Oral, QPC supper, Therisa DoyneAnastassia Doutova, MD, 0.4 mg at 02/02/16 1808 .  thiamine (VITAMIN B-1) tablet 100 mg, 100 mg, Oral, Daily, 100 mg at 02/02/16 1100 **OR** thiamine (B-1) injection 100 mg, 100 mg, Intravenous, Daily, Therisa DoyneAnastassia Doutova, MD .  traZODone (DESYREL) tablet 100 mg, 100 mg, Oral, QHS, Therisa DoyneAnastassia Doutova, MD, 100 mg at 02/02/16 0118:  . ARIPiprazole  5 mg Oral Daily  . citalopram  20 mg Oral QHS  . diclofenac sodium  2 g Topical QID  . folic acid  1 mg Oral Daily  . multivitamin with minerals  1 tablet Oral Daily  . [START ON 02/03/2016] nicotine  7 mg Transdermal Daily  . sodium chloride flush  3 mL Intravenous Q12H  . tamsulosin  0.4 mg Oral QPC supper  . thiamine  100 mg Oral Daily   Or  . thiamine  100 mg Intravenous Daily  . traZODone  100 mg Oral QHS  :  Allergies  Allergen Reactions  . Pork-Derived Products Nausea And Vomiting  :  Family History  Problem Relation Age of Onset  . Diabetes Neg Hx   . Stroke Neg Hx   . Cancer Neg Hx   . Clotting disorder Neg Hx   :  Social History   Social History  . Marital status: Single    Spouse name: N/A  . Number of children: N/A  . Years of education: N/A   Occupational History  . Not on file.   Social History Main Topics  . Smoking status: Light Tobacco Smoker    Packs/day: 0.25    Types: Cigarettes  . Smokeless tobacco: Never Used  . Alcohol use Yes     Comment: 3 beers on weekends  . Drug use: No  . Sexual activity: Not on file   Other Topics Concern  . Not on file   Social History Narrative  . No narrative on file  :  Pertinent items are noted in HPI.  Exam: Patient Vitals for the past 24 hrs:  BP Temp Temp src Pulse Resp SpO2 Height Weight  02/02/16 1323 119/60 98.9 F (37.2 C) Oral 64 18 98 % - -  02/02/16 0100 (!) 95/48 98 F  (36.7 C) Oral 65 20 99 % - -  02/01/16 2144 115/62 98 F (36.7 C) Oral 61 20 99 % 5\' 7"  (1.702 m) 150 lb 1.6 oz (68.1 kg)  02/01/16 2104 123/69 - - 62 20 99 % - -  02/01/16 2100 123/69 - - (!) 59 14 99 % - -  02/01/16 2030 120/73 - - - 17 - - -  02/01/16 2000 113/66 - - (!) 58 16 99 % - -  02/01/16 1930 121/64 - - - 13 - - -  02/01/16 1919 116/75 - - (!) 55 20  100 % - -  02/01/16 1900 116/75 - - (!) 52 21 100 % - -    As above    Recent Labs  02/01/16 1400 02/02/16 0047  WBC 6.6 5.2  HGB 12.5* 11.6*  HCT 38.3* 35.0*  PLT 233 207    Recent Labs  02/01/16 1400 02/02/16 0047  NA 139 138  K 3.8 3.6  CL 106 108  CO2 29 25  GLUCOSE 88 137*  BUN 17 24*  CREATININE 1.20 1.15  CALCIUM 8.8* 8.3*    Blood smear review:  None  Pathology: None     Assessment and Plan:  Peter Washington is a 50 year old African-American male. He has recurrent thromboembolic disease. He carries a diagnosis of protein C deficiency. I am checking his protein C levels.  He clearly needs lifelong anticoagulation. I think that as long as he is smoking and doing recreational drugs, he is at significant risk for thromboembolism.  He does not need an IVC filter. I saw that a thrombus in his femoral vein was "mobile". I'm not really sure what this indicates.  I think that the real question is how he can get his blood thinner as an outpatient. He is on heparin right now. I would keep him on heparin as long as he is an inpatient.  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 I would put him back on Xarelto as an outpatient since this is daily dosing.  He says that if he can afford it, he will certainly take it. He is not too happy about having to take blood thinner the rest of his life but I think that he has risk factors that clearly increases risk of blood clots.  While he is in the hospital, he needs to have his prostate checked. It  sounds like he has BPH. I see that he is on Flomax.  I don't think he needs any other hypercoagulable workup. No matter what else we find, this will not change the fact that he will need lifelong anticoagulation.  I spent about an hour with him this evening. He is very nice. [place].  Christin BachPete Ennever, MD  Psalm 100:4

## 2016-02-02 NOTE — Care Management Note (Signed)
Case Management Note  Patient Details  Name: Peter Washington MRN: [id number] Date of Birth: 08/28/1965  Subjective/Objective:  Pt admitted with CCO leg swelling, pleuritic chest pain SOB.  Spoke with pt concerning PCP and medications.  [facility] him up for free Xarelto medications.  PT agreed with an appointment [facility]. Explained to pt that there is a co pay with all medications at this time. Will check to see what medication pt is being discharged on. Pt is not a pleasant person. Will continue to follow.            Action/Plan:[facility]charge [facility]   Expected Discharge Date:   02/02/2016               Expected Discharge Plan:    02/05/2016  In-House Referral:   CM  Discharge planning Services    Follow up at [facility] 03/01/2016 at 9:30 AM.   Post Acute Care Choice:    Choice offered to:     DME Arranged:    DME Agency:     HH Arranged:    HH Agency:     Status of Service:     If discussed at [employer] Meetings, dates discussed:    Additional CommentsGeni Bers:  Maryuri Warnke, RN 02/02/2016, 11:45 AM

## 2016-02-02 NOTE — Consult Note (Signed)
Hospital Consult    Reason for Consult:  dvt MRN #:  [id number]  History of Present Illness: This is a 50 y.o. male with history of protein c deficiency here with chest pain and concern for recurrent pe. Also has bilateral leg pain in the left more than right. Pain is constant and burning down the back of mostly his left leg. He has associated mild swelling and his left leg feels heavier than right although this is also chronic. He does walk and continues to work. He has not been anticoagulated the reason I gather is secondary to non-compliance. He has not had any recent surgery or bleeding although he denies ever having colonoscopy.   Past Medical History:  Diagnosis Date  . DVT of lower extremity (deep venous thrombosis) (HCC)   . PE (pulmonary embolism)     History reviewed. No pertinent surgical history.  Allergies  Allergen Reactions  . Pork-Derived Products Nausea And Vomiting    Prior to Admission medications   Medication Sig Start Date End Date Taking? Authorizing Provider  ARIPiprazole (ABILIFY) 5 MG tablet Take 1 tablet (5 mg total) by mouth daily. 01/20/16  Yes Adonis BrookSheila Agustin, NP  citalopram (CELEXA) 20 MG tablet Take 1 tablet (20 mg total) by mouth at bedtime. 01/19/16  Yes Adonis BrookSheila Agustin, NP  tamsulosin (FLOMAX) 0.4 MG CAPS capsule Take 1 capsule (0.4 mg total) by mouth daily after supper. 01/19/16  Yes Adonis BrookSheila Agustin, NP  traZODone (DESYREL) 100 MG tablet Take 1 tablet (100 mg total) by mouth at bedtime. 01/19/16  Yes Adonis BrookSheila Agustin, NP    Social History   Social History  . Marital status: Single    Spouse name: N/A  . Number of children: N/A  . Years of education: N/A   Occupational History  . Not on file.   Social History Main Topics  . Smoking status: Light Tobacco Smoker    Packs/day: 0.25    Types: Cigarettes  . Smokeless tobacco: Never Used  . Alcohol use Yes     Comment: 3 beers on weekends  . Drug use: No  . Sexual activity: Not on file   Other  Topics Concern  . Not on file   Social History Narrative  . No narrative on file     Family History  Problem Relation Age of Onset  . Diabetes Neg Hx   . Stroke Neg Hx   . Cancer Neg Hx   . Clotting disorder Neg Hx     ROS: [x]  Positive   [ ]  Negative   [ ]  All sytems reviewed and are negative  Cardiovascular: [x]  chest pain/pressure []  palpitations []  SOB lying flat []  DOE []  pain in legs while walking [x]  pain in legs at rest []  pain in legs at night []  non-healing ulcers [x]  hx of DVT [x]  swelling in legs  Pulmonary: []  productive cough []  asthma/wheezing []  home O2  Neurologic: []  weakness in []  arms []  legs []  numbness in []  arms []  legs []  hx of CVA []  mini stroke [] difficulty speaking or slurred speech []  temporary loss of vision in one eye []  dizziness  Hematologic: []  hx of cancer []  bleeding problems []  problems with blood clotting easily  Endocrine:   []  diabetes []  thyroid disease  GI []  vomiting blood []  blood in stool  GU: []  CKD/renal failure []  HD--[]  M/W/F or []  T/T/S []  burning with urination []  blood in urine  Psychiatric: []  anxiety []  depression  Musculoskeletal: []  arthritis []  joint  pain  Integumentary: []  rashes []  ulcers  Constitutional: []  fever []  chills   Physical Examination  Vitals:   02/02/16 0100 02/02/16 1323  BP: (!) 95/48 119/60  Pulse: 65 64  Resp: 20 18  Temp: 98 F (36.7 C) 98.9 F (37.2 C)   Body mass index is 23.51 kg/m.  General:  WDWN in NAD Gait: Not observed HENT: WNL, normocephalic Pulmonary: normal non-labored breathing, without Rales, rhonchi,  wheezing Cardiac: rrr, palpable pedal pulses bilaterally Abdomen: soft, NT/ND, no masses Skin: without rashes Extremities: no pitting edema, legs have similar circumference Musculoskeletal: no muscle wasting or atrophy  Neurologic: A&O X 3; Appropriate Affect ; SENSATION: normal; MOTOR FUNCTION:  moving all extremities equally.  Speech is fluent/normal Psychiatric:  Normal mood and affect  CBC    Component Value Date/Time   WBC 5.2 02/02/2016 0047   RBC 4.37 02/02/2016 0047   HGB 11.6 (L) 02/02/2016 0047   HCT 35.0 (L) 02/02/2016 0047   PLT 207 02/02/2016 0047   MCV 80.1 02/02/2016 0047   MCH 26.5 02/02/2016 0047   MCHC 33.1 02/02/2016 0047   RDW 15.1 02/02/2016 0047   LYMPHSABS 0.7 01/10/2016 0111   MONOABS 0.3 01/10/2016 0111   EOSABS 0.0 01/10/2016 0111   BASOSABS 0.0 01/10/2016 0111    BMET    Component Value Date/Time   NA 138 02/02/2016 0047   K 3.6 02/02/2016 0047   CL 108 02/02/2016 0047   CO2 25 02/02/2016 0047   GLUCOSE 137 (H) 02/02/2016 0047   BUN 24 (H) 02/02/2016 0047   CREATININE 1.15 02/02/2016 0047   CALCIUM 8.3 (L) 02/02/2016 0047   GFRNONAA >60 02/02/2016 0047   GFRAA >60 02/02/2016 0047    COAGS: Lab Results  Component Value Date   INR 1.03 02/01/2016   INR 1.39 05/11/2014   INR 1.04 03/04/2014     Non-Invasive Vascular Imaging:   Summary:  - There is extensive, non occlusive,highly acute DVT noted in the   left common femoral, femoral, popliteal, posterior tibial, and   peroneal veins. There is acute thrombus noted in the left   gastrocnemius. The thrombus is mobile in the left common femoral   vein. There is acute, non occlusive DVT noted in the right   femoral, femoral and popliteal veins, and partially occlusive DVT   noted in the right posterior tibial and peroneal veins. - Findings consistent with acute deep vein thrombosis involving the   right lower extremity. - Findings consistent with acute deep vein thrombosis involving the   left lower extremity.   ASSESSMENT/PLAN: This is a 50 y.o. male protein c deficiency. We discussed his extensive dvt specifically in his left lower extremity where he has thrombosed his femoral and popliteal segment. I agree that the mobile thrombus is not of concern and any intervention would be for debulking and to prevent  future post-thrombotic syndrome. He is uncertain about any intervention at this time. I told I would be available this week should he change his mind and want intervention. We can make him an outpatient appointment if he would rather discuss it then but best chance for clot debulking will be in the first 2 weeks.   Areta Terwilliger C. Randie Heinzain, MD [facility] Office: [phone] Pager: [phone]

## 2016-02-02 NOTE — Progress Notes (Signed)
ANTICOAGULATION CONSULT NOTE   Pharmacy Consult for Heparin Indication: DVT  Allergies  Allergen Reactions  . Pork-Derived Products Nausea And Vomiting    Patient Measurements: Height: 5\' 7"  (170.2 cm) Weight: 150 lb 1.6 oz (68.1 kg) IBW/kg (Calculated) : 66.1 Heparin Dosing Weight: actual weight  Vital Signs: Temp: 98 F (36.7 C) (11/20 0100) Temp Source: Oral (11/20 0100) BP: 95/48 (11/20 0100) Pulse Rate: 65 (11/20 0100)  Labs:  Recent Labs  02/01/16 1400 02/01/16 1811 02/02/16 0047 02/02/16 0732  HGB 12.5*  --  11.6*  --   HCT 38.3*  --  35.0*  --   PLT 233  --  207  --   APTT  --  33  --   --   LABPROT  --  13.5  --   --   INR  --  1.03  --   --   HEPARINUNFRC  --   --  0.34 0.38  CREATININE 1.20  --  1.15  --     Estimated Creatinine Clearance: 70.3 mL/min (by C-G formula based on SCr of 1.15 mg/dL).   Medical History: Past Medical History:  Diagnosis Date  . DVT of lower extremity (deep venous thrombosis) (HCC)   . PE (pulmonary embolism)     Medications:  Infusions:  . heparin 1,250 Units/hr (02/01/16 1901)   ------------------------------------------------ Assessment: [age] yoM, with PMH significant for  DVT/PE, Protein C deficiency, and substance misuse (UDS pos for cocaine) not on anticoagulation PTA. Of note, he was recently hospitalized on 10/28-10/30 and then subsequently [facility] for suicidal ideation and cocaine use. Presented to ED on 11/19 with SOB and chest pain as well as LLE swelling and pain. Chest CT ruled out PE, doppler confirmed bilateral DVTs. Heparin started upon admission for DVTs.   Today, 02/02/2016: - Repeat HL now back and therapeutic at 0.38.  - CBC stable - SCr stable - No bleeding noted  Goal of Therapy:  Heparin level 0.3-0.7 units/ml Monitor platelets by anticoagulation protocol: Yes   Plan:   Continue heparin IV infusion at 1250 units/hr  Daily heparin level and CBC  Monitor for s/sx of bleeding  F/u  with plan for transitioning to oral anticoagulants  Susy Frizzleicole Sorah Falkenstein Student-PharmD 02/02/2016 8:23 AM

## 2016-02-03 DIAGNOSIS — R339 Retention of urine, unspecified: Secondary | ICD-10-CM

## 2016-02-03 LAB — BASIC METABOLIC PANEL
ANION GAP: 5 (ref 5–15)
BUN: 27 mg/dL — ABNORMAL HIGH (ref 6–20)
CALCIUM: 8.2 mg/dL — AB (ref 8.9–10.3)
CO2: 25 mmol/L (ref 22–32)
Chloride: 108 mmol/L (ref 101–111)
Creatinine, Ser: 1.11 mg/dL (ref 0.61–1.24)
GLUCOSE: 99 mg/dL (ref 65–99)
POTASSIUM: 3.7 mmol/L (ref 3.5–5.1)
SODIUM: 138 mmol/L (ref 135–145)

## 2016-02-03 LAB — CBC
HCT: 34.1 % — ABNORMAL LOW (ref 39.0–52.0)
HEMOGLOBIN: 11.1 g/dL — AB (ref 13.0–17.0)
MCH: 25.9 pg — AB (ref 26.0–34.0)
MCHC: 32.6 g/dL (ref 30.0–36.0)
MCV: 79.7 fL (ref 78.0–100.0)
PLATELETS: 202 10*3/uL (ref 150–400)
RBC: 4.28 MIL/uL (ref 4.22–5.81)
RDW: 15.2 % (ref 11.5–15.5)
WBC: 5 10*3/uL (ref 4.0–10.5)

## 2016-02-03 LAB — HEPARIN LEVEL (UNFRACTIONATED): HEPARIN UNFRACTIONATED: 0.36 [IU]/mL (ref 0.30–0.70)

## 2016-02-03 MED ORDER — CYCLOBENZAPRINE HCL 10 MG PO TABS
10.0000 mg | ORAL_TABLET | Freq: Three times a day (TID) | ORAL | Status: DC | PRN
  Administered 2016-02-03 – 2016-02-06 (×4): 10 mg via ORAL
  Filled 2016-02-03 (×4): qty 1

## 2016-02-03 MED ORDER — BISACODYL 5 MG PO TBEC
10.0000 mg | DELAYED_RELEASE_TABLET | Freq: Every day | ORAL | Status: DC | PRN
  Administered 2016-02-03 – 2016-02-06 (×2): 10 mg via ORAL
  Filled 2016-02-03 (×2): qty 2

## 2016-02-03 MED ORDER — OXYMETAZOLINE HCL 0.05 % NA SOLN
1.0000 | Freq: Two times a day (BID) | NASAL | Status: DC | PRN
  Filled 2016-02-03: qty 15

## 2016-02-03 MED ORDER — OXYCODONE HCL 5 MG PO TABS
10.0000 mg | ORAL_TABLET | ORAL | Status: DC | PRN
  Administered 2016-02-03 – 2016-02-07 (×14): 10 mg via ORAL
  Filled 2016-02-03 (×15): qty 2

## 2016-02-03 NOTE — Progress Notes (Signed)
ANTICOAGULATION CONSULT NOTE   Pharmacy Consult for Heparin Indication: DVT  Allergies  Allergen Reactions  . Pork-Derived Products Nausea And Vomiting    Patient Measurements: Height: 5\' 7"  (170.2 cm) Weight: 150 lb 1.6 oz (68.1 kg) IBW/kg (Calculated) : 66.1 Heparin Dosing Weight: actual weight  Vital Signs: Temp: 97.9 F (36.6 C) (11/21 0629) Temp Source: Oral (11/21 0629) BP: 115/71 (11/21 0629) Pulse Rate: 49 (11/21 0629)  Labs:  Recent Labs  02/01/16 1400 02/01/16 1811 02/02/16 0047 02/02/16 0732 02/03/16 0326  HGB 12.5*  --  11.6*  --  11.1*  HCT 38.3*  --  35.0*  --  34.1*  PLT 233  --  207  --  202  APTT  --  33  --   --   --   LABPROT  --  13.5  --   --   --   INR  --  1.03  --   --   --   HEPARINUNFRC  --   --  0.34 0.38 0.36  CREATININE 1.20  --  1.15  --  1.11    Estimated Creatinine Clearance: 72.8 mL/min (by C-G formula based on SCr of 1.11 mg/dL).   Medical History: Past Medical History:  Diagnosis Date  . DVT of lower extremity (deep venous thrombosis) (HCC)   . PE (pulmonary embolism)     Medications:  Infusions:  . sodium chloride 75 mL/hr at 02/03/16 0631  . heparin 1,250 Units/hr (02/02/16 1426)   ------------------------------------------------ Assessment: 50 yoM, with PMH significant for  DVT/PE, Protein C deficiency, and substance misuse (UDS pos for cocaine) not on anticoagulation PTA. Of note, he was recently hospitalized on 10/28-10/30 and then subsequently [facility] for suicidal ideation and cocaine use. Presented to ED on 11/19 with SOB and chest pain as well as LLE swelling and pain. Chest CT ruled out PE, doppler confirmed bilateral DVTs. Heparin started upon admission for DVTs. Per Dr. Myna HidalgoEnnever, patient should continue treatment with heparin while in-patient and he is to start lifelong anticoagulation with Xarelto outpatient, which will require work to make sure he is able to acquire it affordably.   Today, 02/03/2016: -  03:26 HL therapeutic at 0.36 - CBC stable - SCr stable - Small nose bleed noted by RN early this AM, but she reports that it stopped immediately and the patient remained asymptomatic.  - VVS consulted and suggested clot debulking to avoid post-thrombotic syndrome. Patient to decide on this course, which can be done outpatient.    Goal of Therapy:  Heparin level 0.3-0.7 units/ml Monitor platelets by anticoagulation protocol: Yes   Plan:   Continue heparin IV infusion at 1250 units/hr  Daily heparin level and CBC  Monitor for s/sx of bleeding  F/u with plan for transitioning to oral anticoagulants  Susy Frizzleicole Eduarda Scrivens Student-PharmD 02/03/2016 7:17 AM

## 2016-02-03 NOTE — Significant Event (Signed)
Rapid Response Event Note  Overview: Time Called: 2130 Event Type: Other (Comment) (pt c/o chest pain)  Initial Focused Assessment: Bedside RN called due to pt c/o chest pain.  Over the phone I had bedside RN assess his pain, pt desribes his pain 8 out of 10, substernal, hurts more when he takes a deep breath, when asked when chest pain began stated that he has been having this pain since admission (which was on the 19th and today is the 21st).  I advised the bedside RN to do a 12 lead EKG which was done and I would be up to see him.  Upon walking into the room, pt laying in bed, alert, after my introduction, he advised me that "I don't want to talk with you, I want to talk with the doctor".  I again advised him who I was and my role with rapid response and asked if I could at least assess him.  He stated, "No, I will wait for the doctor!"  Pt is refusing my services after two times seeking permission to assess him.  I viewed 12-lead that was done prior to my arrival, which is normal, no ST segment elevation noted.    Interventions: 12-lead EKG and call NP on call for [facility] and advise them of the situation  Plan of Care (if not transferred): Kathrine Schorr,NP was notified by the bedside RN regarding his chest pain.  New pain medication ordered.  No orders to transfer.  Pt visually looks in no disress.  Advised bedside RN if any changes please call me back.  Event Summary: Name of Physician Notified: Rhys MartiniKathrine Schoor,NP at 2140    at    Outcome: Stayed in room and stabalized  Event End Time: 2145  Doylene CanardJohnson, Kang Ishida Charles

## 2016-02-03 NOTE — Progress Notes (Signed)
The patient refused Trazodone and Celexa. Patient stated that this does not help his urine flow. Also, patient stated that he had "a test that showed he had an enlarged prostate." Patient has decided to keep the catheter in his bladder.

## 2016-02-03 NOTE — Progress Notes (Signed)
Patient c/o  chest tightness and pain that does not radiate. Rate pain 8/10. V/S stable. EKG perform as ordered. Pain med given per prn. Patient states that he is feeling better. NP on call made aware. Resting quietly in bed at this time in no acute episode, call bell within reach.

## 2016-02-03 NOTE — Progress Notes (Signed)
Patient had a small nosebleed that stopped immediately. NP on call made aware and new order for nose spray given. Patient remain asymptomatic.

## 2016-02-03 NOTE — Progress Notes (Signed)
Patient refused Rapid Response. Patient wants to see the MD. Will notify PCP on call.

## 2016-02-03 NOTE — Progress Notes (Addendum)
PROGRESS NOTE    Peter Washington  ZOX:[id number] DOB: September 26, 1965 DOA: 02/01/2016  PCP: No primary care provider on file.   Brief Narrative:  Peter Washington is a 50 y.o. 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 He has known history of DVTs and PEs but has not been taking his xarelto for the past 1 year had leg swelling and pain right worse than left. Denies any fevers or chills Describes chest pain and sharp constant worse with inspiration also worse with any movement and reproducible palpation. He had prior pain in the past similar to this and felt to be secondary to PE. Patient reports tenderness to his coughs bilaterally. He denies any recent travel but has been recently hospitalized no nausea no vomiting no diarrhea associated this. Reports not taking any of his medications for the past 1 week. No melena no blood in stools. No travel hx.   Subjective: Continues to have left sided chest pain but not as bad. Left leg/ calf pain is bothering him. Vicodin does not help at all.   Assessment & Plan:   Principal Problem:   Acute DVT (deep venous thrombosis)  / Protein C deficiency   - cont Heparin for now- will need life long anticoagulation as this is his 2nd episode - have consulted hematology- Dr Myna Hidalgo   -has extensive b/l thrombus- after reviewing ultrasound, Vascular surgery Dr Randie Heinz recommending Thrombolysis to prevent long term complications- patient wants to think about it for now- Dr Randie Heinz will talk to him further tomorrow- he could potentially go home and have procedure done even on XArelto - has mobile thrombus- allowing him to ambulate today - change Vicodin 2 tabs to Oxycodone 2  tabs PRN    Active Problems:   Chest pain - no PE on imaging - muscular- start Voltaren gel, Motrin and Toradol PRN     Tobacco use - Nicotine patch  Dehydration - elevated BUN/ Cr ratio- started NS IVF  U retention/ BPH - states he is not voiding well - > 1 L in bladder after voiding small amounts- Foley placed - cont Flomax-   Constipation - oral Dulcolax today- may improve after foley    Cocaine dependence, continuous  - advised to discontinue    Depression - Abilify, Celexa, Trazodone - [facility] at [facility]   DVT prophylaxis: Heparin infusion Code Status: Full code Family Communication:  Disposition Plan: home in 2-3 days Consultants:   hematology Procedures:    Antimicrobials:  Anti-infectives    None       Objective: Vitals:   02/02/16 2142 02/02/16 2234 02/03/16 0629 02/03/16 1236  BP: (!) 117/59 123/64 115/71 121/70  Pulse: 66 66 (!) 49 (!) 55  Resp: 18  14 18   Temp: 98 F (36.7 C)  97.9 F (36.6 C) 98.6 F (37 C)  TempSrc: Oral  Oral Oral  SpO2: 98% 98% 95% 100%  Weight:      Height:        Intake/Output Summary (Last 24 hours) at 02/03/16 1653 Last data filed at 02/03/16 0800  Gross per 24 hour  Intake          1670.83 ml  Output  1 ml  Net          1669.83 ml   Filed Weights   02/01/16 1307 02/01/16 2144  Weight: 68 kg (150 lb) 68.1 kg (150 lb 1.6 oz)    Examination: General exam: Appears comfortable  HEENT: PERRLA, oral mucosa moist, no sclera icterus or thrush Respiratory system: Clear to auscultation. Respiratory effort normal. Chest: tender to palpation in left chest wall Cardiovascular system: S1 & S2 heard, RRR.  No murmurs  Gastrointestinal system: Abdomen soft, non-tender, nondistended. Normal bowel sound. No organomegaly Central nervous system: Alert and oriented. No focal neurological deficits. Extremities: No cyanosis, clubbing or edema- tender in left calf Skin: No rashes or ulcers Psychiatry:  Mood &  affect appropriate.     Data Reviewed: I have personally reviewed following labs and imaging studies  CBC:  Recent Labs Lab 02/01/16 1400 02/02/16 0047 02/03/16 0326  WBC 6.6 5.2 5.0  HGB 12.5* 11.6* 11.1*  HCT 38.3* 35.0* 34.1*  MCV 80.5 80.1 79.7  PLT 233 207 202   Basic Metabolic Panel:  Recent Labs Lab 02/01/16 1400 02/01/16 1615 02/02/16 0047 02/03/16 0326  NA 139  --  138 138  K 3.8  --  3.6 3.7  CL 106  --  108 108  CO2 29  --  25 25  GLUCOSE 88  --  137* 99  BUN 17  --  24* 27*  CREATININE 1.20  --  1.15 1.11  CALCIUM 8.8*  --  8.3* 8.2*  MG  --  2.2 2.2  --   PHOS  --  3.3 4.7*  --    GFR: Estimated Creatinine Clearance: 72.8 mL/min (by C-G formula based on SCr of 1.11 mg/dL). Liver Function Tests:  Recent Labs Lab 02/01/16 2000 02/02/16 0047  AST 22 17  ALT 20 16*  ALKPHOS 48 46  BILITOT 0.9 0.6  PROT 6.8 5.7*  ALBUMIN 3.9 3.4*   No results for input(s): LIPASE, AMYLASE in the last 168 hours. No results for input(s): AMMONIA in the last 168 hours. Coagulation Profile:  Recent Labs Lab 02/01/16 1811  INR 1.03   Cardiac Enzymes: No results for input(s): CKTOTAL, CKMB, CKMBINDEX, TROPONINI in the last 168 hours. BNP (last 3 results) No results for input(s): PROBNP in the last 8760 hours. HbA1C: No results for input(s): HGBA1C in the last 72 hours. CBG: No results for input(s): GLUCAP in the last 168 hours. Lipid Profile: No results for input(s): CHOL, HDL, LDLCALC, TRIG, CHOLHDL, LDLDIRECT in the last 72 hours. Thyroid Function Tests:  Recent Labs  02/02/16 0047  TSH 1.347   Anemia Panel: No results for input(s): VITAMINB12, FOLATE, FERRITIN, TIBC, IRON, RETICCTPCT in the last 72 hours. Urine analysis:    Component Value Date/Time   COLORURINE YELLOW 01/18/2016 1150   APPEARANCEUR CLEAR 01/18/2016 1150   LABSPEC 1.011 01/18/2016 1150   PHURINE 7.0 01/18/2016 1150   GLUCOSEU NEGATIVE 01/18/2016 1150   HGBUR NEGATIVE  01/18/2016 1150   BILIRUBINUR NEGATIVE 01/18/2016 1150   KETONESUR NEGATIVE 01/18/2016 1150   PROTEINUR NEGATIVE 01/18/2016 1150   UROBILINOGEN >8.0 (H) 12/24/2014 2020   NITRITE NEGATIVE 01/18/2016 1150   LEUKOCYTESUR NEGATIVE 01/18/2016 1150   Sepsis Labs: @LABRCNTIP (procalcitonin:4,lacticidven:4) )No results found for this or any previous visit (from the past 240 hour(s)).       Radiology Studies: No results found.    Scheduled Meds: . ARIPiprazole  5 mg Oral Daily  . citalopram  20 mg Oral QHS  .  diclofenac sodium  2 g Topical QID  . folic acid  1 mg Oral Daily  . multivitamin with minerals  1 tablet Oral Daily  . nicotine  7 mg Transdermal Daily  . sodium chloride flush  3 mL Intravenous Q12H  . tamsulosin  0.4 mg Oral QPC supper  . thiamine  100 mg Oral Daily   Or  . thiamine  100 mg Intravenous Daily  . traZODone  100 mg Oral QHS   Continuous Infusions: . sodium chloride 75 mL/hr at 02/03/16 0631  . heparin 1,250 Units/hr (02/03/16 1556)     LOS: 1 day    Time spent in minutes: 35    Archimedes Harold, MD [facility] Pager: [email or web address] Password [email or web address] 02/03/2016, 4:53 PM

## 2016-02-03 NOTE — Progress Notes (Signed)
Patient has attempted several times to void completely on his own. Most amount out has been 150cc. Bladder scanned and showed >985cc urine. Abdomen is tight and tender when palpated. MD notified and orders obtained. Melton Alarana A Greysin Medlen, RN

## 2016-02-03 NOTE — Progress Notes (Signed)
Patient c/o chest pain and numbness in left arm. EKG was performed,Vitals were taken:98.3;HR68;RR18;148/80;94% room air SATs.  The foley now has blood tinged urine and small blood clots in the tubing.  PCP on call was notified. Awaiting any new orders.

## 2016-02-04 ENCOUNTER — Other Ambulatory Visit: Payer: Self-pay

## 2016-02-04 DIAGNOSIS — I82409 Acute embolism and thrombosis of unspecified deep veins of unspecified lower extremity: Secondary | ICD-10-CM

## 2016-02-04 DIAGNOSIS — R310 Gross hematuria: Secondary | ICD-10-CM

## 2016-02-04 LAB — BASIC METABOLIC PANEL WITH GFR
Anion gap: 5 (ref 5–15)
BUN: 17 mg/dL (ref 6–20)
CO2: 28 mmol/L (ref 22–32)
Calcium: 8.7 mg/dL — ABNORMAL LOW (ref 8.9–10.3)
Chloride: 109 mmol/L (ref 101–111)
Creatinine, Ser: 1.14 mg/dL (ref 0.61–1.24)
GFR calc Af Amer: >60 mL/min (ref >60)
GFR calc non Af Amer: >60 mL/min (ref >60)
Glucose, Bld: 90 mg/dL (ref 65–99)
Potassium: 4.1 mmol/L (ref 3.5–5.1)
Sodium: 142 mmol/L (ref 135–145)

## 2016-02-04 LAB — CBC
HEMATOCRIT: 35.8 % — AB (ref 39.0–52.0)
HEMOGLOBIN: 11.7 g/dL — AB (ref 13.0–17.0)
MCH: 26.4 pg (ref 26.0–34.0)
MCHC: 32.7 g/dL (ref 30.0–36.0)
MCV: 80.6 fL (ref 78.0–100.0)
Platelets: 227 10*3/uL (ref 150–400)
RBC: 4.44 MIL/uL (ref 4.22–5.81)
RDW: 15.4 % (ref 11.5–15.5)
WBC: 6.9 10*3/uL (ref 4.0–10.5)

## 2016-02-04 LAB — PROTEIN C ACTIVITY: PROTEIN C ACTIVITY: 78 % (ref 73–180)

## 2016-02-04 LAB — HEPARIN LEVEL (UNFRACTIONATED): HEPARIN UNFRACTIONATED: 0.31 [IU]/mL (ref 0.30–0.70)

## 2016-02-04 MED ORDER — OXYBUTYNIN CHLORIDE 5 MG PO TABS
5.0000 mg | ORAL_TABLET | Freq: Four times a day (QID) | ORAL | Status: DC | PRN
  Administered 2016-02-05 – 2016-02-06 (×3): 5 mg via ORAL
  Filled 2016-02-04 (×4): qty 1

## 2016-02-04 MED ORDER — OXYBUTYNIN CHLORIDE 5 MG PO TABS
5.0000 mg | ORAL_TABLET | Freq: Four times a day (QID) | ORAL | Status: AC
  Administered 2016-02-04 – 2016-02-05 (×4): 5 mg via ORAL
  Filled 2016-02-04 (×3): qty 1

## 2016-02-04 MED ORDER — OXYBUTYNIN CHLORIDE 5 MG PO TABS
2.5000 mg | ORAL_TABLET | Freq: Three times a day (TID) | ORAL | Status: DC | PRN
  Administered 2016-02-04: 2.5 mg via ORAL
  Filled 2016-02-04: qty 1

## 2016-02-04 MED ORDER — FLUOCINONIDE 0.05 % EX GEL: Freq: Once | CUTANEOUS | Status: DC

## 2016-02-04 MED ORDER — LIDOCAINE HCL 2 % EX GEL
1.0000 "application " | Freq: Once | CUTANEOUS | Status: AC
  Administered 2016-02-04: 1 via URETHRAL
  Filled 2016-02-04: qty 5

## 2016-02-04 MED ORDER — OXYBUTYNIN CHLORIDE 5 MG PO TABS: 5.0000 mg | ORAL_TABLET | Freq: Four times a day (QID) | ORAL | Status: DC

## 2016-02-04 NOTE — Consult Note (Signed)
Urology Consult  Consulting MD: Dr. Elisabeth Pigeonevine  CC: Blood in urine  HPI: This is a 50 year old male admitted for management of bilateral DVTs.  He is now on heparin for anticoagulation.  The patient apparently had a Foley catheter placement, which was traumatic .  At the time of his hospital admission.  Since that time, with the patient being on blood thinner, he has had significant hematuria as well as significant bladder pain.  The patient is having bladder spasms as well as feeling of rectal spasms.  Urologic consultation is requested for catheter management as well as for his gross hematuria.  The patient apparently does have a prior history of prostate problems.  He has been on Flomax in the past, but has not been on this recently.  He does have to strain with urination.  He does not have regular gross hematuria or dysuria.  He does not have recurrent urinary tract infections.  [place]-he has not seen a urologist since his move here.  PMH: Past Medical History:  Diagnosis Date  . DVT of lower extremity (deep venous thrombosis) (HCC)   . PE (pulmonary embolism)     PSH: History reviewed. No pertinent surgical history.  Allergies: Allergies  Allergen Reactions  . Pork-Derived Products Nausea And Vomiting    Medications: Prescriptions Prior to Admission  Medication Sig Dispense Refill Last Dose  . ARIPiprazole (ABILIFY) 5 MG tablet Take 1 tablet (5 mg total) by mouth daily. 30 tablet 0 Past Month at Unknown time  . citalopram (CELEXA) 20 MG tablet Take 1 tablet (20 mg total) by mouth at bedtime. 30 tablet 0 Past Month at Unknown time  . tamsulosin (FLOMAX) 0.4 MG CAPS capsule Take 1 capsule (0.4 mg total) by mouth daily after supper. 30 capsule 0 Past Month at Unknown time  . traZODone (DESYREL) 100 MG tablet Take 1 tablet (100 mg total) by mouth at bedtime. 30 tablet 0 Past Month at Unknown time     Social History: Social  History   Social History  . Marital status: Single    Spouse name: N/A  . Number of children: N/A  . Years of education: N/A   Occupational History  . Not on file.   Social History Main Topics  . Smoking status: Light Tobacco Smoker    Packs/day: 0.25    Types: Cigarettes  . Smokeless tobacco: Never Used  . Alcohol use Yes     Comment: 3 beers on weekends  . Drug use: No  . Sexual activity: Not on file   Other Topics Concern  . Not on file   Social History Narrative  . No narrative on file    Family History: Family History  Problem Relation Age of Onset  . Diabetes Neg Hx   . Stroke Neg Hx   . Cancer Neg Hx   . Clotting disorder Neg Hx     Review of Systems: Positive: Gross hematuria, feeling of frequency, urgency, bladder and rectal pain. Negative:   A further 10 point review of systems was negative except what is listed in the HPI.  Physical Exam: @VITALS2 @ General: In moderate distress.  Awake. Head:  Normocephalic.  Atraumatic. ENT:  EOMI.  Mucous membranes moist Neck:  Supple.  No lymphadenopathy. CV:  S1 present. S2 present. Regular rate. Pulmonary: Equal effort bilaterally.  Clear to auscultation bilaterally. Abdomen: Soft.  Non- tender to palpation. Skin:  Normal turgor.  No visible rash. Extremity: No gross deformity of bilateral upper extremities.  No gross deformity of    bilateral lower extremities. Neurologic: Alert. Appropriate mood.  Penis:  Uncircumcised.  No lesions. Urethra: Foley catheter in place.  Orthotopic meatus. Scrotum: No lesions.  No ecchymosis.  No erythema. Testicles: Descended bilaterally.  No masses bilaterally. Epididymis: Palpable bilaterally.  Non Tender to palpation.  Studies:  Recent Labs     02/03/16  0326  02/04/16  0350  HGB  11.1*  11.7*  WBC  5.0  6.9  PLT  202  227    Recent Labs     02/03/16  0326  02/04/16  0350  NA  138  142  K  3.7  4.1  CL  108  109  CO2  25  28  BUN  27*  17  CREATININE   1.11  1.14  CALCIUM  8.2*  8.7*  GFRNONAA  >60  >60  GFRAA  >60  >60     Recent Labs     02/01/16  1811  INR  1.03  APTT  33     Invalid input(s): ABG  I felt that the patient's 14 French Foley catheter was not properly placed.  When I did let the balloon down, the patient did have some improvement in the pain in his bladder.  After sterile prep and drape, and instilling 10 mL of 2% viscous lidocaine in his urethra, a 20 [place] coud tip catheter was advanced into the bladder without significant difficulty.  Only 20-30 mL of reddish urine was obtained.  I then irrigated the bladder with 200 mL of saline.  No significant clots were obtained.  This was then hooked to bedside drainage.  Assessment:  1.  Probable urethral trauma in an anticoagulated patient.  2.  Hematuria, this will probably be ongoing for a while with the patient on heparin for his bilateral DVTs  Plan: 1.  At this point, we will treat his bladder spasms-I will have a standing order for Ditropan for the next 24 hours, then it can be made when necessary  2.  Irrigate catheter when necessary, although this may not be necessary.  3.  Continue Flomax  0.4 milligrams daily-he should be discharged on this.  4.  Once the patient's urine becomes clear, hopefully soon, the catheter may be removed for voiding trial      Pager:[phone]

## 2016-02-04 NOTE — Progress Notes (Addendum)
Patient ID: Peter Washington, male   DOB: May 04, 1965, 50 y.o.   MRN: [id number]  PROGRESS NOTE    Peter Washington  WUJ:[id number] DOB: [birth year] DOA: 02/01/2016  PCP: No primary care provider on file.   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 He has known history of DVTs and PEs but has not been taking his xareltofor the past 1 year. He has been seen by hematology who recommended xarelto on discharge (now on heparin drip). Vascular plans on LE venography next week (week of 11/27) in [facility].  Assessment & Plan:   Principal Problem:   Acute right and left lower extremity DVT (deep venous thrombosis)  / Protein C deficiency   - LE doppler 11/19 showed an extensive, non occluisve,highly acute DVT noted in the left common femoral, femoral, popliteal, posterior tibial, and peroneal veins. The thrombus is mobile in the left common femoral vein. There is acute, non occlusive DVT noted in the right femoral, femoral and popliteal veins - Per vasc surgery, okay to sch intervention for next week at [facility] (if he remains in [facility] until next week then [facility] to [facility]) - Plan for LE venography next week (in or outpt) - Stable hgb and platelets - He does have hematuria so will get GU input - Seen by hematology - no further AC work up required as it will not change the fact he needs AC in long run, can resume xarelto on discharge   Active Problems:   Hematuria - Appreciate GI input - Stable hgb and platelet count     Chest pain - No PE on imaging - Resolved     Tobacco use - Nicotine patch ordered  - Counseled on smoking cessation     Dehydration - Resolved with fluids    Urinary retention / BPH  - Foley  placed 11/21; had 1 L in volume bladder  - GU to see in consultation  - Continue Flomax     Constipation - Had BM in past 24 hours     Cocaine dependence, continuous  - Advised to discontinue    Depression - Continue Abilify, Celexa, Trazodone - recently at [facility]   DVT prophylaxis: On heparin drip  Code Status: full code  Family Communication: no family at the bedside this am Disposition Plan: home possibly in next 24 hours, awaiting GU input on hematuria and BPH   Consultants:   Vascular surgery  Hematology  Urology   Procedures:   LE doppler 02/01/2016 -  There is extensive, non occluisve,highly acute DVT noted in the left common femoral, femoral, popliteal, posterior tibial, and peroneal veins. The thrombus is mobile in the left common femoral vein. There is acute, non occlusive DVT noted in the right femoral, femoral and popliteal veins  Antimicrobials:   None    Subjective: Left leg pain 8/10 in intensity.   Objective: Vitals:   02/03/16 1236 02/03/16 2130 02/03/16 2200 02/04/16 0641  BP: 121/70 136/68 (!) 148/80 103/65  Pulse: (!) 55 (!) 57 65 64  Resp: 18 17 18 16   Temp: 98.6 F (37 C) 98.1 F (36.7 C) 98.3 F (36.8 C) 97.6 F (36.4 C)  TempSrc: Oral Oral  Oral  SpO2: 100% 100% 94% 100%  Weight:  Height:        Intake/Output Summary (Last 24 hours) at 02/04/16 1256 Last data filed at 02/04/16 1100  Gross per 24 hour  Intake          3174.17 ml  Output             3650 ml  Net          -475.83 ml   Filed Weights   02/01/16 1307 02/01/16 2144  Weight: 68 kg (150 lb) 68.1 kg (150 lb 1.6 oz)    Examination:  General exam: Appears calm and comfortable  Respiratory system: Clear to auscultation. Respiratory effort normal. Cardiovascular system: S1 & S2 heard, Rate controlled  Gastrointestinal system: Abdomen is nondistended, soft and nontender. No organomegaly or masses felt. Normal bowel sounds heard. Central nervous system: Alert and  oriented. No focal neurological deficits. Extremities: Symmetric 5 x 5 power. Left leg tender to palpation Skin: No rashes, lesions or ulcers Psychiatry: Judgement and insight appear normal. Mood & affect appropriate.   Data Reviewed: I have personally reviewed following labs and imaging studies  CBC:  Recent Labs Lab 02/01/16 1400 02/02/16 0047 02/03/16 0326 02/04/16 0350  WBC 6.6 5.2 5.0 6.9  HGB 12.5* 11.6* 11.1* 11.7*  HCT 38.3* 35.0* 34.1* 35.8*  MCV 80.5 80.1 79.7 80.6  PLT 233 207 202 227   Basic Metabolic Panel:  Recent Labs Lab 02/01/16 1400 02/01/16 1615 02/02/16 0047 02/03/16 0326 02/04/16 0350  NA 139  --  138 138 142  K 3.8  --  3.6 3.7 4.1  CL 106  --  108 108 109  CO2 29  --  25 25 28   GLUCOSE 88  --  137* 99 90  BUN 17  --  24* 27* 17  CREATININE 1.20  --  1.15 1.11 1.14  CALCIUM 8.8*  --  8.3* 8.2* 8.7*  MG  --  2.2 2.2  --   --   PHOS  --  3.3 4.7*  --   --    GFR: Estimated Creatinine Clearance: 70.9 mL/min (by C-G formula based on SCr of 1.14 mg/dL). Liver Function Tests:  Recent Labs Lab 02/01/16 2000 02/02/16 0047  AST 22 17  ALT 20 16*  ALKPHOS 48 46  BILITOT 0.9 0.6  PROT 6.8 5.7*  ALBUMIN 3.9 3.4*   No results for input(s): LIPASE, AMYLASE in the last 168 hours. No results for input(s): AMMONIA in the last 168 hours. Coagulation Profile:  Recent Labs Lab 02/01/16 1811  INR 1.03   Cardiac Enzymes: No results for input(s): CKTOTAL, CKMB, CKMBINDEX, TROPONINI in the last 168 hours. BNP (last 3 results) No results for input(s): PROBNP in the last 8760 hours. HbA1C: No results for input(s): HGBA1C in the last 72 hours. CBG: No results for input(s): GLUCAP in the last 168 hours. Lipid Profile: No results for input(s): CHOL, HDL, LDLCALC, TRIG, CHOLHDL, LDLDIRECT in the last 72 hours. Thyroid Function Tests:  Recent Labs  02/02/16 0047  TSH 1.347   Anemia Panel: No results for input(s): VITAMINB12, FOLATE, FERRITIN,  TIBC, IRON, RETICCTPCT in the last 72 hours. Urine analysis:    Component Value Date/Time   COLORURINE YELLOW 01/18/2016 1150   APPEARANCEUR CLEAR 01/18/2016 1150   LABSPEC 1.011 01/18/2016 1150   PHURINE 7.0 01/18/2016 1150   GLUCOSEU NEGATIVE 01/18/2016 1150   HGBUR NEGATIVE 01/18/2016 1150   BILIRUBINUR NEGATIVE 01/18/2016 1150   KETONESUR NEGATIVE 01/18/2016 1150   PROTEINUR NEGATIVE 01/18/2016 1150  UROBILINOGEN >8.0 (H) 12/24/2014 2020   NITRITE NEGATIVE 01/18/2016 1150   LEUKOCYTESUR NEGATIVE 01/18/2016 1150   Sepsis Labs: @LABRCNTIP (procalcitonin:4,lacticidven:4)   )No results found for this or any previous visit (from the past 240 hour(s)).    Radiology Studies: Dg Chest 2 View Result Date: 02/01/2016 No active cardiopulmonary disease. Electronically Signed   By: Harmon PierJeffrey  Hu M.D.   On: 02/01/2016 14:24   Ct Angio Chest Pe W And/or Wo Contrast Result Date: 02/01/2016 Negative for pulmonary embolus.  No acute disease.     Scheduled Meds: . ARIPiprazole  5 mg Oral Daily  . citalopram  20 mg Oral QHS  . diclofenac sodium  2 g Topical QID  . folic acid  1 mg Oral Daily  . multivitamin with minerals  1 tablet Oral Daily  . nicotine  7 mg Transdermal Daily  . sodium chloride flush  3 mL Intravenous Q12H  . tamsulosin  0.4 mg Oral QPC supper  . thiamine  100 mg Oral Daily   Or  . thiamine  100 mg Intravenous Daily  . traZODone  100 mg Oral QHS   Continuous Infusions: . heparin 1,250 Units/hr (02/04/16 0946)     LOS: 2 days    Time spent: 25 minutes  Greater than 50% of the time spent on counseling and coordinating the care.   Manson PasseyEVINE, Yazmina Pareja, MD [facility] Pager [phone]  If 7PM-7AM, please contact night-coverage [email or web address] Password [email or web address] 02/04/2016, 12:56 PM

## 2016-02-04 NOTE — Progress Notes (Signed)
   I called to discuss case with patient and at this time he wants to proceed with lower extremity venography. I believe he is safe from a pe standpoint to be discharged on xarelto and will schedule intervention for next week at [facility]. If he has issues between now and then or remains in the hospital until that time will need to [facility]. I will discuss details of procedure in person prior to procedure.   Jourdan Maldonado C. Randie Heinzain, MD [facility] Office: [phone] Pager: [phone]

## 2016-02-04 NOTE — Progress Notes (Signed)
ANTICOAGULATION CONSULT NOTE   Pharmacy Consult for Heparin Indication: DVT  Allergies  Allergen Reactions  . Pork-Derived Products Nausea And Vomiting    Patient Measurements: Height: 5\' 7"  (170.2 cm) Weight: 150 lb 1.6 oz (68.1 kg) IBW/kg (Calculated) : 66.1 Heparin Dosing Weight: actual weight  Vital Signs: Temp: 97.6 F (36.4 C) (11/22 0641) Temp Source: Oral (11/22 0641) BP: 103/65 (11/22 0641) Pulse Rate: 64 (11/22 0641)  Labs:  Recent Labs  02/01/16 1811  02/02/16 0047 02/02/16 0732 02/03/16 0326 02/04/16 0350  HGB  --   --  11.6*  --  11.1* 11.7*  HCT  --   --  35.0*  --  34.1* 35.8*  PLT  --   --  207  --  202 227  APTT 33  --   --   --   --   --   LABPROT 13.5  --   --   --   --   --   INR 1.03  --   --   --   --   --   HEPARINUNFRC  --   < > 0.34 0.38 0.36 0.31  CREATININE  --   --  1.15  --  1.11 1.14  < > = values in this interval not displayed.  Estimated Creatinine Clearance: 70.9 mL/min (by C-G formula based on SCr of 1.14 mg/dL).   Medical History: Past Medical History:  Diagnosis Date  . DVT of lower extremity (deep venous thrombosis) (HCC)   . PE (pulmonary embolism)     Assessment: [age] yoM, with PMH significant for  DVT/PE, Protein C deficiency, and substance misuse (UDS positive for cocaine) not on anticoagulation PTA. Of note, he was recently hospitalized on 10/28-10/30 and then [facility] and [facility] use. Presented to ED on 11/19 with SOB and chest pain as well as LLE swelling and pain. Chest CT ruled out PE, doppler confirmed bilateral DVTs. Heparin started upon admission for DVTs. Per Dr. Myna HidalgoEnnever, patient should continue treatment with heparin while inpatient and he is to start lifelong anticoagulation with Xarelto outpatient, which will require work to make sure he is able to acquire it affordably. Of note, patient has reported intolerance to pork-derived products (n/v), but has been tolerating heparin  infusion.  Today, 02/04/2016: - 0350 HL on low end of therapeutic range at 0.31 - CBC stable - SCr stable - No further nosebleeds reported. However, patient now having bloody urine-TRH considering urology consult. - VVS consulted and suggested clot debulking to avoid post-thrombotic syndrome. Patient to decide on this course, which can be done outpatient.    Goal of Therapy:  Heparin level 0.3-0.5 units/ml (see discussion below) Monitor platelets by anticoagulation protocol: Yes   Plan:   Per Dr. Elisabeth Pigeonevine, aware of bloody urine and ok to continue heparin infusion at this time. However, will target lower end of heparin goal range, 0.3-0.5 units/mL.  Continue heparin infusion at 1250 units/hr.  Daily heparin level and CBC while on heparin infusion.  Monitor for further s/sx of bleeding-RN aware to notify pharmacy/MD.  F/u plan for transitioning to oral anticoagulation.    Greer PickerelJigna Jhoan Schmieder, PharmD, BCPS Pager: [phone] 02/04/2016 10:30 AM

## 2016-02-05 DIAGNOSIS — I82492 Acute embolism and thrombosis of other specified deep vein of left lower extremity: Secondary | ICD-10-CM

## 2016-02-05 DIAGNOSIS — F3289 Other specified depressive episodes: Secondary | ICD-10-CM

## 2016-02-05 LAB — CBC
HEMATOCRIT: 35.5 % — AB (ref 39.0–52.0)
HEMOGLOBIN: 11.5 g/dL — AB (ref 13.0–17.0)
MCH: 26 pg (ref 26.0–34.0)
MCHC: 32.4 g/dL (ref 30.0–36.0)
MCV: 80.1 fL (ref 78.0–100.0)
Platelets: 233 10*3/uL (ref 150–400)
RBC: 4.43 MIL/uL (ref 4.22–5.81)
RDW: 15.4 % (ref 11.5–15.5)
WBC: 7.8 10*3/uL (ref 4.0–10.5)

## 2016-02-05 LAB — PROTEIN C, TOTAL: PROTEIN C, TOTAL: 64 % (ref 60–150)

## 2016-02-05 LAB — HEPARIN LEVEL (UNFRACTIONATED): Heparin Unfractionated: 0.35 IU/mL (ref 0.30–0.70)

## 2016-02-05 MED ORDER — URELLE 81 MG PO TABS
1.0000 | ORAL_TABLET | Freq: Four times a day (QID) | ORAL | Status: DC | PRN
  Administered 2016-02-05 – 2016-02-06 (×5): 81 mg via ORAL
  Filled 2016-02-05 (×6): qty 1

## 2016-02-05 MED ORDER — RIVAROXABAN 15 MG PO TABS
15.0000 mg | ORAL_TABLET | Freq: Two times a day (BID) | ORAL | Status: DC
  Administered 2016-02-05 – 2016-02-07 (×5): 15 mg via ORAL
  Filled 2016-02-05 (×5): qty 1

## 2016-02-05 MED ORDER — RIVAROXABAN 15 MG PO TABS
15.0000 mg | ORAL_TABLET | Freq: Once | ORAL | Status: AC
  Administered 2016-02-05: 15 mg via ORAL
  Filled 2016-02-05: qty 1

## 2016-02-05 MED ORDER — RIVAROXABAN 20 MG PO TABS: 20.0000 mg | ORAL_TABLET | Freq: Every day | ORAL | Status: DC

## 2016-02-05 MED ORDER — HYDROMORPHONE HCL 2 MG PO TABS
2.0000 mg | ORAL_TABLET | Freq: Four times a day (QID) | ORAL | Status: DC | PRN
  Administered 2016-02-05 – 2016-02-07 (×3): 2 mg via ORAL
  Filled 2016-02-05 (×3): qty 1

## 2016-02-05 NOTE — Progress Notes (Signed)
Patient complaining of pressure in lower abdomen. Foley cath draining red urine with no clots. Bladder scan showed 123cc at most. Irrigated cath with 50cc NS with no clots returned. Very painful with irrigation. Patient straining to try and void even with foley in place. MD notified. Melton Alarana A Katy Brickell, RN

## 2016-02-05 NOTE — Progress Notes (Signed)
Patient ID: Peter Washington, male   DOB: 03/08/1966, 50 y.o.   MRN: [id number]  PROGRESS NOTE    Peter Washington  [id number]:[id number] DOB: 08/29/1965 DOA: 02/01/2016  PCP: No primary care provider on file.   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 He has known history of DVTs and PEs but has not been taking his xareltofor the past 1 year. He has been seen by hematology who recommended xarelto on discharge (now on heparin drip). V[facility]  Assessment & Plan:   Principal Problem:   Acute right and left lower extremity DVT (deep venous thrombosis)  / Protein C deficiency   - LE doppler 11/19 showed an extensive, non occluisve,highly acute DVT noted in the left common femoral, femoral, popliteal, posterior tibial, and peroneal veins. The thrombus is mobile in the left common femoral vein. There is acute, non occlusive DVT noted in the right femoral, femoral and popliteal veins - Per vasc surgery, okay [facility] remains in [facility] - Plan for LE venography next week (in or outpt) - Stable hgb and platelets - Seen by hematology - no further AC work up required as it will not change the fact he needs AC in long run, can resume xarelto on discharge   Active Problems:   Hematuria - Appreciate GI seeing the pt in consultation - Plan to remove foley for voiding trial if urine gets clear - Stable hgb and platelet count     Chest pain - No PE on imaging - Resolved     Tobacco use - Nicotine patch ordered  - Counseled on smoking cessation     Dehydration - Resolved with fluids      Urinary retention / BPH  - Foley placed 11/21; had 1 L in volume bladder  - GU to see in consultation  - Continue Flomax     Constipation - Resolved     Cocaine dependence, continuous  - Advised to discontinue    Depression - Continue Abilify, Celexa, Trazodone - Stable, does not feel depressed    DVT prophylaxis: On heparin drip; transition to xarelto in anticipation for discharge tomorrow  Code Status: full code  Family Communication: no family at the bedside this am Disposition Plan: home possibly in next 24 hours   Consultants:   Vascular surgery  Hematology  Urology   Procedures:   LE doppler 02/01/2016 -  There is extensive, non occluisve,highly acute DVT noted in the left common femoral, femoral, popliteal, posterior tibial, and peroneal veins. The thrombus is mobile in the left common femoral vein. There is acute, non occlusive DVT noted in the right femoral, femoral and popliteal veins  Antimicrobials:   None    Subjective: No overnight events.   Objective: Vitals:   02/04/16 0641 02/04/16 1500 02/04/16 2006 02/05/16 0410  BP: 103/65 111/68 121/60 118/77  Pulse: 64 70 82 (!) 57  Resp: 16 18 18 18   Temp: 97.6 F (36.4 C) 97.9 F (36.6 C) 98.4 F (36.9 C) 98.2 F (36.8 C)  TempSrc: Oral Oral Oral Oral  SpO2: 100% 99% 100% 100%  Weight:  Height:        Intake/Output Summary (Last 24 hours) at 02/05/16 1048 Last data filed at 02/05/16 0600  Gross per 24 hour  Intake          1700.21 ml  Output             3451 ml  Net         -1750.79 ml   Filed Weights   02/01/16 1307 02/01/16 2144  Weight: 68 kg (150 lb) 68.1 kg (150 lb 1.6 oz)    Examination:  General exam: Appears calm and comfortable, no distress  Respiratory system: No wheezing, no rhonchi  Cardiovascular system: S1 & S2 heard, RRR Gastrointestinal system: (+) BS, non tender abdomen  Central nervous system: ANo focal neurological deficits. Extremities: palpable  pulses, no swelling  Skin: No rashes, lesions or ulcers Psychiatry: Normal mood and behavior    Data Reviewed: I have personally reviewed following labs and imaging studies  CBC:  Recent Labs Lab 02/01/16 1400 02/02/16 0047 02/03/16 0326 02/04/16 0350 02/05/16 0340  WBC 6.6 5.2 5.0 6.9 7.8  HGB 12.5* 11.6* 11.1* 11.7* 11.5*  HCT 38.3* 35.0* 34.1* 35.8* 35.5*  MCV 80.5 80.1 79.7 80.6 80.1  PLT 233 207 202 227 233   Basic Metabolic Panel:  Recent Labs Lab 02/01/16 1400 02/01/16 1615 02/02/16 0047 02/03/16 0326 02/04/16 0350  NA 139  --  138 138 142  K 3.8  --  3.6 3.7 4.1  CL 106  --  108 108 109  CO2 29  --  25 25 28   GLUCOSE 88  --  137* 99 90  BUN 17  --  24* 27* 17  CREATININE 1.20  --  1.15 1.11 1.14  CALCIUM 8.8*  --  8.3* 8.2* 8.7*  MG  --  2.2 2.2  --   --   PHOS  --  3.3 4.7*  --   --    GFR: Estimated Creatinine Clearance: 70.9 mL/min (by C-G formula based on SCr of 1.14 mg/dL). Liver Function Tests:  Recent Labs Lab 02/01/16 2000 02/02/16 0047  AST 22 17  ALT 20 16*  ALKPHOS 48 46  BILITOT 0.9 0.6  PROT 6.8 5.7*  ALBUMIN 3.9 3.4*   No results for input(s): LIPASE, AMYLASE in the last 168 hours. No results for input(s): AMMONIA in the last 168 hours. Coagulation Profile:  Recent Labs Lab 02/01/16 1811  INR 1.03   Cardiac Enzymes: No results for input(s): CKTOTAL, CKMB, CKMBINDEX, TROPONINI in the last 168 hours. BNP (last 3 results) No results for input(s): PROBNP in the last 8760 hours. HbA1C: No results for input(s): HGBA1C in the last 72 hours. CBG: No results for input(s): GLUCAP in the last 168 hours. Lipid Profile: No results for input(s): CHOL, HDL, LDLCALC, TRIG, CHOLHDL, LDLDIRECT in the last 72 hours. Thyroid Function Tests: No results for input(s): TSH, T4TOTAL, FREET4, T3FREE, THYROIDAB in the last 72 hours. Anemia Panel: No results for input(s): VITAMINB12, FOLATE, FERRITIN, TIBC, IRON, RETICCTPCT in the last 72  hours. Urine analysis:    Component Value Date/Time   COLORURINE YELLOW 01/18/2016 1150   APPEARANCEUR CLEAR 01/18/2016 1150   LABSPEC 1.011 01/18/2016 1150   PHURINE 7.0 01/18/2016 1150   GLUCOSEU NEGATIVE 01/18/2016 1150   HGBUR NEGATIVE 01/18/2016 1150   BILIRUBINUR NEGATIVE 01/18/2016 1150   KETONESUR NEGATIVE 01/18/2016 1150   PROTEINUR NEGATIVE 01/18/2016 1150   UROBILINOGEN >8.0 (H) 12/24/2014 2020   NITRITE NEGATIVE 01/18/2016 1150  LEUKOCYTESUR NEGATIVE 01/18/2016 1150   Sepsis Labs: @LABRCNTIP (procalcitonin:4,lacticidven:4)   )No results found for this or any previous visit (from the past 240 hour(s)).    Radiology Studies: Dg Chest 2 View Result Date: 02/01/2016 No active cardiopulmonary disease. Electronically Signed   By: Harmon PierJeffrey  Hu M.D.   On: 02/01/2016 14:24   Ct Angio Chest Pe W And/or Wo Contrast Result Date: 02/01/2016 Negative for pulmonary embolus.  No acute disease.     Scheduled Meds: . ARIPiprazole  5 mg Oral Daily  . citalopram  20 mg Oral QHS  . diclofenac sodium  2 g Topical QID  . folic acid  1 mg Oral Daily  . multivitamin with minerals  1 tablet Oral Daily  . nicotine  7 mg Transdermal Daily  . oxybutynin  5 mg Oral QID  . sodium chloride flush  3 mL Intravenous Q12H  . tamsulosin  0.4 mg Oral QPC supper  . thiamine  100 mg Oral Daily   Or  . thiamine  100 mg Intravenous Daily  . traZODone  100 mg Oral QHS   Continuous Infusions: . heparin 1,250 Units/hr (02/04/16 0946)     LOS: 3 days    Time spent: 15 minutes  Greater than 50% of the time spent on counseling and coordinating the care.   Manson PasseyEVINE, Jalon Squier, MD [facility] Pager [phone]  If 7PM-7AM, please contact night-coverage [email or web address] Password [email or web address] 02/05/2016, 10:48 AM

## 2016-02-05 NOTE — Progress Notes (Addendum)
ANTICOAGULATION CONSULT NOTE   Pharmacy Consult for Heparin ---> Rivaroxaban Indication: DVT  Allergies  Allergen Reactions  . Pork-Derived Products Nausea And Vomiting    Patient Measurements: Height: 5\' 7"  (170.2 cm) Weight: 150 lb 1.6 oz (68.1 kg) IBW/kg (Calculated) : 66.1 Heparin Dosing Weight: actual weight  Vital Signs: Temp: 98.2 F (36.8 C) (11/23 0410) Temp Source: Oral (11/23 0410) BP: 118/77 (11/23 0410) Pulse Rate: 57 (11/23 0410)  Labs:  Recent Labs  02/03/16 0326 02/04/16 0350 02/05/16 0340  HGB 11.1* 11.7* 11.5*  HCT 34.1* 35.8* 35.5*  PLT 202 227 233  HEPARINUNFRC 0.36 0.31 0.35  CREATININE 1.11 1.14  --     Estimated Creatinine Clearance: 70.9 mL/min (by C-G formula based on SCr of 1.14 mg/dL).   Medical History: Past Medical History:  Diagnosis Date  . DVT of lower extremity (deep venous thrombosis) (HCC)   . PE (pulmonary embolism)     Assessment: [age] yoM, with PMH significant for  DVT/PE, Protein C deficiency, and substance misuse (UDS positive for cocaine) not on anticoagulation PTA. Of note, he was recently hospitalized on 10/28-10/30 and [facility] and [facility] use. Presented to ED on 11/19 with SOB and chest pain as well as LLE swelling and pain. Chest CT ruled out PE, doppler confirmed bilateral DVTs. Heparin started upon admission for DVTs. Per Dr. Myna HidalgoEnnever, patient should continue treatment with heparin while inpatient and he is to start lifelong anticoagulation with Xarelto outpatient, which will require work to make sure he is able to acquire it affordably. Of note, patient has reported intolerance to pork-derived products (n/v), but has been tolerating heparin infusion.  Today, 02/05/2016: - 0340 HL = 0.35 units/mL, therapeutic on heparin infusion at 1250 units/hr - CBC stable - CrCl > 30 ml/min - Vascular surgery consulted-plan for lower extremity venography next week    - Urology consulted  11/22 due to hematuria-foley cath repositioned - Per nursing, hematuria improved this AM-urine light pink color, no clots  Goal of Therapy:  Heparin level 0.3-0.5 units/ml (targeting lower end of therapeutic range 2/2 hematuria per d/w Dr. Elisabeth Pigeonevine 11/22) Monitor platelets by anticoagulation protocol: Yes   Plan:   Continue heparin infusion at 1250 units/hr.  Daily heparin level and CBC while on heparin infusion.  Monitor for further s/sx of bleeding-RN aware to notify pharmacy/MD.  F/u plan for transitioning to oral anticoagulation.    Greer PickerelJigna Nathyn Luiz, PharmD, BCPS Pager: [phone] 02/05/2016 8:26 AM   Addendum:  Received orders to transition patient from IV heparin infusion to Rivaroxaban.  Stop heparin infusion at 1200.  At 1200, start Xarelto 15mg  PO BID x 21 days, then 20mg  PO daily thereafter. Doses to be given with food.  Above plan discussed with RN. Will provide education to patient prior to discharge.   Greer PickerelJigna Whitten Andreoni, PharmD, BCPS Pager: [phone] 02/05/2016 11:22 AM

## 2016-02-06 ENCOUNTER — Inpatient Hospital Stay (HOSPITAL_COMMUNITY): Payer: Self-pay

## 2016-02-06 DIAGNOSIS — I82409 Acute embolism and thrombosis of unspecified deep veins of unspecified lower extremity: Secondary | ICD-10-CM

## 2016-02-06 DIAGNOSIS — I824Z3 Acute embolism and thrombosis of unspecified deep veins of distal lower extremity, bilateral: Secondary | ICD-10-CM

## 2016-02-06 LAB — CBC
HEMATOCRIT: 34 % — AB (ref 39.0–52.0)
HEMOGLOBIN: 11.1 g/dL — AB (ref 13.0–17.0)
MCH: 25.8 pg — ABNORMAL LOW (ref 26.0–34.0)
MCHC: 32.6 g/dL (ref 30.0–36.0)
MCV: 78.9 fL (ref 78.0–100.0)
Platelets: 211 10*3/uL (ref 150–400)
RBC: 4.31 MIL/uL (ref 4.22–5.81)
RDW: 15.2 % (ref 11.5–15.5)
WBC: 7.9 10*3/uL (ref 4.0–10.5)

## 2016-02-06 MED ORDER — ALBUTEROL SULFATE (2.5 MG/3ML) 0.083% IN NEBU: 2.5000 mg | INHALATION_SOLUTION | RESPIRATORY_TRACT | Status: DC | PRN

## 2016-02-06 MED ORDER — FAMOTIDINE 20 MG PO TABS
40.0000 mg | ORAL_TABLET | Freq: Two times a day (BID) | ORAL | Status: DC
  Administered 2016-02-06 – 2016-02-07 (×2): 40 mg via ORAL
  Filled 2016-02-06 (×2): qty 2

## 2016-02-06 MED ORDER — SODIUM CHLORIDE 0.9 % IV SOLN
510.0000 mg | Freq: Once | INTRAVENOUS | Status: AC
  Administered 2016-02-06: 510 mg via INTRAVENOUS
  Filled 2016-02-06: qty 17

## 2016-02-06 MED ORDER — ALBUTEROL SULFATE (2.5 MG/3ML) 0.083% IN NEBU
INHALATION_SOLUTION | RESPIRATORY_TRACT | Status: AC
  Administered 2016-02-06: 18:00:00
  Filled 2016-02-06: qty 3

## 2016-02-06 MED ORDER — SENNA 8.6 MG PO TABS
1.0000 | ORAL_TABLET | Freq: Every day | ORAL | Status: DC
  Administered 2016-02-06 – 2016-02-07 (×2): 8.6 mg via ORAL
  Filled 2016-02-06 (×2): qty 1

## 2016-02-06 MED ORDER — BISACODYL 10 MG RE SUPP
10.0000 mg | Freq: Once | RECTAL | Status: AC
  Administered 2016-02-06: 10 mg via RECTAL
  Filled 2016-02-06: qty 1

## 2016-02-06 NOTE — Progress Notes (Signed)
Patient voided 50cc red urine, no clots noted. PVR bladder scan showed 579cc. MD notified and orders to continue to monitor and allow patient to try and void on his own. MD to be notified prn. Melton Alarana A Sheila Gervasi, RN

## 2016-02-06 NOTE — Progress Notes (Addendum)
Patient ID: Peter Washington, male   DOB: 08/05/1965, 50 y.o.   MRN: [id number]  PROGRESS NOTE    Peter Krebslvin Daffern  WGN:[id number] DOB: 05/16/1965 DOA: 02/01/2016  PCP: No primary care provider on file.   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 He has known history of DVTs and PEs but has not been taking his xareltofor the past 1 year. He has been seen by hematology who recommended xarelto on discharge (now on heparin drip). Vascular [facility] LE [facility]  Assessment & Plan:   Principal Problem:   Acute right and left lower extremity DVT (deep venous thrombosis)  / Protein C deficiency   - LE doppler 11/19 showed an extensive, non occluisve,highly acute DVT noted in the left common femoral, femoral, popliteal, posterior tibial, and peroneal veins. The thrombus is mobile in the left common femoral vein. There is acute, non occlusive DVT noted in the right femoral, femoral and popliteal veins - Per vasc surgery, okay [facility] (if he remains in [facility] until next week then [facility]) - Plan for LE venography next week (in or outpt) - Stable hgb and platelets - Seen by hematology - no further AC work up required as it will not change the fact he needs AC in long run, can resume xarelto on discharge  - Started xarelto 11/23, so far tolerates okay   Active Problems:   Hematuria - Appreciate GI seeing the pt in consultation - Remove foley today - Still some blood in foley - Recheck CBC in am and if hgb stable d/c home in am    Chest pain - No PE on imaging - Resolved     Tobacco use - Nicotine patch ordered  - Counseled on  smoking cessation     Dehydration - Resolved with fluids    Urinary retention / BPH  - Foley placed 11/21; had 1 L in volume bladder  - GU to see in consultation  - Continue Flomax     Constipation - Resolved     Cocaine dependence, continuous  - Advised to discontinue    Depression - Continue Abilify, Celexa, Trazodone   DVT prophylaxis: xarelto started 11/23; prior to that he was on heparin drip  Code Status: full code  Family Communication: no family at the bedside this am Disposition Plan: home in am if hgb stable   Consultants:   Vascular surgery  Hematology  Urology   Procedures:   LE doppler 02/01/2016 -  There is extensive, non occluisve,highly acute DVT noted in the left common femoral, femoral, popliteal, posterior tibial, and peroneal veins. The thrombus is mobile in the left common femoral vein. There is acute, non occlusive DVT noted in the right femoral, femoral and popliteal veins  Antimicrobials:   None    Subjective: No overnight events.   Objective: Vitals:   02/05/16 0410 02/05/16 1533 02/05/16 2024 02/06/16 0500  BP: 118/77 139/78 126/66 (!) 114/55  Pulse: (!) 57 65 80 65  Resp: 18 18 18 18   Temp: 98.2 F (36.8 C) 98.2 F (36.8 C) 98.2 F (36.8 C) 98.4 F (36.9 C)  TempSrc: Oral Oral Oral Oral  SpO2: 100%  100% 98% 100%  Weight:      Height:        Intake/Output Summary (Last 24 hours) at 02/06/16 1211 Last data filed at 02/06/16 1145  Gross per 24 hour  Intake              480 ml  Output             2200 ml  Net            -1720 ml   Filed Weights   02/01/16 1307 02/01/16 2144  Weight: 68 kg (150 lb) 68.1 kg (150 lb 1.6 oz)    Examination:  General exam: No distress  Respiratory system: No wheezing, no rhonchi  Cardiovascular system: S1 & S2 heard, rate controlled  Gastrointestinal system: (+) BS, non tender abdomen  Central nervous system: Nonfocal  Extremities: no edema, palpable pulses  Skin: No rashes,  lesions or ulcers, warm and dry  Psychiatry: Normal mood and behavior    Data Reviewed: I have personally reviewed following labs and imaging studies  CBC:  Recent Labs Lab 02/02/16 0047 02/03/16 0326 02/04/16 0350 02/05/16 0340 02/06/16 0321  WBC 5.2 5.0 6.9 7.8 7.9  HGB 11.6* 11.1* 11.7* 11.5* 11.1*  HCT 35.0* 34.1* 35.8* 35.5* 34.0*  MCV 80.1 79.7 80.6 80.1 78.9  PLT 207 202 227 233 211   Basic Metabolic Panel:  Recent Labs Lab 02/01/16 1400 02/01/16 1615 02/02/16 0047 02/03/16 0326 02/04/16 0350  NA 139  --  138 138 142  K 3.8  --  3.6 3.7 4.1  CL 106  --  108 108 109  CO2 29  --  25 25 28   GLUCOSE 88  --  137* 99 90  BUN 17  --  24* 27* 17  CREATININE 1.20  --  1.15 1.11 1.14  CALCIUM 8.8*  --  8.3* 8.2* 8.7*  MG  --  2.2 2.2  --   --   PHOS  --  3.3 4.7*  --   --    GFR: Estimated Creatinine Clearance: 70.9 mL/min (by C-G formula based on SCr of 1.14 mg/dL). Liver Function Tests:  Recent Labs Lab 02/01/16 2000 02/02/16 0047  AST 22 17  ALT 20 16*  ALKPHOS 48 46  BILITOT 0.9 0.6  PROT 6.8 5.7*  ALBUMIN 3.9 3.4*   No results for input(s): LIPASE, AMYLASE in the last 168 hours. No results for input(s): AMMONIA in the last 168 hours. Coagulation Profile:  Recent Labs Lab 02/01/16 1811  INR 1.03   Cardiac Enzymes: No results for input(s): CKTOTAL, CKMB, CKMBINDEX, TROPONINI in the last 168 hours. BNP (last 3 results) No results for input(s): PROBNP in the last 8760 hours. HbA1C: No results for input(s): HGBA1C in the last 72 hours. CBG: No results for input(s): GLUCAP in the last 168 hours. Lipid Profile: No results for input(s): CHOL, HDL, LDLCALC, TRIG, CHOLHDL, LDLDIRECT in the last 72 hours. Thyroid Function Tests: No results for input(s): TSH, T4TOTAL, FREET4, T3FREE, THYROIDAB in the last 72 hours. Anemia Panel: No results for input(s): VITAMINB12, FOLATE, FERRITIN, TIBC, IRON, RETICCTPCT in the last 72 hours. Urine analysis:      Component Value Date/Time   COLORURINE YELLOW 01/18/2016 1150   APPEARANCEUR CLEAR 01/18/2016 1150   LABSPEC 1.011 01/18/2016 1150   PHURINE 7.0 01/18/2016 1150   GLUCOSEU NEGATIVE 01/18/2016 1150   HGBUR NEGATIVE 01/18/2016 1150   BILIRUBINUR NEGATIVE 01/18/2016 1150   KETONESUR NEGATIVE 01/18/2016 1150  PROTEINUR NEGATIVE 01/18/2016 1150   UROBILINOGEN >8.0 (H) 12/24/2014 2020   NITRITE NEGATIVE 01/18/2016 1150   LEUKOCYTESUR NEGATIVE 01/18/2016 1150   Sepsis Labs: @LABRCNTIP (procalcitonin:4,lacticidven:4)   )No results found for this or any previous visit (from the past 240 hour(s)).    Radiology Studies: Dg Chest 2 View Result Date: 02/01/2016 No active cardiopulmonary disease. Electronically Signed   By: Harmon Pier M.D.   On: 02/01/2016 14:24   Ct Angio Chest Pe W And/or Wo Contrast Result Date: 02/01/2016 Negative for pulmonary embolus.  No acute disease.     Scheduled Meds: . ARIPiprazole  5 mg Oral Daily  . citalopram  20 mg Oral QHS  . diclofenac sodium  2 g Topical QID  . famotidine  40 mg Oral BID  . ferumoxytol  510 mg Intravenous Once  . folic acid  1 mg Oral Daily  . multivitamin with minerals  1 tablet Oral Daily  . nicotine  7 mg Transdermal Daily  . rivaroxaban  15 mg Oral BID WC   Followed by  . [START ON 02/26/2016] rivaroxaban  20 mg Oral Q supper  . senna  1 tablet Oral Daily  . sodium chloride flush  3 mL Intravenous Q12H  . tamsulosin  0.4 mg Oral QPC supper  . thiamine  100 mg Oral Daily   Or  . thiamine  100 mg Intravenous Daily  . traZODone  100 mg Oral QHS   Continuous Infusions:    LOS: 4 days    Time spent: 15 minutes  Greater than 50% of the time spent on counseling and coordinating the care.   Manson Passey, MD [facility] Pager [phone]  If 7PM-7AM, please contact night-coverage [email or web address] Password [email or web address] 02/06/2016, 12:11 PM

## 2016-02-06 NOTE — Progress Notes (Signed)
Urine old blood to clear Bladder spasms settled Had been straining to urinate day prior but was voiding OK prior to this [facility] of [facility] on Flomax to see Dr Retta Dionesahlstedt as outpt

## 2016-02-06 NOTE — Progress Notes (Addendum)
*  Preliminary Results* Follow up bilateral lower extremity venous duplex completed. Bilateral lower extremities are positive for deep vein thrombosis.  The deep vein thrombosis (DVT) that was previously seen (02/01/16)  in the right common femoral vein appears to have resolved, and the DVT in the right proximal femoral vein appears to have partially resolved. The remaining segments of the right femoral vein, as well as the right popliteal, right gastrocnemius, right posterior tibial, and right peroneal veins appear unchanged with evidence of acute deep vein thrombosis.   The mobile thrombus that was previously seen in the left common femoral vein is still present and is still mobile, extending into the saphenofemoral junction. The left femoral, popliteal, and gastrconemius veins are unchanged with acute DVT.   There is no evidence of Baker's cyst bilaterally.  Preliminary results discussed with Dr. Elisabeth Pigeonevine.  02/06/2016 11:07 AM Gertie FeyMichelle Nikola Marone, BS, RVT, RDCS, RDMS

## 2016-02-06 NOTE — Progress Notes (Signed)
  Progress Note    02/06/2016 8:47 PM * No surgery found *  Subjective:  Leg pain improving  Vitals:   02/06/16 1500 02/06/16 1800  BP: (!) 143/79   Pulse: 71 82  Resp: 18 18  Temp: 97.7 F (36.5 C)     Physical Exam: aaox3 Abdomen is soft lowere extremities with improved swelling and pain CBC    Component Value Date/Time   WBC 7.9 02/06/2016 0321   RBC 4.31 02/06/2016 0321   HGB 11.1 (L) 02/06/2016 0321   HCT 34.0 (L) 02/06/2016 0321   PLT 211 02/06/2016 0321   MCV 78.9 02/06/2016 0321   MCH 25.8 (L) 02/06/2016 0321   MCHC 32.6 02/06/2016 0321   RDW 15.2 02/06/2016 0321   LYMPHSABS 0.7 01/10/2016 0111   MONOABS 0.3 01/10/2016 0111   EOSABS 0.0 01/10/2016 0111   BASOSABS 0.0 01/10/2016 0111    BMET    Component Value Date/Time   NA 142 02/04/2016 0350   K 4.1 02/04/2016 0350   CL 109 02/04/2016 0350   CO2 28 02/04/2016 0350   GLUCOSE 90 02/04/2016 0350   BUN 17 02/04/2016 0350   CREATININE 1.14 02/04/2016 0350   CALCIUM 8.7 (L) 02/04/2016 0350   GFRNONAA >60 02/04/2016 0350   GFRAA >60 02/04/2016 0350    INR    Component Value Date/Time   INR 1.03 02/01/2016 1811     Intake/Output Summary (Last 24 hours) at 02/06/16 2047 Last data filed at 02/06/16 1827  Gross per 24 hour  Intake              720 ml  Output             1800 ml  Net            -1080 ml     Assessment:  50 y.o. male is here with extensive bilateral lower extremity dvt, hematuria 2/2 foley placement  Plan: Discussed possible ascending venogram of ble. Hematuria could be limiting factor to lysis given recent traumatic foley placement. Will leave on schedule for this coming Wednesday but might have to cancel.    Shadoe Bethel C. Randie Heinzain, MD [facility] Office: [phone] Pager: [phone]  02/06/2016 8:47 PM

## 2016-02-06 NOTE — Progress Notes (Signed)
Patient c/o chest pain and shortness of breath. Pain in left breast. Feels a tightness. Rates pain 9/10. VS 128/66-76 O2 sat 96% on room air. Resp 28-32. O2 started at 2l/min for comfort. 12-Lead EKG shows NSR. MD notified and orders obtained. Melton Alarana A Addie Cederberg

## 2016-02-06 NOTE — Progress Notes (Signed)
Peter Washington is evidence of issues with blood in the urine. I think this is more from the Foley catheter. He has been seen by urology.  He is now off heparin. He is on Xarelto. I have to believe that this will be paid for once he is an outpatient.  I'm not sure as to why vascular surgery needs to see him. I am not sure what they would have planned for him. I suppose I will have to give them a call to figure out what they have planned.  He has normal protein C levels. I realize that they were lower before. I think we need to follow this along.  He has no swelling in the legs. He has no pain in the legs. I really think it would be worthwhile getting another Doppler of his legs to see how these blood clots look.  He says he has occasional chest discomfort. This is when he talks. I don't know if he has any kind of reflux. May be getting him on a antiacid will help.  His labs look good today. His hemoglobin is 11.1. His MCV is 79. He had iron studies done about 3 weeks ago. His iron is borderline. I think that he probably would benefit from a dose of IV iron while in the hospital.  On his physical exam, his vital signs show temperature 98.4. Blood pressure 114/55. Pulse is 65.  He has no leg swelling. I cannot palpate any venous cord in his legs. His lungs are clear. Cardiac exam regular rate and rhythm. Abdomen is soft. He has good bowel sounds. He has no fluid wave.  Again, I will have to call Dr. Randie Heinzain to see exactly what he might be thinking about with a lower extremity venogram.  I very much appreciate the outstanding care that he is getting from everybody up on 4 W.  Christin BachPete Ennever, MD  Psalm 100:4

## 2016-02-07 LAB — CBC
HCT: 34.7 % — ABNORMAL LOW (ref 39.0–52.0)
HEMOGLOBIN: 11.3 g/dL — AB (ref 13.0–17.0)
MCH: 25.9 pg — ABNORMAL LOW (ref 26.0–34.0)
MCHC: 32.6 g/dL (ref 30.0–36.0)
MCV: 79.6 fL (ref 78.0–100.0)
PLATELETS: 213 10*3/uL (ref 150–400)
RBC: 4.36 MIL/uL (ref 4.22–5.81)
RDW: 15.4 % (ref 11.5–15.5)
WBC: 7.4 10*3/uL (ref 4.0–10.5)

## 2016-02-07 LAB — BASIC METABOLIC PANEL
Anion gap: 7 (ref 5–15)
BUN: 15 mg/dL (ref 6–20)
CHLORIDE: 105 mmol/L (ref 101–111)
CO2: 27 mmol/L (ref 22–32)
CREATININE: 1.21 mg/dL (ref 0.61–1.24)
Calcium: 8.9 mg/dL (ref 8.9–10.3)
Glucose, Bld: 96 mg/dL (ref 65–99)
Potassium: 3.8 mmol/L (ref 3.5–5.1)
SODIUM: 139 mmol/L (ref 135–145)

## 2016-02-07 MED ORDER — TAMSULOSIN HCL 0.4 MG PO CAPS: 0.4000 mg | ORAL_CAPSULE | Freq: Every day | ORAL | 0 refills | Status: DC

## 2016-02-07 MED ORDER — FAMOTIDINE 40 MG PO TABS: 40.0000 mg | ORAL_TABLET | Freq: Every day | ORAL | 0 refills | Status: DC

## 2016-02-07 MED ORDER — ACETAMINOPHEN 325 MG PO TABS: 650.0000 mg | ORAL_TABLET | Freq: Four times a day (QID) | ORAL | 0 refills | Status: DC | PRN

## 2016-02-07 MED ORDER — FOLIC ACID 1 MG PO TABS: 1.0000 mg | ORAL_TABLET | Freq: Every day | ORAL | 0 refills | Status: DC

## 2016-02-07 MED ORDER — RIVAROXABAN (XARELTO) VTE STARTER PACK (15 & 20 MG): ORAL_TABLET | ORAL | 0 refills | Status: DC

## 2016-02-07 MED ORDER — HYDROCODONE-ACETAMINOPHEN 5-325 MG PO TABS: 1.0000 | ORAL_TABLET | Freq: Three times a day (TID) | ORAL | 0 refills | Status: DC | PRN

## 2016-02-07 MED ORDER — OXYBUTYNIN CHLORIDE 5 MG PO TABS: 5.0000 mg | ORAL_TABLET | Freq: Three times a day (TID) | ORAL | 0 refills | Status: DC | PRN

## 2016-02-07 NOTE — Final Consult Note (Signed)
The patient has no further hematuria.  He is straining to urinate-I will stop his anti-spasmodic  We will follow-up as an outpatient.  Please send home on Flomax.

## 2016-02-07 NOTE — Care Management Note (Addendum)
Case Management Note  Patient Details  Name: Melton Krebslvin Winslett MRN: [id number] Date of Birth: 10/02/1965  Subjective/Objective:    DVT                Action/Plan: Discharge Planning: AVS reviewed:  NCM spoke to pt and states [facility] the [facility] when he had DVT and they assisted him with getting medications for free with the manufacture. [facility]. [facility] Jane for hospital follow up appt. Pt was provided with Xarelto 30 day free trial card to get medication.   02/07/2016 1700 Provided pt with MATCH letter and explained to pt that he can use once per year with $3.00 copay.   Expected Discharge Date:  02/07/2016             Expected Discharge Plan:  Home/Self Care  In-House Referral:  Clinical Social Work  Discharge planning Services  CM Consult, Medication Assistance, Indigent Health Clinic  Post Acute Care Choice:  NA Choice offered to:  NA  DME Arranged:  N/A DME Agency:  NA  HH Arranged:  NA HH Agency:  NA  Status of Service:  Completed, signed off  If discussed at [employer] Length of Stay Meetings, dates discussed:    Additional Comments:  Elliot CousinShavis, Abegail Kloeppel Ellen, RN 02/07/2016, 3:23 PM

## 2016-02-07 NOTE — Progress Notes (Signed)
  Subjective: Patient reports that he is straining a bit to urinate.  However, this is a bit better than prior to his hospitalization.  Objective: Vital signs in last 24 hours: Temp:  [97.7 F (36.5 C)-98.4 F (36.9 C)] 98.4 F (36.9 C) (11/25 0535) Pulse Rate:  [71-82] 77 (11/25 0535) Resp:  [18] 18 (11/25 0535) BP: (118-143)/(70-79) 118/77 (11/25 0535) SpO2:  [96 %-100 %] 100 % (11/25 0535)  Intake/Output from previous day: 11/24 0701 - 11/25 0700 In: 480 [P.O.:480] Out: 2025 [Urine:2025] Intake/Output this shift: Total I/O In: 240 [P.O.:240] Out: -   Physical Exam:  Constitutional: Vital signs reviewed. WD WN in NAD   Eyes: PERRL, No scleral icterus.   Pulmonary/Chest: Normal effort Extremities: No cyanosis or edema   Lab Results:  Recent Labs  02/05/16 0340 02/06/16 0321 02/07/16 0540  HGB 11.5* 11.1* 11.3*  HCT 35.5* 34.0* 34.7*   BMET  Recent Labs  02/07/16 0540  NA 139  K 3.8  CL 105  CO2 27  GLUCOSE 96  BUN 15  CREATININE 1.21  CALCIUM 8.9   No results for input(s): LABPT, INR in the last 72 hours. No results for input(s): LABURIN in the last 72 hours. Results for orders placed or performed during the hospital encounter of 04/22/15  Rapid strep screen     Status: None   Collection Time: 04/22/15  1:12 AM  Result Value Ref Range Status   Streptococcus, Group A Screen (Direct) NEGATIVE NEGATIVE Final    Comment: (NOTE) A Rapid Antigen test may result negative if the antigen level in the sample is below the detection level of this test. The FDA has not cleared this test as a stand-alone test therefore the rapid antigen negative result has reflexed to a Group A Strep culture.   Culture, group A strep     Status: None   Collection Time: 04/22/15  1:12 AM  Result Value Ref Range Status   Specimen Description THROAT  Final   Special Requests NONE Reflexed from [id number]  Final   Culture NO GROUP A STREP (S.PYOGENES) ISOLATED  Final   Report  Status 04/24/2015 FINAL  Final    Studies/Results: Dg Chest Port 1 View  Result Date: 02/06/2016 CLINICAL DATA:  Mid chest pain radiating to the left side with shortness of breath EXAM: PORTABLE CHEST 1 VIEW COMPARISON:  02/01/2016 FINDINGS: Mild elevation of the left diaphragm. No acute consolidation or effusion. Heart size upper normal. No pneumothorax. Lucency beneath the left diaphragm is suspected to be secondary to super imposition of air in the stomach and colon. IMPRESSION: 1. Borderline heart size.  No acute infiltrate or edema 2. Lucency beneath the left diaphragm, suspect that this is related to superimposition of air in the stomach and overlying colon. Decubitus film could be obtained as indicated. Electronically Signed   By: Jasmine PangKim  Fujinaga M.D.   On: 02/06/2016 18:49    Assessment/Plan:   He is voiding adequately, although at baseline,/with straining, with no hematuria, with the catheter out.  I will stop his anti-spasmodic, as it may well be slowing his stream down some.  He will need to continue on Flomax.  I'll sign off from a urology standpoint.  We will need to see him in follow-up.  Send home with Flomax.   LOS: 5 days   Marcine MatarDAHLSTEDT, Alaya Iverson M 02/07/2016, 9:35 AM

## 2016-02-07 NOTE — Discharge Instructions (Signed)
Information on my medicine - XARELTO® (rivaroxaban) ° °This medication education was reviewed with me or my healthcare representative as part of my discharge preparation.  The pharmacist that spoke with me during my hospital stay was:  Lateisha Thurlow M, RPH ° °WHY WAS XARELTO® PRESCRIBED FOR YOU? °Xarelto® was prescribed to treat blood clots that may have been found in the veins of your legs (deep vein thrombosis) or in your lungs (pulmonary embolism) and to reduce the risk of them occurring again. ° °What do you need to know about Xarelto®? °The starting dose is one 15 mg tablet taken TWICE daily with food for the FIRST 21 DAYS then on DAY 22,  the dose is changed to one 20 mg tablet taken ONCE A DAY with your evening meal. ° °DO NOT stop taking Xarelto® without talking to the health care provider who prescribed the medication.  Refill your prescription for 20 mg tablets before you run out. ° °After discharge, you should have regular check-up appointments with your healthcare provider that is prescribing your Xarelto®.  In the future your dose may need to be changed if your kidney function changes by a significant amount. ° °What do you do if you miss a dose? °If you are taking Xarelto® TWICE DAILY and you miss a dose, take it as soon as you remember. You may take two 15 mg tablets (total 30 mg) at the same time then resume your regularly scheduled 15 mg twice daily the next day. ° °If you are taking Xarelto® ONCE DAILY and you miss a dose, take it as soon as you remember on the same day then continue your regularly scheduled once daily regimen the next day. Do not take two doses of Xarelto® at the same time.  ° °Important Safety Information °Xarelto® is a blood thinner medicine that can cause bleeding. You should call your healthcare provider right away if you experience any of the following: °? Bleeding from an injury or your nose that does not stop. °? Unusual colored urine (red or dark brown) or unusual colored  stools (red or black). °? Unusual bruising for unknown reasons. °? A serious fall or if you hit your head (even if there is no bleeding). ° °Some medicines may interact with Xarelto® and might increase your risk of bleeding while on Xarelto®. To help avoid this, consult your healthcare provider or pharmacist prior to using any new prescription or non-prescription medications, including herbals, vitamins, non-steroidal anti-inflammatory drugs (NSAIDs) and supplements. ° °This website has more information on Xarelto®: www.xarelto.com. °

## 2016-02-07 NOTE — Discharge Summary (Signed)
Physician Discharge Summary  Peter Washington RUE:[id number] DOB: 10/14/1965 DOA: 02/01/2016  PCP: No primary care provider on file.  Admit date: 02/01/2016 Discharge date: 02/07/2016  Recommendations for Outpatient Follow-up:  Continue xarelto on discharge. As instructed, take 15 mg twice a day for 21 days and then on day 22 start 20 mg a day. Day 21 for you is 12/14 so on 02/27/2016 start xarelto 20 mg daily.  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 He has known history of DVTs and PEs but has not been taking his xareltofor the past 1 year. He has been seen by hematology who recommended xarelto on discharge (now on heparin drip). Vascular [facility] LE [facility]  Hospital Course:    Assessment & Plan:  Principal Problem: Acute right and left lower extremity DVT (deep venous thrombosis) / Protein C deficiency  - LE doppler 11/19 showed an extensive, non occluisve,highly acute DVT noted in the left common femoral, femoral, popliteal, posterior tibial, and peroneal veins. The thrombus is mobile in the left common femoral vein. There is acute, non occlusive DVT noted in the right femoral, femoral and popliteal veins - Per vasc surgery, okay to  sch [facility] (if he remains in [facility] until next [facility]) - Plan for LE venography next week (in or outpt) - Stable hgb and platelets - Seen by hematology - no further AC work up required as it will not change the fact he needs AC in long run, can resume xarelto on discharge  - Started xarelto, so far tolerates well  Active Problems:   Hematuria - Appreciate GI seeing the pt in consultation - Hgb stable - No blood in urine   Chest pain - No PE on imaging - Resolved   Tobacco use - Nicotine patch ordered  - Counseled on smoking cessation     Dehydration - Resolved with fluids    Urinary retention / BPH  - Foley placed 11/21; had 1 L in volume bladder  - Removed foley 11/24 - Seen by GU - Continue Flomax     Constipation - Resolved   Cocaine dependence, continuous  - Advised to discontinue  Depression - Continue Abilify, Celexa, Trazodone   DVT prophylaxis: xarelto started 11/23; prior to that he was on heparin drip  Code Status: full code  Family Communication: no family at the bedside this am    Consultants:   Vascular surgery  Hematology  Urology   Procedures:   LE doppler 02/01/2016 -  There is extensive, non occluisve,highly acute DVT noted in the left common femoral, femoral, popliteal, posterior tibial, and peroneal veins. The thrombus is mobile in the left common femoral vein. There is acute, non occlusive DVT noted in the right femoral, femoral  and popliteal veins  Antimicrobials:   None    Signed:  Manson Passey, MD  [facility] 02/07/2016, 9:56 AM  Pager #: [phone]  Time spent in minutes: less than 30 minutes    Discharge Exam: Vitals:   02/06/16 2150 02/07/16 0535  BP: 121/70 118/77  Pulse: 78 77  Resp: 18 18  Temp: 98.3 F (36.8 C) 98.4 F (36.9 C)   Vitals:   02/06/16 1500 02/06/16 1800 02/06/16 2150 02/07/16 0535  BP: (!) 143/79  121/70 118/77   Pulse: 71 82 78 77  Resp: 18 18 18 18   Temp: 97.7 F (36.5 C)  98.3 F (36.8 C) 98.4 F (36.9 C)  TempSrc: Oral  Oral Oral  SpO2: 100% 100% 96% 100%  Weight:      Height:        General: Pt is alert, follows commands appropriately, not in acute distress Cardiovascular: Regular rate and rhythm, S1/S2 +, no murmurs Respiratory: Clear to auscultation bilaterally, no wheezing, no crackles, no rhonchi Abdominal: Soft, non tender, non distended, bowel sounds +, no guarding Extremities: no cyanosis, pulses palpable bilaterally DP and PT Neuro: Grossly nonfocal  Discharge Instructions  Discharge Instructions    Call MD for:  persistant nausea and vomiting    Complete by:  As directed    Call MD for:  redness, tenderness, or signs of infection (pain, swelling, redness, odor or green/yellow discharge around incision site)    Complete by:  As directed    Call MD for:  severe uncontrolled pain    Complete by:  As directed    Diet - low sodium heart healthy    Complete by:  As directed    Discharge instructions    Complete by:  As directed    Continue xarelto on discharge. As instructed, take 15 mg twice a day for 21 days and then on day 22 start 20 mg a day. Day 21 for you is 12/14 so on 02/27/2016 start xarelto 20 mg daily.   Increase activity slowly    Complete by:  As directed        Medication List    TAKE these medications   acetaminophen 325 MG tablet Commonly known as:  TYLENOL Take 2 tablets (650 mg total) by mouth every 6 (six) hours as needed for mild pain (or Fever >/= 101).   ARIPiprazole 5 MG tablet Commonly known as:  ABILIFY Take 1 tablet (5 mg total) by mouth daily.   citalopram 20 MG tablet Commonly known as:  CELEXA Take 1 tablet (20 mg total) by mouth at bedtime.   famotidine 40 MG tablet Commonly known as:  PEPCID Take 1 tablet (40 mg total) by mouth daily.   folic acid 1 MG tablet Commonly known as:  FOLVITE Take 1 tablet (1 mg total) by mouth  daily. Start taking on:  02/08/2016   HYDROcodone-acetaminophen 5-325 MG tablet Commonly known as:  NORCO Take 1 tablet by mouth every 8 (eight) hours as needed for moderate pain.   oxybutynin 5 MG tablet Commonly known as:  DITROPAN Take 1 tablet (5 mg total) by mouth every 8 (eight) hours as needed for bladder spasms.   Rivaroxaban 15 & 20 MG Tbpk Take as directed on package: Start with one 15mg  tablet by mouth twice a day with food. On Day 22, switch to one 20mg  tablet once a day with food.   tamsulosin 0.4 MG Caps capsule Commonly known as:  FLOMAX Take 1 capsule (0.4 mg  total) by mouth daily after supper.   traZODone 100 MG tablet Commonly known as:  DESYREL Take 1 tablet (100 mg total) by mouth at bedtime.      Follow-up Information     [facility] Follow up on 03/01/2016.   Why:  appointment at 9:30 AM, please take discharge papers and all medication with you to appoinment.  Contact information: [address] [place] [phone]       Chelsea AusAHLSTEDT, STEPHEN M, MD. Call.   Specialty:  Urology Why:  Call for appointment to be seen next available.  Tell the scheduler that you are following up after hospitalization. Contact information: [address] [place] [address] [phone]            The results of significant diagnostics from this hospitalization (including imaging, microbiology, ancillary and laboratory) are listed below for reference.    Significant Diagnostic Studies: Dg Chest 2 View  Result Date: 02/01/2016 CLINICAL DATA:  Shortness of breath and chest pain for 2 days. EXAM: CHEST  2 VIEW COMPARISON:  04/21/2015 and prior chest radiographs FINDINGS: The cardiomediastinal silhouette is unremarkable. There is no evidence of focal airspace disease, pulmonary edema, suspicious pulmonary nodule/mass, pleural effusion, or pneumothorax. No acute bony abnormalities are identified. IMPRESSION: No active cardiopulmonary  disease. Electronically Signed   By: Harmon PierJeffrey  Hu M.D.   On: 02/01/2016 14:24   Ct Angio Chest Pe W And/or Wo Contrast  Result Date: 02/01/2016 CLINICAL DATA:  Chest pain and bilateral calf tenderness. History of deep venous thrombosis and pulmonary embolus. Symptoms began 2 days ago. EXAM: CT ANGIOGRAPHY CHEST WITH CONTRAST TECHNIQUE: Multidetector CT imaging of the chest was performed using the standard protocol during bolus administration of intravenous contrast. Multiplanar CT image reconstructions and MIPs were obtained to evaluate the vascular anatomy. CONTRAST:  100 cc Isovue 370. COMPARISON:  CT chest 01/09/2016. FINDINGS: Cardiovascular: No pulmonary embolus is identified. Heart size is normal. No pericardial effusion. Mediastinum/Nodes: Small calcified right hilar lymph nodes are noted. Otherwise negative. Lungs/Pleura: No pleural effusion. There is some dependent atelectasis bilaterally. Airways are unremarkable. Upper Abdomen: Negative. Musculoskeletal: Scattered thoracic spondylosis is identified. No lytic or sclerotic lesion. Review of the MIP images confirms the above findings. IMPRESSION: Negative for pulmonary embolus.  No acute disease. Electronically Signed   By: Drusilla Kannerhomas  Dalessio M.D.   On: 02/01/2016 16:08   Ct Angio Chest Pe W And/or Wo Contrast  Result Date: 01/09/2016 CLINICAL DATA:  Mid chest pain, right side chest pain with tip breathing starting this morning EXAM: CT ANGIOGRAPHY CHEST WITH CONTRAST TECHNIQUE: Multidetector CT imaging of the chest was performed using the standard protocol during bolus administration of intravenous contrast. Multiplanar CT image reconstructions and MIPs were obtained to evaluate the vascular anatomy. CONTRAST:  100 cc Isovue COMPARISON:  CT chest 12/25/2015 FINDINGS: Cardiovascular: Heart size within normal limits. No pericardial effusion. The study is of excellent technical quality. No pulmonary embolus is noted. No aortic aneurysm or aortic  dissection. Mediastinum/Nodes: No mediastinal hematoma or adenopathy. Visualized esophagus is unremarkable. No hilar adenopathy. Lungs/Pleura: No infiltrate or pleural effusion. No pulmonary edema. Mild atelectasis noted bilateral lower lobe posteriorly. Mild atelectasis posterior to the left major fissure. No pneumothorax. No bronchiectasis. A single emphysematous bulla is noted in lingula. Upper Abdomen: The visualized upper abdomen is unremarkable. Musculoskeletal: No destructive bony lesions are noted. Sagittal images of the spine shows degenerative changes thoracic spine. Sagittal view of the sternum is unremarkable. Review of the  MIP images confirms the above findings. IMPRESSION: 1. No pulmonary embolus.  No aortic aneurysm or dissection. 2. No mediastinal hematoma or adenopathy. 3. Mild atelectasis bilateral lower lobe posteriorly. 4. Degenerative changes thoracolumbar spine. Electronically Signed   By: Natasha Mead M.D.   On: 01/09/2016 09:01   Mr Laqueta Jean JX Contrast  Result Date: 01/13/2016 CLINICAL DATA:  50 year old male with episodes of hallucinations. Alcohol, cocaine and marijuana use. Initial encounter. EXAM: MRI HEAD WITHOUT AND WITH CONTRAST TECHNIQUE: Multiplanar, multiecho pulse sequences of the brain and surrounding structures were obtained without and with intravenous contrast. CONTRAST:  14mL MULTIHANCE GADOBENATE DIMEGLUMINE 529 MG/ML IV SOLN COMPARISON:  03/04/2014 head CT. FINDINGS: Brain: No acute or intracranial hemorrhage. No intracranial mass or abnormal enhancement. Mild global atrophy without hydrocephalus. Vascular: Major intracranial vascular structures are patent. Skull and upper cervical spine: Negative. Sinuses/Orbits: No acute orbital abnormality. Minimal mucosal thickening maxillary sinuses and ethmoid sinus air cells. Other: Negative IMPRESSION: Mild global atrophy. No intracranial mass or abnormal enhancement. Electronically Signed   By: Lacy Duverney M.D.   On: 01/13/2016  20:33   Dg Chest Port 1 View  Result Date: 02/06/2016 CLINICAL DATA:  Mid chest pain radiating to the left side with shortness of breath EXAM: PORTABLE CHEST 1 VIEW COMPARISON:  02/01/2016 FINDINGS: Mild elevation of the left diaphragm. No acute consolidation or effusion. Heart size upper normal. No pneumothorax. Lucency beneath the left diaphragm is suspected to be secondary to super imposition of air in the stomach and colon. IMPRESSION: 1. Borderline heart size.  No acute infiltrate or edema 2. Lucency beneath the left diaphragm, suspect that this is related to superimposition of air in the stomach and overlying colon. Decubitus film could be obtained as indicated. Electronically Signed   By: Jasmine Pang M.D.   On: 02/06/2016 18:49    Microbiology: No results found for this or any previous visit (from the past 240 hour(s)).   Labs: Basic Metabolic Panel:  Recent Labs Lab 02/01/16 1400 02/01/16 1615 02/02/16 0047 02/03/16 0326 02/04/16 0350 02/07/16 0540  NA 139  --  138 138 142 139  K 3.8  --  3.6 3.7 4.1 3.8  CL 106  --  108 108 109 105  CO2 29  --  25 25 28 27   GLUCOSE 88  --  137* 99 90 96  BUN 17  --  24* 27* 17 15  CREATININE 1.20  --  1.15 1.11 1.14 1.21  CALCIUM 8.8*  --  8.3* 8.2* 8.7* 8.9  MG  --  2.2 2.2  --   --   --   PHOS  --  3.3 4.7*  --   --   --    Liver Function Tests:  Recent Labs Lab 02/01/16 2000 02/02/16 0047  AST 22 17  ALT 20 16*  ALKPHOS 48 46  BILITOT 0.9 0.6  PROT 6.8 5.7*  ALBUMIN 3.9 3.4*   No results for input(s): LIPASE, AMYLASE in the last 168 hours. No results for input(s): AMMONIA in the last 168 hours. CBC:  Recent Labs Lab 02/03/16 0326 02/04/16 0350 02/05/16 0340 02/06/16 0321 02/07/16 0540  WBC 5.0 6.9 7.8 7.9 7.4  HGB 11.1* 11.7* 11.5* 11.1* 11.3*  HCT 34.1* 35.8* 35.5* 34.0* 34.7*  MCV 79.7 80.6 80.1 78.9 79.6  PLT 202 227 233 211 213   Cardiac Enzymes: No results for input(s): CKTOTAL, CKMB, CKMBINDEX,  TROPONINI in the last 168 hours. BNP: BNP (last 3 results) No  results for input(s): BNP in the last 8760 hours.  ProBNP (last 3 results) No results for input(s): PROBNP in the last 8760 hours.  CBG: No results for input(s): GLUCAP in the last 168 hours.

## 2016-02-11 ENCOUNTER — Encounter (HOSPITAL_COMMUNITY): Payer: Self-pay

## 2016-02-11 ENCOUNTER — Ambulatory Visit (HOSPITAL_COMMUNITY): Admit: 2016-02-11 | Payer: Self-pay | Admitting: Vascular Surgery

## 2016-02-11 SURGERY — LOWER EXTREMITY ANGIOGRAPHY

## 2016-03-01 ENCOUNTER — Ambulatory Visit: Payer: Self-pay | Admitting: Family Medicine

## 2016-04-03 ENCOUNTER — Observation Stay (HOSPITAL_COMMUNITY)
Admission: EM | Admit: 2016-04-03 | Discharge: 2016-04-05 | Disposition: A | Discharge status: Home / Self Care | Payer: Self-pay | Attending: Internal Medicine | Admitting: Internal Medicine

## 2016-04-03 ENCOUNTER — Emergency Department (HOSPITAL_COMMUNITY): Payer: Self-pay

## 2016-04-03 ENCOUNTER — Encounter (HOSPITAL_COMMUNITY): Payer: Self-pay | Admitting: Emergency Medicine

## 2016-04-03 DIAGNOSIS — R079 Chest pain, unspecified: Principal | ICD-10-CM | POA: Diagnosis present

## 2016-04-03 DIAGNOSIS — I82409 Acute embolism and thrombosis of unspecified deep veins of unspecified lower extremity: Secondary | ICD-10-CM | POA: Diagnosis present

## 2016-04-03 DIAGNOSIS — R0789 Other chest pain: Secondary | ICD-10-CM

## 2016-04-03 DIAGNOSIS — Z79899 Other long term (current) drug therapy: Secondary | ICD-10-CM | POA: Insufficient documentation

## 2016-04-03 DIAGNOSIS — Z91018 Allergy to other foods: Secondary | ICD-10-CM

## 2016-04-03 DIAGNOSIS — F17210 Nicotine dependence, cigarettes, uncomplicated: Secondary | ICD-10-CM | POA: Insufficient documentation

## 2016-04-03 DIAGNOSIS — J3489 Other specified disorders of nose and nasal sinuses: Secondary | ICD-10-CM

## 2016-04-03 DIAGNOSIS — R51 Headache: Secondary | ICD-10-CM

## 2016-04-03 DIAGNOSIS — Z720 Tobacco use: Secondary | ICD-10-CM | POA: Diagnosis present

## 2016-04-03 DIAGNOSIS — D6859 Other primary thrombophilia: Secondary | ICD-10-CM | POA: Diagnosis present

## 2016-04-03 DIAGNOSIS — F1420 Cocaine dependence, uncomplicated: Secondary | ICD-10-CM | POA: Diagnosis present

## 2016-04-03 DIAGNOSIS — R071 Chest pain on breathing: Secondary | ICD-10-CM

## 2016-04-03 DIAGNOSIS — Z7901 Long term (current) use of anticoagulants: Secondary | ICD-10-CM | POA: Insufficient documentation

## 2016-04-03 DIAGNOSIS — Z86711 Personal history of pulmonary embolism: Secondary | ICD-10-CM | POA: Diagnosis present

## 2016-04-03 DIAGNOSIS — I82412 Acute embolism and thrombosis of left femoral vein: Secondary | ICD-10-CM

## 2016-04-03 DIAGNOSIS — R0981 Nasal congestion: Secondary | ICD-10-CM

## 2016-04-03 DIAGNOSIS — Z91011 Allergy to milk products: Secondary | ICD-10-CM

## 2016-04-03 DIAGNOSIS — Z590 Homelessness: Secondary | ICD-10-CM

## 2016-04-03 LAB — INFLUENZA PANEL BY PCR (TYPE A & B)
Influenza A By PCR: NEGATIVE
Influenza B By PCR: NEGATIVE

## 2016-04-03 LAB — RAPID URINE DRUG SCREEN, HOSP PERFORMED
Amphetamines: NOT DETECTED
BARBITURATES: NOT DETECTED
BENZODIAZEPINES: NOT DETECTED
Cocaine: NOT DETECTED
Opiates: NOT DETECTED
Tetrahydrocannabinol: NOT DETECTED

## 2016-04-03 LAB — CBC
HCT: 36.9 % — ABNORMAL LOW (ref 39.0–52.0)
Hemoglobin: 11.9 g/dL — ABNORMAL LOW (ref 13.0–17.0)
MCH: 25.9 pg — ABNORMAL LOW (ref 26.0–34.0)
MCHC: 32.2 g/dL (ref 30.0–36.0)
MCV: 80.4 fL (ref 78.0–100.0)
PLATELETS: 235 10*3/uL (ref 150–400)
RBC: 4.59 MIL/uL (ref 4.22–5.81)
RDW: 14.8 % (ref 11.5–15.5)
WBC: 9.6 10*3/uL (ref 4.0–10.5)

## 2016-04-03 LAB — BASIC METABOLIC PANEL
Anion gap: 8 (ref 5–15)
BUN: 9 mg/dL (ref 6–20)
CALCIUM: 9.2 mg/dL (ref 8.9–10.3)
CO2: 25 mmol/L (ref 22–32)
CREATININE: 1.11 mg/dL (ref 0.61–1.24)
Chloride: 106 mmol/L (ref 101–111)
Glucose, Bld: 95 mg/dL (ref 65–99)
Potassium: 4 mmol/L (ref 3.5–5.1)
SODIUM: 139 mmol/L (ref 135–145)

## 2016-04-03 LAB — I-STAT TROPONIN, ED
TROPONIN I, POC: 0 ng/mL (ref 0.00–0.08)
TROPONIN I, POC: 0 ng/mL (ref 0.00–0.08)

## 2016-04-03 LAB — SEDIMENTATION RATE: SED RATE: 36 mm/h — AB (ref 0–16)

## 2016-04-03 MED ORDER — FAMOTIDINE 20 MG PO TABS
40.0000 mg | ORAL_TABLET | Freq: Every day | ORAL | Status: DC
  Administered 2016-04-04 – 2016-04-05 (×2): 40 mg via ORAL
  Filled 2016-04-03 (×2): qty 2

## 2016-04-03 MED ORDER — HYDROCODONE-ACETAMINOPHEN 5-325 MG PO TABS
1.0000 | ORAL_TABLET | Freq: Once | ORAL | Status: AC
  Administered 2016-04-03: 1 via ORAL
  Filled 2016-04-03: qty 1

## 2016-04-03 MED ORDER — FLUTICASONE PROPIONATE 50 MCG/ACT NA SUSP
2.0000 | Freq: Every day | NASAL | Status: DC
  Administered 2016-04-03 – 2016-04-05 (×3): 2 via NASAL
  Filled 2016-04-03: qty 16

## 2016-04-03 MED ORDER — APIXABAN 5 MG PO TABS
5.0000 mg | ORAL_TABLET | Freq: Two times a day (BID) | ORAL | Status: DC
  Administered 2016-04-04 – 2016-04-05 (×4): 5 mg via ORAL
  Filled 2016-04-03 (×4): qty 1

## 2016-04-03 MED ORDER — ACETAMINOPHEN 325 MG PO TABS
650.0000 mg | ORAL_TABLET | ORAL | Status: DC | PRN
  Administered 2016-04-04 – 2016-04-05 (×2): 650 mg via ORAL
  Filled 2016-04-03 (×3): qty 2

## 2016-04-03 MED ORDER — ONDANSETRON HCL 4 MG/2ML IJ SOLN: 4.0000 mg | Freq: Four times a day (QID) | INTRAMUSCULAR | Status: DC | PRN

## 2016-04-03 MED ORDER — PSEUDOEPHEDRINE HCL ER 120 MG PO TB12
120.0000 mg | ORAL_TABLET | Freq: Two times a day (BID) | ORAL | Status: DC
  Administered 2016-04-03 – 2016-04-05 (×4): 120 mg via ORAL
  Filled 2016-04-03 (×5): qty 1

## 2016-04-03 MED ORDER — KETOROLAC TROMETHAMINE 30 MG/ML IJ SOLN
30.0000 mg | Freq: Once | INTRAMUSCULAR | Status: AC
  Administered 2016-04-03: 30 mg via INTRAVENOUS
  Filled 2016-04-03: qty 1

## 2016-04-03 MED ORDER — GI COCKTAIL ~~LOC~~: 30.0000 mL | Freq: Four times a day (QID) | ORAL | Status: DC | PRN

## 2016-04-03 MED ORDER — LORATADINE 10 MG PO TABS
10.0000 mg | ORAL_TABLET | Freq: Every day | ORAL | Status: DC
  Administered 2016-04-03 – 2016-04-04 (×2): 10 mg via ORAL
  Filled 2016-04-03 (×2): qty 1

## 2016-04-03 NOTE — ED Provider Notes (Signed)
[facility] DEPT Provider Note   CSN: [id number] Arrival date & time: 04/03/16  1319     History   Chief Complaint Chief Complaint  Patient presents with  . Jaw Pain  . Chest Pain    HPI Peter Washington is a 51 y.o. male.  HPI 51 year old male with a history of protein C deficiency with chronic DVT and history of PE on eliquis presenting with 2 weeks of worsening chest pain and jaw pain. He states that his jaw pain started in her right shoulder has now moved to his left jaw. He has pain upon opening his mouth and a diffuse headache. Denies any numbness or weakness. He is also noted multiple nosebleeds in the past 2 weeks with occasional cough with black sputum. Denies hematemesis. Denies thumb. He states that his chest pain is worse on deep inspiration and is not exertional. Denies shortness of breath, nausea vomiting. He states that he is compliant with his meds.  Past Medical History:  Diagnosis Date  . DVT of lower extremity (deep venous thrombosis) ([place])   . PE (pulmonary embolism)     Patient Active Problem List   Diagnosis Date Noted  . Chest pain at rest 04/03/2016  . Sinus congestion 04/03/2016  . Lower leg DVT (deep venous thromboembolism), acute, bilateral ([place])   . Depression 02/02/2016  . DVT (deep venous thrombosis) ([place]) 02/02/2016  . Cocaine dependence, continuous ([place]) 01/19/2016  . 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 History of pulmonary embolus (PE) 01/16/2014  . Tobacco use 01/16/2014  . Multiple pulmonary emboli ([place]) 01/16/2014  . Protein C deficiency ([place]) 12/31/2013  . Acute DVT (deep venous thrombosis) ([place]) 12/29/2013  . Pleuritic chest pain 12/29/2013  . Dyspnea 12/29/2013  . Chest pain 12/29/2013    History reviewed. No pertinent surgical history.     Home Medications    Prior to Admission medications     Medication Sig Start Date End Date Taking? Authorizing Provider  apixaban (ELIQUIS) 5 MG TABS tablet Take 5 mg by mouth 2 (two) times daily.   Yes Historical Provider, MD  ibuprofen (ADVIL,MOTRIN) 200 MG tablet Take 200 mg by mouth every 6 (six) hours as needed (pain).   Yes Historical Provider, MD  acetaminophen (TYLENOL) 325 MG tablet Take 2 tablets (650 mg total) by mouth every 6 (six) hours as needed for mild pain (or Fever >/= 101). Patient not taking: Reported on 04/03/2016 02/07/16   Robbie Lis, MD  ARIPiprazole (ABILIFY) 5 MG tablet Take 1 tablet (5 mg total) by mouth daily. Patient not taking: Reported on 04/03/2016 01/20/16   Kerrie Buffalo, NP  citalopram (CELEXA) 20 MG tablet Take 1 tablet (20 mg total) by mouth at bedtime. Patient not taking: Reported on 04/03/2016 01/19/16   Kerrie Buffalo, NP  famotidine (PEPCID) 40 MG tablet Take 1 tablet (40 mg total) by mouth daily. Patient not taking: Reported on 04/03/2016 02/07/16   Robbie Lis, MD  folic acid (FOLVITE) 1 MG tablet Take 1 tablet (1 mg total) by mouth daily. Patient not taking: Reported on 04/03/2016 02/08/16   Robbie Lis, MD  HYDROcodone-acetaminophen [facility]) 5-325 MG tablet Take 1 tablet by mouth every 8 (eight) hours as needed for moderate pain. Patient not taking: Reported on 04/03/2016 02/07/16   Robbie Lis, MD  oxybutynin (DITROPAN) 5 MG tablet Take 1 tablet (5 mg total) by mouth every 8 (eight) hours  as needed for bladder spasms. Patient not taking: Reported on 04/03/2016 02/07/16   Robbie Lis, MD  Rivaroxaban 15 & 20 MG TBPK Take as directed on package: Start with one 58m tablet by mouth twice a day with food. On Day 22, switch to one 231mtablet once a day with food. Patient not taking: Reported on 04/03/2016 02/07/16   AlRobbie LisMD  tamsulosin (FLOMAX) 0.4 MG CAPS capsule Take 1 capsule (0.4 mg total) by mouth daily after supper. Patient not taking: Reported on 04/03/2016 02/07/16   AlRobbie LisMD   traZODone (DESYREL) 100 MG tablet Take 1 tablet (100 mg total) by mouth at bedtime. Patient not taking: Reported on 04/03/2016 01/19/16   ShKerrie BuffaloNP    Family History Family History  Problem Relation Age of Onset  . Diabetes Neg Hx   . Stroke Neg Hx   . Cancer Neg Hx   . Clotting disorder Neg Hx     Social History Social History  Substance Use Topics  . Smoking status: Light Tobacco Smoker    Packs/day: 0.25    Types: Cigarettes  . Smokeless tobacco: Never Used  . Alcohol use Yes     Comment: 3 beers on weekends     Allergies   Lactose intolerance (gi) and Pork-derived products   Review of Systems Review of Systems  Constitutional: Negative for chills and fever.  HENT: Negative for ear pain, sore throat, trouble swallowing and voice change.   Eyes: Negative for pain and visual disturbance.  Respiratory: Positive for cough. Negative for shortness of breath.   Cardiovascular: Positive for chest pain. Negative for palpitations.  Gastrointestinal: Negative for abdominal pain, constipation, diarrhea, nausea and vomiting.  Genitourinary: Negative for dysuria and hematuria.  Musculoskeletal: Negative for arthralgias, back pain, neck pain and neck stiffness.  Skin: Negative for color change and rash.  Neurological: Positive for headaches. Negative for seizures, syncope, weakness and numbness.  All other systems reviewed and are negative.    Physical Exam Updated Vital Signs BP 117/66   Pulse 69   Temp 98.3 F (36.8 C) (Oral)   Resp 21   Ht 5' 7" (1.702 m)   Wt 70.3 kg   SpO2 99%   BMI 24.28 kg/m   Physical Exam  Constitutional: He appears well-developed and well-nourished.  HENT:  Head: Normocephalic and atraumatic.  Mouth/Throat: Uvula is midline, oropharynx is clear and moist and mucous membranes are normal. No oral lesions. No trismus in the jaw. No dental abscesses or dental caries.  TTP over L temporal artery  Eyes: Conjunctivae are normal.  Neck:  Normal range of motion. Neck supple.  Cardiovascular: Normal rate and regular rhythm.   No murmur heard. Pulmonary/Chest: Effort normal and breath sounds normal. No respiratory distress. He has no wheezes. He has no rales. He exhibits no tenderness.  Abdominal: Soft. There is no tenderness.  Musculoskeletal: He exhibits no edema.  Neurological: He is alert.  Skin: Skin is warm and dry.  Psychiatric: He has a normal mood and affect.  Nursing note and vitals reviewed.    ED Treatments / Results  Labs (all labs ordered are listed, but only abnormal results are displayed) Labs Reviewed  CBC - Abnormal; Notable for the following:       Result Value   Hemoglobin 11.9 (*)    HCT 36.9 (*)    MCH 25.9 (*)    All other components within normal limits  SEDIMENTATION RATE - Abnormal; Notable  for the following:    Sed Rate 36 (*)    All other components within normal limits  BASIC METABOLIC PANEL  RAPID URINE DRUG SCREEN, HOSP PERFORMED  CBC  COMPREHENSIVE METABOLIC PANEL  INFLUENZA PANEL BY PCR (TYPE A & B)  I-STAT TROPOININ, ED  I-STAT TROPOININ, ED    EKG  EKG Interpretation  Date/Time:  Saturday April 03 2016 13:52:33 EST Ventricular Rate:  75 PR Interval:  122 QRS Duration: 84 QT Interval:  354 QTC Calculation: 395 R Axis:   86 Text Interpretation:  Normal sinus rhythm Nonspecific T wave abnormality Abnormal ECG Confirmed by Reather Converse MD, JOSHUA [phone]) on 04/03/2016 3:21:25 PM       Radiology Dg Chest 2 View  Result Date: 04/03/2016 CLINICAL DATA:  Two day history of jaw pain and chest pain. History of pulmonary embolism. EXAM: CHEST  2 VIEW COMPARISON:  02/06/2016 FINDINGS: Heart size is normal. Mediastinal shadows are normal. The lungs are clear. No bronchial thickening. No infiltrate, mass, effusion or collapse. Pulmonary vascularity is normal. Ordinary mild spinal degenerative changes. IMPRESSION: Normal chest Electronically Signed   By: Nelson Chimes M.D.   On:  04/03/2016 15:27    Procedures Procedures (including critical care time)  Medications Ordered in ED Medications  pseudoephedrine (SUDAFED) 12 hr tablet 120 mg (not administered)  fluticasone (FLONASE) 50 MCG/ACT nasal spray 2 spray (not administered)  loratadine (CLARITIN) tablet 10 mg (not administered)  HYDROcodone-acetaminophen (NORCO/VICODIN) 5-325 MG per tablet 1 tablet (1 tablet Oral Given 04/03/16 1653)     Initial Impression / Assessment and Plan / ED Course  I have reviewed the triage vital signs and the nursing notes.  Pertinent labs & imaging results that were available during my care of the patient were reviewed by me and considered in my medical decision making (see chart for details).    52 year old male presenting with chest pain, jaw pain, headache for 2 days has been gradually worsening. States pain is pleuritic in nature. reports throbbing jaw pain with headache. EKG with new T-wave inversions of the lateral leads and V6. Chest x-ray unremarkable. CBC, BMP, troponin ordered at triage. Initial troponin negative and labs grossly unremarkable.  ESR added for concern of temporal arteritis. Patient has a nonfocal neuro exam, doubt intracranial abnormality. 3 hour troponin ordered and patient will be admitted for ACS rule out due to EKG changes. Further imaging for concern of PE not ordered due to patient's hemodynamic stability and current anticoagulation status as imaging would not impact or change management. Patient will be admitted to the internal medicine service for further evaluation and ACS rule out  Patient care discussed and supervised by my attending, Dr. Reather Converse. Drucie Ip, MD   Final Clinical Impressions(s) / ED Diagnoses   Final diagnoses:  None    New Prescriptions New Prescriptions   No medications on file     Shareta Fishbaugh Mali Chanoch Mccleery, MD 04/03/16 [id number]    Elnora Morrison, MD 04/05/16 [phone]

## 2016-04-03 NOTE — ED Triage Notes (Signed)
Pt. Stated, Im having jaw pain that goes from one side to the other and some chest pain for 2 days.

## 2016-04-03 NOTE — ED Notes (Signed)
Pt ambulated to restroom. 

## 2016-04-03 NOTE — ED Notes (Signed)
Attempted report at this time.  Nurse to call back when available. 

## 2016-04-03 NOTE — H&P (Signed)
Date: 04/03/2016               Patient Name:  Peter Washington MRN: [id number]  DOB: 1966-02-14 Age / Sex: 51 y.o., male   PCP: Pcp Not In System         Medical Service: Internal Medicine Teaching Service         Attending Physician: Dr. Sid Falcon, MD    First Contact: Dr. Hetty Ely Pager: [phone]  Second Contact: Dr. Juleen China  Pager: [phone]       After Hours (After 5p/  First Contact Pager: [phone]  weekends / holidays): Second Contact Pager: [phone]   Chief Complaint: sinus congestion and chest pain   History of Present Illness: Peter Washington is a 51 y.o. male with a PMH of factor C deficiency, DVTs, and PEs on eliquis, depression, polysubstance abuse who presents with chest pain and shortness of breath. About two weeks ago he began having left sided chest pain, which is sharp and associated with left arm numbness. The pain changes in severity throughout the day and is worse at night and with coughing. About a week ago he developed right sided facial numbness and pain which became left sided facial numbness and pain about a day later. This facial pain was accompanied by sinus congestion and pain, puffy eyes, sore throat, runny nose, sweating, coughing up black sputum and joint pain. He denies nausea, vomiting, or diarrhea. Chest wall tenderness over the same area of chest pain began about 1 day ago. All of his symptoms became worse in the past two days so he presented to the ED. He tried tylenol and advil but these did not work. He did not get a flu shot this year.  He has been living in a shelter for the past two weeks and may have had many sick contacts.   Meds:  Current Meds  Medication Sig  . apixaban (ELIQUIS) 5 MG TABS tablet Take 5 mg by mouth 2 (two) times daily.  Marland Kitchen ibuprofen (ADVIL,MOTRIN) 200 MG tablet Take 200 mg by mouth every 6 (six) hours as needed (pain).     Allergies: Allergies as of 04/03/2016 - Review Complete 04/03/2016  Allergen Reaction Noted  . Lactose  intolerance (gi) Other (See Comments) 04/03/2016  . Pork-derived products Nausea And Vomiting 03/04/2014   Past Medical History:  Diagnosis Date  . DVT of lower extremity (deep venous thrombosis) ([place])   . PE (pulmonary embolism)     Family History:  Family History  Problem Relation Age of Onset  . Diabetes Neg Hx   . Stroke Neg Hx   . Cancer Neg Hx   . Clotting disorder Neg Hx    Social History:  Has smoked cigarettes since age 58, currently smokes 1 cigarette per day and has smoked up to 5 cigarettes per day in the past. Denies alcohol use. Uses cocaine, says he last used 90 days ago.   Review of Systems: A complete ROS was negative except as per HPI.   Physical Exam: Blood pressure 120/77, pulse 66, temperature 98.3 F (36.8 C), temperature source Oral, resp. rate 18, height 5' 7" (1.702 m), weight 155 lb (70.3 kg), SpO2 98 %. Physical Exam  Constitutional: He is oriented to person, place, and time. He appears well-developed and well-nourished. No distress.  HENT:  Head: Normocephalic and atraumatic.  Eyes: Conjunctivae are normal. Scleral icterus is present.  Bilateral puffy eyes, tenderness to palpation over frontal sinuses and temporal head  Cardiovascular: Normal rate and regular rhythm.   No murmur heard. Pulmonary/Chest: Effort normal and breath sounds normal. No respiratory distress. He has no wheezes. He has no rales. He exhibits tenderness.  Abdominal: Soft. He exhibits no distension. There is no tenderness.  Neurological: He is alert and oriented to person, place, and time. No cranial nerve deficit. He exhibits normal muscle tone.  Normal strength in upper and lower extremities   Skin: Skin is warm and dry. He is not diaphoretic.  Psychiatric: He has a normal mood and affect. His behavior is normal.    Labs  BMP Na 139, K 4.0, CO2 25, BUN 9, Crt 1.11, GFR >60  CBC WBC WBC 9.6, Hgb 11.9, Plt 235 Trop poc 0.00 > 0.00   ESR 36 UDS - negative   EKG: Normal  sinus rhythm, T wave inversions II, III, V5, and V6   CXR:Personal review of the chest xray reveals no acute cardiopulmonary disease  Assessment & Plan by Problem:  Chest pain at rest 50 y.o. male with a PMH of factor C deficiency, DVTs, and PEs on eliquis, depression, polysubstance abuse who presents with chest pain and shortness of breath. Wells score for PE 5.5, will continue his home medication eliquis which will cover the possibility of this. HEART score 1 correlates with low risk for ACS.  -continue home med eliquis -follow up morning EKG -trending final troponin  -admit to telemetry  -trial of GI cocktail, famotidine, and zofran  Sinus congestion Headache  Symptoms are most consistent with sinus congestion. ESR 36, upper limit of normal for his age is 26. We have considered GCA in our differential but feel more convinced that it may be related to sinus congestion and will monitor for improvement with conservative management. Low threshold to begin prednisone if his headache doesn't respond to conservative management.  -f/u flu PCR  -f/u CBC  -ordered fluticasone, loratadine, and pseudoephedrine    Protein C deficiency (HCC)   DVT (deep venous thrombosis) (HCC)   History of pulmonary embolus (PE) Last lower extremity ultrasound 01/2016 showed persistent left femoral vein DVT.  -continue Elliquis     Tobacco use   Cocaine dependence, continuous (HCC) - social work consult   DVT Ppx Eliquis  Code Status FULL   Dispo: Admit patient to Observation   Signed:  , MD 04/03/2016, 7:42 PM  Pager: [phone] 

## 2016-04-03 NOTE — ED Notes (Signed)
Pt given an apple

## 2016-04-03 NOTE — ED Notes (Signed)
Admitting notified that floor will not accept patient with active chest pain.  To place new orders.

## 2016-04-03 NOTE — ED Triage Notes (Signed)
Pt. Stated, I've had jaw pain from one side to the other for 2 days with chest pain and a headache.

## 2016-04-03 NOTE — ED Notes (Signed)
Dinner tray ordered; heart healthy diet 

## 2016-04-03 NOTE — ED Notes (Signed)
Attempted report.  Nurse to call back when available. 

## 2016-04-03 NOTE — ED Notes (Signed)
Still trying to give report.  Floor nurses tied up in a code.  Will call back when available.

## 2016-04-04 ENCOUNTER — Observation Stay (HOSPITAL_BASED_OUTPATIENT_CLINIC_OR_DEPARTMENT_OTHER): Payer: Self-pay

## 2016-04-04 DIAGNOSIS — Z720 Tobacco use: Secondary | ICD-10-CM

## 2016-04-04 DIAGNOSIS — Z86711 Personal history of pulmonary embolism: Secondary | ICD-10-CM

## 2016-04-04 DIAGNOSIS — R079 Chest pain, unspecified: Secondary | ICD-10-CM

## 2016-04-04 LAB — NM MYOCAR MULTI W/SPECT W/WALL MOTION / EF
CHL CUP MPHR: 168 {beats}/min
CHL CUP RESTING HR STRESS: 72 {beats}/min
CSEPEDS: 20 s
Estimated workload: 12.3 METS
Exercise duration (min): 10 min
Peak HR: 151 {beats}/min
Percent HR: 89 %

## 2016-04-04 LAB — COMPREHENSIVE METABOLIC PANEL
ALBUMIN: 3 g/dL — AB (ref 3.5–5.0)
ALK PHOS: 39 U/L (ref 38–126)
ALT: 13 U/L — AB (ref 17–63)
AST: 13 U/L — ABNORMAL LOW (ref 15–41)
Anion gap: 5 (ref 5–15)
BILIRUBIN TOTAL: 0.4 mg/dL (ref 0.3–1.2)
BUN: 13 mg/dL (ref 6–20)
CALCIUM: 8.7 mg/dL — AB (ref 8.9–10.3)
CO2: 27 mmol/L (ref 22–32)
CREATININE: 1.07 mg/dL (ref 0.61–1.24)
Chloride: 105 mmol/L (ref 101–111)
GFR calc Af Amer: >60 mL/min (ref >60)
GFR calc non Af Amer: >60 mL/min (ref >60)
Glucose, Bld: 93 mg/dL (ref 65–99)
Potassium: 4.1 mmol/L (ref 3.5–5.1)
SODIUM: 137 mmol/L (ref 135–145)
Total Protein: 6.2 g/dL — ABNORMAL LOW (ref 6.5–8.1)

## 2016-04-04 LAB — TROPONIN I
Troponin I: <0.03 ng/mL (ref <0.03)
Troponin I: <0.03 ng/mL (ref <0.03)

## 2016-04-04 LAB — MRSA PCR SCREENING: MRSA by PCR: NEGATIVE

## 2016-04-04 LAB — CBC
HEMATOCRIT: 34.6 % — AB (ref 39.0–52.0)
HEMOGLOBIN: 11.1 g/dL — AB (ref 13.0–17.0)
MCH: 25.6 pg — AB (ref 26.0–34.0)
MCHC: 32.1 g/dL (ref 30.0–36.0)
MCV: 79.9 fL (ref 78.0–100.0)
Platelets: 214 10*3/uL (ref 150–400)
RBC: 4.33 MIL/uL (ref 4.22–5.81)
RDW: 14.7 % (ref 11.5–15.5)
WBC: 6.5 10*3/uL (ref 4.0–10.5)

## 2016-04-04 MED ORDER — KETOROLAC TROMETHAMINE 10 MG PO TABS
20.0000 mg | ORAL_TABLET | Freq: Once | ORAL | Status: AC
  Administered 2016-04-04: 20 mg via ORAL
  Filled 2016-04-04: qty 2

## 2016-04-04 MED ORDER — NITROGLYCERIN 0.4 MG SL SUBL
SUBLINGUAL_TABLET | SUBLINGUAL | Status: AC
  Administered 2016-04-04: 0.4 mg via SUBLINGUAL
  Filled 2016-04-04: qty 1

## 2016-04-04 MED ORDER — NITROGLYCERIN 0.4 MG SL SUBL
0.4000 mg | SUBLINGUAL_TABLET | SUBLINGUAL | Status: DC | PRN
  Administered 2016-04-04: 0.4 mg via SUBLINGUAL

## 2016-04-04 MED ORDER — NITROGLYCERIN 0.4 MG SL SUBL
SUBLINGUAL_TABLET | SUBLINGUAL | Status: AC
  Filled 2016-04-04: qty 1

## 2016-04-04 MED ORDER — TECHNETIUM TC 99M TETROFOSMIN IV KIT
10.0000 | PACK | Freq: Once | INTRAVENOUS | Status: AC | PRN
  Administered 2016-04-04: 10 via INTRAVENOUS

## 2016-04-04 MED ORDER — TECHNETIUM TC 99M TETROFOSMIN IV KIT
30.0000 | PACK | Freq: Once | INTRAVENOUS | Status: AC | PRN
  Administered 2016-04-04: 30 via INTRAVENOUS

## 2016-04-04 MED ORDER — KETOROLAC TROMETHAMINE 30 MG/ML IJ SOLN: 30.0000 mg | Freq: Once | INTRAMUSCULAR | Status: DC

## 2016-04-04 NOTE — Progress Notes (Signed)
Patient states he is still having jaw pain.  He says "it was excruciating while down for my test and the tylenol didn't help." Patient states the tramadol he received yesterday helped the pain. Dr. Obie DredgeBlum made aware and received new orders.  Will continue to monitor.

## 2016-04-04 NOTE — Progress Notes (Signed)
    IMPRESSION: 1. Fixed defect involves the inferior wall. No abnormal reversibility identified.  2. Hypokinesis involves the inferior septum, inferior wall and inferolateral wall.  3. Left ventricular ejection fraction 47%  4. Non invasive risk stratification*: Low  *2012 Appropriate Use Criteria for Coronary Revascularization Focused Update: J Am Coll Cardiol. 2012;59(9):857-881. http://content.dementiazones.comonlinejacc.org/article.aspx?articleid=1201161   Called and updated patient on test results. Informed primary team. Cardiology will sign off.  Laverda PageLindsay Caroljean Monsivais NP-C

## 2016-04-04 NOTE — Progress Notes (Signed)
  Subjective: Mr. Tyrome Donatelli states his chest pain has improved today, it was constant before today but now he is feeling sporadic sharp pain. He feels like his head pressure and pain have improved as well. His jaw pain is constant throughout the day and does not get worse with eating. He denies changes in vision.   Objective:  Vital signs in last 24 hours: Vitals:   04/04/16 1131 04/04/16 1134 04/04/16 1140 04/04/16 1225  BP: (!) 141/78  (!) 112/56 109/76  Pulse: (!) 144 78 73 76  Resp:    20  Temp:    98.6 F (37 C)  TempSrc:    Oral  SpO2:    98%  Weight:      Height:       Physical Exam  Constitutional: He appears well-developed and well-nourished. No distress.  Cardiovascular: Normal rate and regular rhythm.   No murmur heard. Pulmonary/Chest: Effort normal and breath sounds normal. No respiratory distress. He has no wheezes. He has no rales. He exhibits tenderness.  Abdominal: Soft. Bowel sounds are normal. He exhibits no distension. There is no tenderness.  Skin: He is not diaphoretic.     Assessment/Plan: Pt is a 51 y.o. yo male with a PMHx of factor C deficiency, DVTs, and PEs on eliquis, depression, polysubstance abuse who was admitted on 04/03/2016 with symptoms of chest pain and sinus congestion, which was determined to be secondary to sinusitis.   Principal Problem:   Chest pain at rest Active Problems:   Chest pain   Protein C deficiency ([place])   History of pulmonary embolus (PE)   Tobacco use   Cocaine dependence, continuous (HCC)   DVT (deep venous thrombosis) (HCC)   Sinus congestion  Chest pain at rest  Says the chest pain has become less constant overnight but still remains intermittently, was evaluated by cardiology this morning and taken for stress test. Troponin poc 0.00 x2, <0.03 x2 overnight. EKG showed T wave inversions in V5 and V6.  Could be related to PE given his in consistent access to anticoagulation medications.  -continue telemetry  monitoring -follow up stress test results -continue trial of GI medications    Sinus congestion  Headache  Symptoms seem to be responding to conservative management. This would be an atypical presentation for GCA but it is concerning and we will continue to monitor. ESR difficult to interpret in the setting of known DVT. Flu PCR negative.  -continue fluticasone, loratadine, and pseudoephedrine  -neuro checks q8h   Protein C deficiency (HCC)   DVT (deep venous thrombosis) (HCC)   History of pulmonary embolus (PE) Last lower extremity ultrasound 01/2016 showed persistent left femoral vein DVT. Could have a PE at this time related to his symptoms but  -continue Elliquis  -will see how his SpO2 is with ambulation   Dispo: Anticipated discharge in approximately 1-2 day(s).   Ledell Noss, MD 04/04/2016, 1:01 PM Pager: [phone]

## 2016-04-04 NOTE — Consult Note (Signed)
CARDIOLOGY CONSULT NOTE   Patient ID: Peter Washington MRN: [id number] DOB/AGE: Jun 07, 1965 51 y.o.  Admit date: 04/03/2016  Primary Physician   Pcp Not In System Primary Cardiologist   Dr. Verl Blalock (seen 02/2014) Reason for Consultation   Chest pain  Requesting Physician  Dr. Daryll Drown  HPI: Peter Washington is a 51 y.o. male with a history of factor C deficiency, PE, DVT and prior history of polysubstance abuse who presents to ER with multiple complaints.  He was on Xarelto up until 2 weeks ago that switched to Eliquis. He was off anticoagulation for 1-2 days at that time. He gets his medication from health Department. Around that time, he had developed a headache followed by facial/tooth pain. The patient had sharp pain and some occasional shortness of breath. He also complaining of facial pain radiating to his left arm with numbness. He had then had a symptoms concerning for sinus congestion. He states that his he has been compliant with his medication. Occasionally smokes cigarettes. Denies use of marijuana or cocaine abuse. He quit many months ago.  States that his sharp left-sided chest pain is constant. This has not been exacerbated by exertional activity. No exertional shortness of breath.  ESR 36. EKG shows sinus rhythm with nonspecific T-wave inversion in inferior lateral lead. This appears more pronounced/new compared to prior EKG. Point-of-care troponin negative x 2. Troponin I negative x 2. Electrolytes normal. Urine drug screen negative. Influenza negative. His x-ray normal.  Denies family history of cardiac disease.  Past Medical History:  Diagnosis Date  . DVT of lower extremity (deep venous thrombosis) ([place])   . PE (pulmonary embolism)      History reviewed. No pertinent surgical history.  Allergies  Allergen Reactions  . Lactose Intolerance (Gi) Other (See Comments)    Causes acne, diarrhea and constipation  . Pork-Derived Products Nausea And Vomiting    I have reviewed the  patient's current medications . apixaban  5 mg Oral BID  . famotidine  40 mg Oral Daily  . fluticasone  2 spray Each Nare Daily  . loratadine  10 mg Oral Daily  . pseudoephedrine  120 mg Oral BID    acetaminophen, gi cocktail, ondansetron (ZOFRAN) IV  Prior to Admission medications   Medication Sig Start Date End Date Taking? Authorizing Provider  apixaban (ELIQUIS) 5 MG TABS tablet Take 5 mg by mouth 2 (two) times daily.   Yes Historical Provider, MD  ibuprofen (ADVIL,MOTRIN) 200 MG tablet Take 200 mg by mouth every 6 (six) hours as needed (pain).   Yes Historical Provider, MD  acetaminophen (TYLENOL) 325 MG tablet Take 2 tablets (650 mg total) by mouth every 6 (six) hours as needed for mild pain (or Fever >/= 101). Patient not taking: Reported on 04/03/2016 02/07/16   Robbie Lis, MD  ARIPiprazole (ABILIFY) 5 MG tablet Take 1 tablet (5 mg total) by mouth daily. Patient not taking: Reported on 04/03/2016 01/20/16   Kerrie Buffalo, NP  citalopram (CELEXA) 20 MG tablet Take 1 tablet (20 mg total) by mouth at bedtime. Patient not taking: Reported on 04/03/2016 01/19/16   Kerrie Buffalo, NP  famotidine (PEPCID) 40 MG tablet Take 1 tablet (40 mg total) by mouth daily. Patient not taking: Reported on 04/03/2016 02/07/16   Robbie Lis, MD  folic acid (FOLVITE) 1 MG tablet Take 1 tablet (1 mg total) by mouth daily. Patient not taking: Reported on 04/03/2016 02/08/16   Robbie Lis, MD  HYDROcodone-acetaminophen [facility]) 5-325 MG tablet  Take 1 tablet by mouth every 8 (eight) hours as needed for moderate pain. Patient not taking: Reported on 04/03/2016 02/07/16   Robbie Lis, MD  oxybutynin (DITROPAN) 5 MG tablet Take 1 tablet (5 mg total) by mouth every 8 (eight) hours as needed for bladder spasms. Patient not taking: Reported on 04/03/2016 02/07/16   Robbie Lis, MD  Rivaroxaban 15 & 20 MG TBPK Take as directed on package: Start with one 65m tablet by mouth twice a day with food. On Day 22,  switch to one 250mtablet once a day with food. Patient not taking: Reported on 04/03/2016 02/07/16   AlRobbie LisMD  tamsulosin (FLOMAX) 0.4 MG CAPS capsule Take 1 capsule (0.4 mg total) by mouth daily after supper. Patient not taking: Reported on 04/03/2016 02/07/16   AlRobbie LisMD  traZODone (DESYREL) 100 MG tablet Take 1 tablet (100 mg total) by mouth at bedtime. Patient not taking: Reported on 04/03/2016 01/19/16   ShKerrie BuffaloNP     Social History   Social History  . Marital status: Single    Spouse name: N/A  . Number of children: N/A  . Years of education: N/A   Occupational History  . Not on file.   Social History Main Topics  . Smoking status: Light Tobacco Smoker    Packs/day: 0.25    Types: Cigarettes  . Smokeless tobacco: Never Used  . Alcohol use Yes     Comment: 3 beers on weekends  . Drug use: No  . Sexual activity: Not on file   Other Topics Concern  . Not on file   Social History Narrative  . No narrative on file    Family Status  Relation Status  . Mother Deceased  . Father Deceased  . Neg Hx    Family History  Problem Relation Age of Onset  . Diabetes Neg Hx   . Stroke Neg Hx   . Cancer Neg Hx   . Clotting disorder Neg Hx      ROS:  Full 14 point review of systems complete and found to be negative unless listed above.  Physical Exam: Blood pressure (!) 106/57, pulse 89, temperature 98.6 F (37 C), temperature source Oral, resp. rate 18, height 5' 7" (1.702 m), weight 151 lb 9.6 oz (68.8 kg), SpO2 98 %.  General: Well developed, well nourished, male in no acute distress Head: Eyes PERRLA, No xanthomas. Normocephalic and atraumatic, oropharynx without edema or exudate.  Lungs: Resp regular and unlabored, CTA. TTP at left upper chest.  Heart: RRR no s3, s4, or murmurs..   Neck: No carotid bruits. No lymphadenopathy.  No JVD. Abdomen: Bowel sounds present, abdomen soft and non-tender without masses or hernias noted. Msk:  No spine or  cva tenderness. No weakness, no joint deformities or effusions. Extremities: No clubbing, cyanosis or edema. DP/PT/Radials 2+ and equal bilaterally. Neuro: Alert and oriented X 3. No focal deficits noted. Psych:  Good affect, responds appropriately Skin: No rashes or lesions noted.  Labs:   Lab Results  Component Value Date   WBC 6.5 04/04/2016   HGB 11.1 (L) 04/04/2016   HCT 34.6 (L) 04/04/2016   MCV 79.9 04/04/2016   PLT 214 04/04/2016   No results for input(s): INR in the last 72 hours.  Recent Labs Lab 04/04/16 0600  NA 137  K 4.1  CL 105  CO2 27  BUN 13  CREATININE 1.07  CALCIUM 8.7*  PROT 6.2*  BILITOT  0.4  ALKPHOS 39  ALT 13*  AST 13*  GLUCOSE 93  ALBUMIN 3.0*   Magnesium  Date Value Ref Range Status  02/02/2016 2.2 1.7 - 2.4 mg/dL Final    Recent Labs  04/03/16 2353 04/04/16 0600  TROPONINI <0.03 <0.03    Recent Labs  04/03/16 1429 04/03/16 1724  TROPIPOC 0.00 0.00   Lab Results  Component Value Date   CHOL 178 01/13/2016   HDL 95 01/13/2016   LDLCALC 68 01/13/2016   TRIG 77 01/13/2016   Lipase  Date/Time Value Ref Range Status  01/10/2016 01:11 AM 21 11 - 51 U/L Final   TSH  Date/Time Value Ref Range Status  02/02/2016 12:47 AM 1.347 0.350 - 4.500 uIU/mL Final    Comment:    Performed by a 3rd Generation assay with a functional sensitivity of <=0.01 uIU/mL.   Vitamin B-12  Date/Time Value Ref Range Status  01/13/2016 06:25 AM 233 180 - 914 pg/mL Final    Comment:    (NOTE) This assay is not validated for testing neonatal or myeloproliferative syndrome specimens for Vitamin B12 levels. Performed at [facility]    Ferritin  Date/Time Value Ref Range Status  01/17/2014 12:13 PM 150 22 - 322 ng/mL Final    Comment:    Performed at [facility]   TIBC  Date/Time Value Ref Range Status  01/17/2014 12:13 PM 272 215 - 435 ug/dL Final   Iron  Date/Time Value Ref Range Status  01/17/2014 12:13 PM 60 42 - 135  ug/dL Final    Radiology:  Dg Chest 2 View  Result Date: 04/03/2016 CLINICAL DATA:  Two day history of jaw pain and chest pain. History of pulmonary embolism. EXAM: CHEST  2 VIEW COMPARISON:  02/06/2016 FINDINGS: Heart size is normal. Mediastinal shadows are normal. The lungs are clear. No bronchial thickening. No infiltrate, mass, effusion or collapse. Pulmonary vascularity is normal. Ordinary mild spinal degenerative changes. IMPRESSION: Normal chest Electronically Signed   By: Nelson Chimes M.D.   On: 04/03/2016 15:27    ASSESSMENT AND PLAN:     1. Chest pain  - Atypical. Reproducible with palpation. States this he has a left sided sharp chest pain that has been constant which is not exacerbated by exertion. EKG with nonspecific changes. Troponin negative.  HEART score 1 correlates with low risk for ACS. Will get Exercise Myoivew give EKG changes. He states that he can walk on treadmill.   2. Sinus congestion/headache - Per primary team.  3. Protein C deficiency/ History of pulmonary embolus/DVT (deep venous thrombosis) ([place]) - He was on Xarelto up until 2 weeks ago that switched to Eliquis. He was off anticoagulation for 1-2 days at that time. He gets his medication from health Department.  4. Tobacco abuse - Occasional use  5. Polysubstance abuse - UDS negative.      SignedLeanor Kail, [place] 04/04/2016, 8:05 AM Pager [phone]  Co-Sign MD  I have seen, examined the patient, and reviewed the above assessment and plan.  On exam, moderate tenderness of maxillary sinuses.  Some tenderness over chest wall.  Changes to above are made where necessary.  There are subtle but new lateral twi when compared to prior ekgs.  Though his CP history is somewhat atypical, I think that additional CV testing is warranted.  Will plan exercise myoview tomorrow am.  If low risk, no further CV testing is planned.  If moderate or high risk, additional risk stratification may be required.  Co  Sign: Thompson Grayer, MD 04/04/2016 9:49 AM

## 2016-04-04 NOTE — Progress Notes (Signed)
   Melton KrebsAlvin Meenach presented for a exercise cardiolite today.  Stress imaging is pending at this time.  Did c/o chest pressure after stopping exercise and laying back on bed. Given 1 SL nitro and symptoms subsided.   Laverda PageLindsay Roberts, NP 04/04/2016, 11:34 AM

## 2016-04-04 NOTE — Progress Notes (Signed)
Patient oxygen saturation 96-98% while resting and ambulating on room air.

## 2016-04-05 ENCOUNTER — Telehealth: Payer: Self-pay

## 2016-04-05 DIAGNOSIS — R6884 Jaw pain: Secondary | ICD-10-CM

## 2016-04-05 MED ORDER — ACETAMINOPHEN 325 MG PO TABS: 650.0000 mg | ORAL_TABLET | Freq: Four times a day (QID) | ORAL | 0 refills | Status: DC | PRN

## 2016-04-05 MED ORDER — PSEUDOEPHEDRINE HCL ER 120 MG PO TB12: 120.0000 mg | ORAL_TABLET | Freq: Two times a day (BID) | ORAL | 0 refills | Status: DC

## 2016-04-05 MED ORDER — FLUTICASONE PROPIONATE 50 MCG/ACT NA SUSP
2.0000 | Freq: Every day | NASAL | 0 refills | Status: DC
Start: 2016-04-06 — End: 2016-12-25

## 2016-04-05 MED ORDER — LORATADINE 10 MG PO TABS: 10.0000 mg | ORAL_TABLET | Freq: Every day | ORAL | 0 refills | Status: DC

## 2016-04-05 MED ORDER — AMOXICILLIN-POT CLAVULANATE 875-125 MG PO TABS: 1.0000 | ORAL_TABLET | Freq: Two times a day (BID) | ORAL | 0 refills | Status: DC

## 2016-04-05 MED ORDER — APIXABAN 5 MG PO TABS: 5.0000 mg | ORAL_TABLET | Freq: Two times a day (BID) | ORAL | 0 refills | Status: DC

## 2016-04-05 MED ORDER — APIXABAN 5 MG PO TABS
5.0000 mg | ORAL_TABLET | Freq: Two times a day (BID) | ORAL | 0 refills | Status: DC
Start: 2016-04-05 — End: 2016-06-10

## 2016-04-05 MED ORDER — FLUTICASONE PROPIONATE 50 MCG/ACT NA SUSP: 2.0000 | Freq: Every day | NASAL | 0 refills | Status: DC

## 2016-04-05 MED ORDER — SALINE SPRAY 0.65 % NA SOLN
1.0000 | NASAL | Status: DC | PRN
  Filled 2016-04-05 (×2): qty 44

## 2016-04-05 MED ORDER — LORATADINE 10 MG PO TABS
10.0000 mg | ORAL_TABLET | Freq: Every day | ORAL | Status: DC
  Administered 2016-04-05: 10 mg via ORAL
  Filled 2016-04-05: qty 1

## 2016-04-05 MED FILL — FLUTICASONE PROP 50 MCG SPR: 50 | 30 days supply | Qty: 16 | Fill #0

## 2016-04-05 MED FILL — AMOX-CLAV 875-125 MG TABLET: 875-125 | 5 days supply | Qty: 10 | Fill #0

## 2016-04-05 MED FILL — ELIQUIS 5 MG TABLET: 5 | 30 days supply | Qty: 60 | Fill #0

## 2016-04-05 NOTE — Discharge Summary (Signed)
Name: Peter Washington MRN: [id number] DOB: [birth year] 51 y.o. PCP: Pcp Not In System  Date of Admission: 04/03/2016  2:44 PM Date of Discharge: 04/05/2016 Attending Physician: Peter Groves, DO  Discharge Diagnosis: 1.   Chest pain at rest   Sinus congestion Jaw pain   Discharge Medications: Allergies as of 04/05/2016      Reactions   Lactose Intolerance (gi) Other (See Comments)   Causes acne, diarrhea and constipation   Pork-derived Products Nausea And Vomiting      Medication List    STOP taking these medications   HYDROcodone-acetaminophen 5-325 MG tablet Commonly known as:  NORCO   ibuprofen 200 MG tablet Commonly known as:  ADVIL,MOTRIN   oxybutynin 5 MG tablet Commonly known as:  DITROPAN   Rivaroxaban 15 & 20 MG Tbpk   tamsulosin 0.4 MG Caps capsule Commonly known as:  FLOMAX   traZODone 100 MG tablet Commonly known as:  DESYREL     TAKE these medications   acetaminophen 325 MG tablet Commonly known as:  TYLENOL Take 2 tablets (650 mg total) by mouth every 6 (six) hours as needed for mild pain (or Fever >/= 101).   amoxicillin-clavulanate 875-125 MG tablet Commonly known as:  AUGMENTIN Take 1 tablet by mouth 2 (two) times daily. Stop date 1/27   apixaban 5 MG Tabs tablet Commonly known as:  ELIQUIS Take 1 tablet (5 mg total) by mouth 2 (two) times daily.   ARIPiprazole 5 MG tablet Commonly known as:  ABILIFY Take 1 tablet (5 mg total) by mouth daily.   citalopram 20 MG tablet Commonly known as:  CELEXA Take 1 tablet (20 mg total) by mouth at bedtime.   famotidine 40 MG tablet Commonly known as:  PEPCID Take 1 tablet (40 mg total) by mouth daily.   fluticasone 50 MCG/ACT nasal spray Commonly known as:  FLONASE Place 2 sprays into both nostrils daily. Start taking on:  8/00/3491   folic acid 1 MG tablet Commonly known as:  FOLVITE Take 1 tablet (1 mg total) by mouth daily.   loratadine 10 MG tablet Commonly known as:  CLARITIN Take 1  tablet (10 mg total) by mouth daily. Start taking on:  04/06/2016   pseudoephedrine 120 MG 12 hr tablet Commonly known as:  SUDAFED Take 1 tablet (120 mg total) by mouth 2 (two) times daily.       Disposition and follow-up:   [name] was discharged from [facility] in Stable condition.  At the hospital follow up visit please address:  1.  Jaw pain - Has this continued to improve with treatment for sinus congestion and sinusitis?   2.  Labs / imaging needed at time of follow-up: none   3.  Pending labs/ test needing follow-up: none   Follow-up Appointments: Follow-up Information    [place]. Go on 04/09/2016.   Why:  You have an appointment scheduled at 10:45 Contact information: 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 Chest pain had started 2 weeks prior to presentation, it was left sided and associated with chest wall tenderness and worse with deep inspiration and left arm numbness. Jaw pain  began 1.5 weeks ago, started over his right jaw then moved to involve his right jaw as well was worse at night and not worsened with chewing. Jaw pain was associated with temporal headache and sinus pressure.Has a hx of multiple PE and DVTs and describes a 2 week period of time where he did not have access to anticoagulation medication before receiving a 2 week rx for eliquis from the health department.  In the ED he had stable vitals, physical exam revealed bilateral eye puffiness with tenderness to palpation over frontal and maxillary sinuses, good dentition with no obvious dental caries. ESR 36, poc trop 0.0 > 0.0 > trop I <0.03 x2, influenza negative. Was started on  flonase, loratadine, sudafed, tylenol, pepcid, and eliquis. Cardiology evaluated him and ordered an exercise stress test which was low risk. His chest pain had resolved completely the day of discharge. Jaw pain came and went throughout his hospitalization, seemed to respond to claritin, sudafed, and toradol. Giant cell arteritis remained on our differential but his history was atypical and his response to conservative management somewhat reassuring. Denied changes in vision. Chest pain may have been related to PE in the setting of inconsistent medication compliance vs gastritis. At discharge was provided rx for augmentin 5 days, tylenol, loratadine, flonase, pepcid, and eliquis to the [facility] through [place]. Scheduled for follow up in 4 days at internal medicine clinic.     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 Last lower extremity ultrasound 01/2016 showed persistent left femoral vein DVT. Home medication eliquis was continued. Was provided with 30 day Rx for eliquis through [facility].     Tobacco use   Cocaine dependence, continuous ([place]) Uses cocaine, says he last used 90 days ago.  Discharge Vitals:   BP 112/68 (BP Location: Right Arm)   Pulse 62   Temp 98.1 F (36.7 C) (Oral)   Resp 18   Ht _0  (1.702 m)   Wt 152 lb 11.2 oz (69.3 kg)   SpO2 100%   BMI 23.92 kg/m   Pertinent Labs, Studies, and Procedures:  Procedures Performed:  Dg Chest 2 View  Result Date: 04/03/2016 CLINICAL DATA:  Two day history of jaw pain and chest pain. History of pulmonary embolism. EXAM: CHEST  2 VIEW COMPARISON:  02/06/2016 FINDINGS: Heart size is normal. Mediastinal shadows are normal. The lungs are clear. No  bronchial thickening. No infiltrate, mass, effusion or collapse. Pulmonary vascularity is normal. Ordinary mild spinal degenerative changes. IMPRESSION: Normal chest Electronically Signed   By: Nelson Chimes M.D.   On: 04/03/2016 15:27   Nm Myocar Multi W/spect W/wall Motion / Ef  Result Date: 04/04/2016 CLINICAL DATA:  Chest pain.  Smoker. EXAM: MYOCARDIAL IMAGING WITH SPECT (REST AND EXERCISE) GATED LEFT VENTRICULAR WALL MOTION STUDY LEFT VENTRICULAR EJECTION FRACTION TECHNIQUE: Standard myocardial SPECT imaging was performed after resting intravenous injection of 10 mCi Tc-77mtetrofosmin. Subsequently, exercise tolerance test was performed by the patient under the supervision of the Cardiology staff. At peak-stress, 30 mCi Tc-94metrofosmin was injected intravenously and standard myocardial SPECT imaging was performed. Quantitative gated imaging was also performed to evaluate left ventricular wall motion, and estimate left ventricular ejection fraction. COMPARISON:  None. FINDINGS: Perfusion: There is a fixed defect involving the mid and basilar portions of the inferior wall. Wall Motion: There is decreased wall motion involving the inferior septum, inferior wall and mid inferolateral wall. Left Ventricular Ejection Fraction: 47 % End diastolic volume 95 ml End systolic volume 50 ml IMPRESSION: 1. Fixed defect involves the inferior wall. No abnormal reversibility identified. 2. Hypokinesis involves the inferior septum, inferior wall and inferolateral wall. 3. Left ventricular ejection fraction 47% 4. Non invasive risk stratification*: Low *2012 Appropriate Use Criteria for Coronary Revascularization Focused Update: J Am Coll Cardiol. 9166;06(0):045-997. http://content.airportbarriers.com.aspx?articleid=[email or web address] Electronically Signed   By: Kerby Moors M.D.   On: 04/04/2016 13:57    Discharge Instructions: Discharge Instructions    Call MD for:    Complete by:  As directed    Changes in vision    Call MD for:  severe uncontrolled pain    Complete by:  As directed    Call MD for:  temperature >100.4    Complete by:  As directed    Diet - low sodium heart healthy    Complete by:  As directed    Increase activity slowly    Complete by:  As directed       Signed: Ledell Noss, MD 04/05/2016, 3:15 PM   Pager: [phone]

## 2016-04-05 NOTE — Telephone Encounter (Signed)
Hospital TOC. 

## 2016-04-05 NOTE — Progress Notes (Signed)
  Subjective: [name] states his chest pain has resolved today. His jaw pain has been responsive to Claritin, sudafed, and toradol and becomes worse just as he is due for a next dose.   Objective:  Vital signs in last 24 hours: Vitals:   04/04/16 1702 04/04/16 1951 04/04/16 2300 04/05/16 0700  BP: (!) 88/77 119/68  103/63  Pulse: 68  77 60  Resp: 18   20  Temp: 98.2 F (36.8 C)  98.9 F (37.2 C) 97.9 F (36.6 C)  TempSrc: Oral  Oral Oral  SpO2: 98%  100% 100%  Weight:    152 lb 11.2 oz (69.3 kg)  Height:       Physical Exam  Constitutional: He appears well-developed and well-nourished. No distress.  HENT:  Head: Normocephalic and atraumatic.  Mouth/Throat: Oropharynx is clear and moist.  Good dentition, no obvious caries   Cardiovascular: Normal rate and regular rhythm.   No murmur heard. Pulmonary/Chest: Effort normal and breath sounds normal. No respiratory distress. He has no wheezes. He has no rales.  Abdominal: Bowel sounds are normal. He exhibits no distension.  Skin: He is not diaphoretic.     Assessment/Plan: Pt is a 51 y.o. yo male with a PMHx of factor C deficiency, DVTs, and PEs on eliquis, depression, polysubstance abuse who was admitted on 04/03/2016 with symptoms of chest pain and sinus congestion, which was determined to be secondary to sinusitis.   Principal Problem:   Chest pain at rest Active Problems:   Chest pain   Protein C deficiency (HCC)   History of pulmonary embolus (PE)   Tobacco use   Cocaine dependence, continuous (HCC)   DVT (deep venous thrombosis) (HCC)   Sinus congestion  Chest pain at rest  Stress test yesterday was low risk. Chest pain has resolved today. Could have been related to PE given his in consistent access to anticoagulation medications.  -continue famotidine qd  Sinus congestion  Headache  Symptoms have been responding to conservative management. This would be an atypical presentation for [facility] but it is concerning  and we will continue to monitor. ESR difficult to interpret in the setting of known DVT. Flu PCR. -continue fluticasone, loratadine, and pseudoephedrine  -ordered ocean nasal spray  -neuro checks q8h   Protein C deficiency (HCC)   DVT (deep venous thrombosis) (HCC)   History of pulmonary embolus (PE) Last lower extremity ultrasound 01/2016 showed persistent left femoral vein DVT. Could have a PE at this time related to his symptoms. SpO2 98% with ambulation on room air.  -continue Elliquis   Dispo: Anticipated discharge in approximately 1-2 day(s).   Ledell Noss, MD 04/05/2016, 9:08 AM Pager: [phone]

## 2016-04-05 NOTE — Progress Notes (Signed)
Patient with increasing pain in the jaw rated a 6/10. Pt stated Tylenol ineffective and was told by MD "to take claritin instead of pain meds when jaw pain arises". Pt already received daily dose of claritin. Pt stated that if he couldn't take claritin than he needed something else for pain. Teaching services paged and new orders received. Will need follow up from MD about discussion about Claritin dosage and pain control. Messaged relayed to teaching services. Will continue to monitor.

## 2016-04-05 NOTE — Progress Notes (Signed)
Patient discharged to home with belongings, IVs and tele removed. Teach back performed. AVS given, all questions answered. Patient stable at time of discharge.

## 2016-04-08 ENCOUNTER — Telehealth: Payer: Self-pay | Admitting: *Deleted

## 2016-04-08 NOTE — Telephone Encounter (Signed)
APT. REMINDER CALL, LMTCB °

## 2016-04-09 ENCOUNTER — Ambulatory Visit (INDEPENDENT_AMBULATORY_CARE_PROVIDER_SITE_OTHER): Payer: Self-pay | Admitting: Internal Medicine

## 2016-04-09 ENCOUNTER — Encounter: Payer: Self-pay | Admitting: Internal Medicine

## 2016-04-09 DIAGNOSIS — R6884 Jaw pain: Secondary | ICD-10-CM

## 2016-04-09 DIAGNOSIS — Z87891 Personal history of nicotine dependence: Secondary | ICD-10-CM

## 2016-04-09 DIAGNOSIS — Z86718 Personal history of other venous thrombosis and embolism: Secondary | ICD-10-CM

## 2016-04-09 DIAGNOSIS — Z86711 Personal history of pulmonary embolism: Secondary | ICD-10-CM

## 2016-04-09 DIAGNOSIS — Z5189 Encounter for other specified aftercare: Secondary | ICD-10-CM

## 2016-04-09 MED ORDER — NAPROXEN 500 MG PO TABS
500.0000 mg | ORAL_TABLET | Freq: Two times a day (BID) | ORAL | 0 refills | Status: DC
Start: 2016-04-09 — End: 2016-06-10

## 2016-04-09 NOTE — Patient Instructions (Addendum)
Peter Washington,  I would like to try an antiinflammatory medication called Naproxen to see if it can help with your pain.   Please follow up in 1 week.   Temporomandibular Joint Syndrome Temporomandibular joint (TMJ) syndrome is a condition that affects the joints between your jaw and your skull. The TMJs are located near your ears and allow your jaw to open and close. These joints and the nearby muscles are involved in all movements of the jaw. People with TMJ syndrome have pain in the area of these joints and muscles. Chewing, biting, or other movements of the jaw can be difficult or painful. TMJ syndrome can be caused by various things. In many cases, the condition is mild and goes away within a few weeks. For some people, the condition can become a long-term problem. What are the causes? Possible causes of TMJ syndrome include:  Grinding your teeth or clenching your jaw. Some people do this when they are under stress.  Arthritis.  Injury to the jaw.  Head or neck injury.  Teeth or dentures that are not aligned well. In some cases, the cause of TMJ syndrome may not be known. What are the signs or symptoms? The most common symptom is an aching pain on the side of the head in the area of the TMJ. Other symptoms may include:  Pain when moving your jaw, such as when chewing or biting.  Being unable to open your jaw all the way.  Making a clicking sound when you open your mouth.  Headache.  Earache.  Neck or shoulder pain. How is this diagnosed? Diagnosis can usually be made based on your symptoms, your medical history, and a physical exam. Your health care provider may check the range of motion of your jaw. Imaging tests, such as X-rays or an MRI, are sometimes done. You may need to see your dentist to determine if your teeth and jaw are lined up correctly. How is this treated? TMJ syndrome often goes away on its own. If treatment is needed, the options may include:  Eating soft  foods and applying ice or heat.  Medicines to relieve pain or inflammation.  Medicines to relax the muscles.  A splint, bite plate, or mouthpiece to prevent teeth grinding or jaw clenching.  Relaxation techniques or counseling to help reduce stress.  Transcutaneous electrical nerve stimulation (TENS). This helps to relieve pain by applying an electrical current through the skin.  Acupuncture. This is sometimes helpful to relieve pain.  Jaw surgery. This is rarely needed. Follow these instructions at home:  Take medicines only as directed by your health care provider.  Eat a soft diet if you are having trouble chewing.  Apply ice to the painful area.  Put ice in a plastic bag.  Place a towel between your skin and the bag.  Leave the ice on for 20 minutes, 2-3 times a day.  Apply a warm compress to the painful area as directed.  Massage your jaw area and perform any jaw stretching exercises as recommended by your health care provider.  If you were given a mouthpiece or bite plate, wear it as directed.  Avoid foods that require a lot of chewing. Do not chew gum.  Keep all follow-up visits as directed by your health care provider. This is important. Contact a health care provider if:  You are having trouble eating.  You have new or worsening symptoms. Get help right away if:  Your jaw locks open or closed. This  information is not intended to replace advice given to you by your health care provider. Make sure you discuss any questions you have with your health care provider. Document Released: 11/24/2000 Document Revised: 10/30/2015 Document Reviewed: 10/04/2013 Elsevier Interactive Patient Education  2017 [employer]

## 2016-04-09 NOTE — Telephone Encounter (Signed)
Transition Care Management Follow-up Telephone Call Date discharged?04/05/16.   How have you been since you were released from the hospital?   Do you understand why you were in the hospital?    Do you understand the discharge instructions?    Where were you discharged to?   Items Reviewed:  Medications reviewed:   Allergies reviewed:   Dietary changes reviewe   Referrals reviewed:    Functional Questionnaire:   Activities of Daily Living (ADLs):   He states they are independent in the following:  States they require assistance with the following:    Any transportation issues/concerns?:   Any patient concerns?    Confirmed importance and date/time of follow-up visits scheduled   Provider Appointment booked with Dr Karma GreaserBoswell who he saw this morning; his appt scheduled for today @ 1045 AM.  Confirmed with patient if condition begins to worsen call PCP or go to the ER.  Patient was given the office number and encouraged to call back with question or concerns.  :

## 2016-04-09 NOTE — Telephone Encounter (Signed)
Pt's f/u appt was today with Dr Karma GreaserBoswell.

## 2016-04-09 NOTE — Progress Notes (Signed)
   CC: Jaw Pain  HPI:  [name] is a 51 y.o. male with a past medical history listed below here today for follow up of his recent hospitalization for jaw pain.   For details of today's visit and the status of his chronic medical issues please refer to the assessment and plan.   Past Medical History:  Diagnosis Date  . DVT of lower extremity (deep venous thrombosis) (HCC)   . PE (pulmonary embolism)     Review of Systems:   See HPI  Physical Exam:  Vitals:   04/09/16 1111  BP: 110/64  Pulse: 74  Temp: 97.6 F (36.4 C)  TempSrc: Oral  SpO2: 100%  Weight: 162 lb 9.6 oz (73.8 kg)  Height: 5\' 7"  (1.702 m)   Physical Exam  Constitutional: He is oriented to person, place, and time and well-developed, well-nourished, and in no distress. No distress.  HENT:  Head: Normocephalic and atraumatic.  Right Ear: External ear normal.  Left Ear: External ear normal.  Pain over bilateral TMJ with opening/closing of jaw  Eyes: Pupils are equal, round, and reactive to light.  Cardiovascular: Normal rate, regular rhythm and normal heart sounds.   Pulmonary/Chest: Effort normal and breath sounds normal.  Neurological: He is alert and oriented to person, place, and time. He displays no weakness. No cranial nerve deficit.  Skin: Skin is warm and dry.   Assessment & Plan:   See Encounters Tab for problem based charting.  Patient discussed with Dr. Heide SparkNarendra

## 2016-04-11 DIAGNOSIS — R6884 Jaw pain: Secondary | ICD-10-CM | POA: Insufficient documentation

## 2016-04-11 NOTE — Assessment & Plan Note (Signed)
Recent admission 1/20-1/22 for chest pain, jaw pain and sinus congestion. History of multiple PE and DVTs and unable to obtain anticoagulation. Had negative EKG, troponins and low risk stress test. Jaw pain responded to conservative treatment with claritin, sudafel and toradol during hospitalization. His ESR was checked and was 36.  He reports the jaw pain began as left sided and then migrated to the right and is now bilateral. Reports a shooting stabbing pain from his temporal region down his jaw radiating to midline. Pain occurs with eating. Denies any visual changes but difficult to tell because he has chronic eye issues from accident as a child. No muscle weakness. No fevers. No headaches.   Reports his sinus congestion has resolved and his jaw pain persists.   Assessment: Jaw pain with mastication  Plan: Symptoms appear more likley to be TMJ pain as he has pain over TMJ bilaterally on exam. However the temporal nature of his pain does raise concerns for giant cell arteritis. His ESR during hospitalization was 36 and only 5% of cases present with an ESR <40. He has no other features with no fevers, headaches, proximal weakness, or vision changes (though difficult to ascertain given history of chronic vision problems).   Will try conservative treatment with Naproxen 500 mg bid and RTC in 1 week. If alarm symptoms appear or no improvement can consider initiating work up for giant cell arteritis and trial of steroids.

## 2016-04-12 NOTE — Progress Notes (Signed)
Internal Medicine Clinic Attending  Case discussed with Dr. Boswell at the time of the visit.  We reviewed the resident's history and exam and pertinent patient test results.  I agree with the assessment, diagnosis, and plan of care documented in the resident's note.  

## 2016-04-30 NOTE — Congregational Nurse Program (Signed)
Congregational Nurse Program Note  Date of Encounter: 04/19/2016  Past Medical History: Past Medical History:  Diagnosis Date  . DVT of lower extremity (deep venous thrombosis) (HCC)   . PE (pulmonary embolism)     Encounter Details:     CNP Questionnaire - 04/19/16 1029      Patient Demographics   Is this a new or existing patient? New   Patient is considered a/an Not Applicable   Race African-American/Black     Patient Assistance   Location of Patient Assistance Not Applicable   Patient's financial/insurance status Low Income;Self-Pay (Uninsured)   Uninsured Patient ([employer] officer, trade union) Yes   Patient referred to apply for the following financial assistance [employer] insecurities addressed Not Technical brewer No   Assistance securing medications No   Educational health offerings Behavioral health;Chronic disease;Navigating the healthcare system     Encounter Details   Primary purpose of visit Education/Health Concerns;Chronic Illness/Condition Visit;Post ED/Hospitalization Visit   Was an Emergency Department visit averted? Not Applicable   Does patient have a medical provider? Yes   Patient referred to Clinic   Was a mental health screening completed? (GAINS tool) No   Does patient have dental issues? No   Does patient have vision issues? No   Does your patient have an abnormal blood pressure today? No   Since previous encounter, have you referred patient for abnormal blood pressure that resulted in a new diagnosis or medication change? No   Does your patient have an abnormal blood glucose today? No   Since previous encounter, have you referred patient for abnormal blood glucose that resulted in a new diagnosis or medication change? No   Was there a life-saving intervention made? No         Clinical Intake - 04/09/16 1127      Pain   Pain  0-10   Pain Score 8    Pain Type Acute pain   Pain Location Jaw   Pain  Orientation Right;Left   Pain Descriptors / Indicators Sharp  FOR ABOUT A WEEK   Pain Onset 1 to 4 weeks ago   Pain Frequency Intermittent   Effect of Pain on Daily Activities DISCOMFORT     Nutrition Screen   BMI - recorded 25.47   Nutritional Status BMI 25 -29 Overweight   Nutritional Risks None   Diabetes No     Functional Status   Activities of Daily Living Independent   Ambulation Independent   Medication Administration Independent   Home Management Independent     Risk/Barriers   Barriers to Care Management & Learning None     Abuse/Neglect   Do you feel unsafe in your current relationship? No   Do you feel physically threatened by others? No   Anyone hurting you at home, work, or school? No   Unable to ask? No     Patient Literacy   How often do you need to have someone help you when you read instructions, pamphlets, or other written materials from your doctor or pharmacy? 1 - Never   What is the last grade level you completed in school? 12TH GRADE     Web designer Needed? No     Comments   Information entered by : Theotis Barrio NTII 1-26-018  11:29AM     States was released from [facility] recently but has not been on his medications.  Requesting assistance with medications and counseling.  [facility] as he has [facility] there before.  Referred to congregational social work [employer] for counseling

## 2016-06-10 ENCOUNTER — Emergency Department (HOSPITAL_COMMUNITY)
Admission: EM | Admit: 2016-06-10 | Discharge: 2016-06-10 | Disposition: A | Discharge status: Home / Self Care | Payer: Self-pay | Attending: Emergency Medicine | Admitting: Emergency Medicine

## 2016-06-10 ENCOUNTER — Encounter (HOSPITAL_COMMUNITY): Payer: Self-pay

## 2016-06-10 ENCOUNTER — Emergency Department (HOSPITAL_COMMUNITY): Payer: Self-pay

## 2016-06-10 DIAGNOSIS — M7989 Other specified soft tissue disorders: Secondary | ICD-10-CM | POA: Insufficient documentation

## 2016-06-10 DIAGNOSIS — F17210 Nicotine dependence, cigarettes, uncomplicated: Secondary | ICD-10-CM | POA: Insufficient documentation

## 2016-06-10 DIAGNOSIS — D530 Protein deficiency anemia: Secondary | ICD-10-CM | POA: Insufficient documentation

## 2016-06-10 DIAGNOSIS — D6859 Other primary thrombophilia: Secondary | ICD-10-CM

## 2016-06-10 DIAGNOSIS — Z79899 Other long term (current) drug therapy: Secondary | ICD-10-CM | POA: Insufficient documentation

## 2016-06-10 DIAGNOSIS — R072 Precordial pain: Secondary | ICD-10-CM | POA: Insufficient documentation

## 2016-06-10 DIAGNOSIS — R0602 Shortness of breath: Secondary | ICD-10-CM

## 2016-06-10 DIAGNOSIS — Z9119 Patient's noncompliance with other medical treatment and regimen: Secondary | ICD-10-CM

## 2016-06-10 DIAGNOSIS — Z91199 Patient's noncompliance with other medical treatment and regimen due to unspecified reason: Secondary | ICD-10-CM

## 2016-06-10 LAB — BASIC METABOLIC PANEL
Anion gap: 9 (ref 5–15)
BUN: 17 mg/dL (ref 6–20)
CALCIUM: 9 mg/dL (ref 8.9–10.3)
CHLORIDE: 105 mmol/L (ref 101–111)
CO2: 24 mmol/L (ref 22–32)
CREATININE: 1.12 mg/dL (ref 0.61–1.24)
GFR calc Af Amer: >60 mL/min (ref >60)
GFR calc non Af Amer: >60 mL/min (ref >60)
Glucose, Bld: 85 mg/dL (ref 65–99)
Potassium: 4 mmol/L (ref 3.5–5.1)
SODIUM: 138 mmol/L (ref 135–145)

## 2016-06-10 LAB — CBC
HCT: 36.3 % — ABNORMAL LOW (ref 39.0–52.0)
Hemoglobin: 11.9 g/dL — ABNORMAL LOW (ref 13.0–17.0)
MCH: 25.4 pg — ABNORMAL LOW (ref 26.0–34.0)
MCHC: 32.8 g/dL (ref 30.0–36.0)
MCV: 77.4 fL — AB (ref 78.0–100.0)
PLATELETS: 212 10*3/uL (ref 150–400)
RBC: 4.69 MIL/uL (ref 4.22–5.81)
RDW: 14.9 % (ref 11.5–15.5)
WBC: 7.2 10*3/uL (ref 4.0–10.5)

## 2016-06-10 LAB — I-STAT TROPONIN, ED: Troponin i, poc: 0 ng/mL (ref 0.00–0.08)

## 2016-06-10 MED ORDER — APIXABAN 5 MG PO TABS: 5.0000 mg | ORAL_TABLET | Freq: Two times a day (BID) | ORAL | 0 refills | Status: DC

## 2016-06-10 MED ORDER — APIXABAN 5 MG PO TABS
5.0000 mg | ORAL_TABLET | Freq: Once | ORAL | Status: AC
  Administered 2016-06-10: 5 mg via ORAL
  Filled 2016-06-10: qty 1

## 2016-06-10 NOTE — Discharge Planning (Signed)
Peter Washington J. Peter RoersWood, RN, BSN, [place] [phone]  [facility] Lily KocherGomez, PA on 4/5 @0830 .  Spoke with pt at bedside and advised to please arrive 15 min early and take a picture ID and your current medications.  Pt verbalizes understanding of keeping appointment.

## 2016-06-10 NOTE — ED Notes (Signed)
Pt ambulated with steady gate. RA 99%

## 2016-06-10 NOTE — ED Notes (Signed)
Placed urinal at bedside

## 2016-06-10 NOTE — ED Notes (Signed)
Pt unable to urinate at this time.  

## 2016-06-10 NOTE — Discharge Planning (Signed)
[facility] met with pt at bedside regarding upcoming appointment.  Pt states that he is familiar with appointment location and he understands the importance of keeping the appointment.

## 2016-06-10 NOTE — ED Provider Notes (Signed)
[facility]-EMERGENCY DEPT Provider Note   CSN: [id number] Arrival date & time: 06/10/16  0716     History   Chief Complaint Chief Complaint  Patient presents with  . Shortness of Breath    HPI Peter Washington is a 51 y.o. male.  Peter Washington is a 51 y.o. Male with a history of protein C deficiency, with previous PE, and DVT who presents to the ED complaining of three days of chest pain and shortness of breath. He reports constant bilateral anterior chest pain for three days that is worse with deep inspiration. He also reports he has had some increased left leg swelling, without pain, for the past week. He reports he has not been taking his Eliquis for the past week and half due to its cost. He denies recent illness, fevers, abdominal pain, nausea, vomiting, hemoptysis, coughing, syncope, lightheadedness, dizziness or rashes.   The history is provided by the patient and medical records. No language interpreter was used.  Shortness of Breath  Associated symptoms include chest pain and leg swelling. Pertinent negatives include no fever, no headaches, no sore throat, no neck pain, no cough, no wheezing, no vomiting, no abdominal pain and no rash.    Past Medical History:  Diagnosis Date  . DVT of lower extremity (deep venous thrombosis) (HCC)   . PE (pulmonary embolism)     Patient Active Problem List   Diagnosis Date Noted  . Jaw pain 04/11/2016  . Sinus congestion 04/03/2016  . Depression 02/02/2016  . DVT (deep venous thrombosis) (HCC) 02/02/2016  . Cocaine dependence, continuous (HCC) 01/19/2016  . 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 History of pulmonary embolus (PE) 01/16/2014  . Tobacco use 01/16/2014  . Protein C deficiency (HCC) 12/31/2013    History reviewed. No pertinent surgical history.     Home Medications    Prior to Admission  medications   Medication Sig Start Date End Date Taking? Authorizing Provider  acetaminophen (TYLENOL) 325 MG tablet Take 2 tablets (650 mg total) by mouth every 6 (six) hours as needed for mild pain (or Fever >/= 101). Patient not taking: Reported on 06/10/2016 04/05/16   Eulah PontNina Blum, MD  amoxicillin-clavulanate (AUGMENTIN) 875-125 MG tablet Take 1 tablet by mouth 2 (two) times daily. Stop date 1/27 Patient not taking: Reported on 06/10/2016 04/05/16   Eulah PontNina Blum, MD  apixaban (ELIQUIS) 5 MG TABS tablet Take 1 tablet (5 mg total) by mouth 2 (two) times daily. 06/10/16   Everlene FarrierWilliam Sadonna Kotara, PA-C  ARIPiprazole (ABILIFY) 5 MG tablet Take 1 tablet (5 mg total) by mouth daily. Patient not taking: Reported on 04/03/2016 01/20/16   Adonis BrookSheila Agustin, NP  citalopram (CELEXA) 20 MG tablet Take 1 tablet (20 mg total) by mouth at bedtime. Patient not taking: Reported on 04/03/2016 01/19/16   Adonis BrookSheila Agustin, NP  famotidine (PEPCID) 40 MG tablet Take 1 tablet (40 mg total) by mouth daily. Patient not taking: Reported on 04/03/2016 02/07/16   Alison MurrayAlma M Devine, MD  fluticasone [facility](FLONASE) 50 MCG/ACT nasal spray Place 2 sprays into both nostrils daily. Patient not taking: Reported on 06/10/2016 04/06/16   Eulah PontNina Blum, MD  folic acid (FOLVITE) 1 MG tablet Take 1 tablet (1 mg total) by mouth daily. Patient not taking: Reported on 04/03/2016 02/08/16   Alison MurrayAlma M Devine, MD  loratadine (CLARITIN) 10 MG tablet Take 1 tablet (10 mg total) by mouth daily. Patient not taking: Reported on 06/10/2016  04/06/16   Eulah Pont, MD  pseudoephedrine (SUDAFED) 120 MG 12 hr tablet Take 1 tablet (120 mg total) by mouth 2 (two) times daily. Patient not taking: Reported on 06/10/2016 04/05/16   Eulah Pont, MD    Family History Family History  Problem Relation Age of Onset  . Diabetes Neg Hx   . Stroke Neg Hx   . Cancer Neg Hx   . Clotting disorder Neg Hx     Social History Social History  Substance Use Topics  . Smoking status: Current Every Day Smoker     Packs/day: 0.25    Types: Cigarettes  . Smokeless tobacco: Never Used     Comment: PATIENT STATES HE DO NOT SMOKE/SEEM TO NOT KNOW WHEN  HE STOPPED  . Alcohol use Yes     Comment: 3 beers on weekends     Allergies   Lactose intolerance (gi) and Pork-derived products   Review of Systems Review of Systems  Constitutional: Negative for chills and fever.  HENT: Negative for congestion and sore throat.   Eyes: Negative for visual disturbance.  Respiratory: Positive for shortness of breath. Negative for cough and wheezing.   Cardiovascular: Positive for chest pain and leg swelling. Negative for palpitations.  Gastrointestinal: Negative for abdominal pain, nausea and vomiting.  Genitourinary: Negative for dysuria.  Musculoskeletal: Negative for back pain and neck pain.  Skin: Negative for rash.  Neurological: Negative for dizziness, syncope, weakness, light-headedness, numbness and headaches.     Physical Exam Updated Vital Signs BP 104/66 (BP Location: Left Arm)   Pulse (!) 58   Temp 97.7 F (36.5 C) (Oral)   Resp 15   Ht 5\' 7"  (1.702 m)   Wt 68 kg   SpO2 98%   BMI 23.49 kg/m   Physical Exam  Constitutional: He is oriented to person, place, and time. He appears well-developed and well-nourished. No distress.  Nontoxic appearing.  HENT:  Head: Normocephalic and atraumatic.  Mouth/Throat: Oropharynx is clear and moist.  Eyes: Conjunctivae are normal. Pupils are equal, round, and reactive to light. Right eye exhibits no discharge. Left eye exhibits no discharge.  Neck: Neck supple. No JVD present.  Cardiovascular: Normal rate, regular rhythm, normal heart sounds and intact distal pulses.  Exam reveals no gallop and no friction rub.   No murmur heard. Bilateral radial, posterior tibialis and dorsalis pedis pulses are intact.    Pulmonary/Chest: Effort normal and breath sounds normal. No stridor. No respiratory distress. He has no wheezes. He has no rales. He exhibits no  tenderness.  Lungs are clear to auscultation bilaterally. No increased work of breathing. No rales or rhonchi. Symmetric chest expansion bilaterally.  Abdominal: Soft. There is no tenderness. There is no guarding.  Musculoskeletal: He exhibits edema.  Bilateral LE edema with left greater than right. No calf TTP bilaterally.   Lymphadenopathy:    He has no cervical adenopathy.  Neurological: He is alert and oriented to person, place, and time. Coordination normal.  Skin: Skin is warm and dry. No rash noted. He is not diaphoretic. No erythema. No pallor.  Psychiatric: He has a normal mood and affect. His behavior is normal.  Nursing note and vitals reviewed.    ED Treatments / Results  Labs (all labs ordered are listed, but only abnormal results are displayed) Labs Reviewed  CBC - Abnormal; Notable for the following:       Result Value   Hemoglobin 11.9 (*)    HCT 36.3 (*)  MCV 77.4 (*)    MCH 25.4 (*)    All other components within normal limits  BASIC METABOLIC PANEL  RAPID URINE DRUG SCREEN, HOSP PERFORMED  I-STAT TROPOININ, ED    EKG  EKG Interpretation  Date/Time:  Thursday June 10 2016 07:21:19 EDT Ventricular Rate:  65 PR Interval:  116 QRS Duration: 90 QT Interval:  442 QTC Calculation: 459 R Axis:   113 Text Interpretation:  Normal sinus rhythm Right axis deviation Pulmonary disease pattern T wave abnormality, consider inferolateral ischemia Confirmed by Denton Lank  MD, Caryn Bee ([id number]) on 06/10/2016 8:31:08 AM       Radiology Dg Chest 2 View  Result Date: 06/10/2016 CLINICAL DATA:  Bilateral chest pain and shortness of breath for 1 month EXAM: CHEST  2 VIEW COMPARISON:  04/03/2016 FINDINGS: The heart size and mediastinal contours are within normal limits. Both lungs are clear. The visualized skeletal structures are unremarkable. IMPRESSION: No active cardiopulmonary disease. Electronically Signed   By: Alcide Clever M.D.   On: 06/10/2016 08:17     Procedures Procedures (including critical care time)  Medications Ordered in ED Medications  apixaban (ELIQUIS) tablet 5 mg (5 mg Oral Given 06/10/16 1314)     Initial Impression / Assessment and Plan / ED Course  I have reviewed the triage vital signs and the nursing notes.  Pertinent labs & imaging results that were available during my care of the patient were reviewed by me and considered in my medical decision making (see chart for details).     This  is a 51 y.o. Male with a history of protein C deficiency, with previous PE, and DVT who presents to the ED complaining of three days of chest pain and shortness of breath. He reports constant bilateral anterior chest pain for three days that is worse with deep inspiration. He also reports he has had some increased left leg swelling, without pain, for the past week. He reports he has not been taking his Eliquis for the past week and half due to its cost.  On exam the patient is afebrile and nontoxic appearing. He is not tachypneic, tachycardic or hypoxic. Lungs are clear to auscultation bilaterally. He does have bilateral lower extremity edema that is worse on his left side. No calf tenderness to palpation bilaterally. He appears comfortable and not in any distress.  On chart review, patient's last 5 CT angiograms of his chest showed no PE. Last CT scan that showed a PE was in 2015.  Troponin is not elevated. BMP and CBC are unremarkable. Chest x-ray is unremarkable.  Patient has a history of protein C deficiency. He needs to be taking his liquids. He ambulates in the hall without any hypoxia or tachypnea. I provided the patient with his own request today in the emergency department. Case management saw the patient and got him follow-up in a medical home for this week. [facility] to the pharmacy today to refill his request. Patient agrees with this plan. I advised the patient to follow-up with their primary care  provider this week. I advised the patient to return to the emergency department with new or worsening symptoms or new concerns. The patient verbalized understanding and agreement with plan.    This patient was discussed with and evaluated by Dr. Denton Lank who agrees with assessment and plan.   Final Clinical Impressions(s) / ED Diagnoses   Final diagnoses:  Precordial pain  SOB (shortness of breath)  Protein C deficiency (HCC)  Leg swelling  Medical non-compliance    New Prescriptions Current Discharge Medication List       Everlene Farrier, PA-C 06/10/16 1324    Cathren Laine, MD 06/10/16 1630

## 2016-06-10 NOTE — ED Triage Notes (Signed)
Per Pt, Pt is coming from home with complaints of SOB and bilateral chest pain x3 days. Pt denies any cough congestion. Reports some lightheadedness and running out of his blood thinner a 1.5 weeks ago. Pt is alert and oriented x4 upon arrival. Hx of PE and DVT.

## 2016-06-10 NOTE — ED Notes (Signed)
Pt. States he does not have to urinate at this time.

## 2016-06-10 NOTE — ED Notes (Signed)
Pt states he is unable to urinate at this time.

## 2016-06-13 ENCOUNTER — Emergency Department (HOSPITAL_COMMUNITY)
Admission: EM | Admit: 2016-06-13 | Discharge: 2016-06-13 | Disposition: A | Discharge status: Home / Self Care | Payer: Self-pay | Attending: Emergency Medicine | Admitting: Emergency Medicine

## 2016-06-13 ENCOUNTER — Emergency Department (HOSPITAL_COMMUNITY): Payer: Self-pay

## 2016-06-13 ENCOUNTER — Encounter (HOSPITAL_COMMUNITY): Payer: Self-pay | Admitting: Nurse Practitioner

## 2016-06-13 DIAGNOSIS — F17210 Nicotine dependence, cigarettes, uncomplicated: Secondary | ICD-10-CM | POA: Insufficient documentation

## 2016-06-13 DIAGNOSIS — F1410 Cocaine abuse, uncomplicated: Secondary | ICD-10-CM | POA: Insufficient documentation

## 2016-06-13 DIAGNOSIS — R079 Chest pain, unspecified: Secondary | ICD-10-CM

## 2016-06-13 DIAGNOSIS — R0789 Other chest pain: Secondary | ICD-10-CM | POA: Insufficient documentation

## 2016-06-13 DIAGNOSIS — E875 Hyperkalemia: Secondary | ICD-10-CM | POA: Insufficient documentation

## 2016-06-13 DIAGNOSIS — F1910 Other psychoactive substance abuse, uncomplicated: Secondary | ICD-10-CM

## 2016-06-13 DIAGNOSIS — D72829 Elevated white blood cell count, unspecified: Secondary | ICD-10-CM | POA: Insufficient documentation

## 2016-06-13 DIAGNOSIS — Z7901 Long term (current) use of anticoagulants: Secondary | ICD-10-CM | POA: Insufficient documentation

## 2016-06-13 DIAGNOSIS — Z79899 Other long term (current) drug therapy: Secondary | ICD-10-CM | POA: Insufficient documentation

## 2016-06-13 LAB — COMPREHENSIVE METABOLIC PANEL
ALK PHOS: 43 U/L (ref 38–126)
ALT: 14 U/L — ABNORMAL LOW (ref 17–63)
AST: 44 U/L — ABNORMAL HIGH (ref 15–41)
Albumin: 4.1 g/dL (ref 3.5–5.0)
Anion gap: 7 (ref 5–15)
BILIRUBIN TOTAL: 1.8 mg/dL — AB (ref 0.3–1.2)
BUN: 17 mg/dL (ref 6–20)
CALCIUM: 8.7 mg/dL — AB (ref 8.9–10.3)
CO2: 24 mmol/L (ref 22–32)
Chloride: 107 mmol/L (ref 101–111)
Creatinine, Ser: 1.45 mg/dL — ABNORMAL HIGH (ref 0.61–1.24)
GFR, EST NON AFRICAN AMERICAN: 54 mL/min — AB (ref >60)
Glucose, Bld: 99 mg/dL (ref 65–99)
Potassium: 5.5 mmol/L — ABNORMAL HIGH (ref 3.5–5.1)
Sodium: 138 mmol/L (ref 135–145)
TOTAL PROTEIN: 6.7 g/dL (ref 6.5–8.1)

## 2016-06-13 LAB — I-STAT CHEM 8, ED
BUN: 14 mg/dL (ref 6–20)
Calcium, Ion: 1.13 mmol/L — ABNORMAL LOW (ref 1.15–1.40)
Chloride: 108 mmol/L (ref 101–111)
Creatinine, Ser: 0.9 mg/dL (ref 0.61–1.24)
Glucose, Bld: 86 mg/dL (ref 65–99)
HEMATOCRIT: 33 % — AB (ref 39.0–52.0)
HEMOGLOBIN: 11.2 g/dL — AB (ref 13.0–17.0)
POTASSIUM: 3.6 mmol/L (ref 3.5–5.1)
SODIUM: 143 mmol/L (ref 135–145)
TCO2: 24 mmol/L (ref 0–100)

## 2016-06-13 LAB — CBC WITH DIFFERENTIAL/PLATELET
Basophils Absolute: 0 10*3/uL (ref 0.0–0.1)
Basophils Relative: 0 %
Eosinophils Absolute: 0 10*3/uL (ref 0.0–0.7)
Eosinophils Relative: 0 %
HEMATOCRIT: 34.9 % — AB (ref 39.0–52.0)
HEMOGLOBIN: 11.3 g/dL — AB (ref 13.0–17.0)
LYMPHS ABS: 1 10*3/uL (ref 0.7–4.0)
Lymphocytes Relative: 17 %
MCH: 24.9 pg — AB (ref 26.0–34.0)
MCHC: 32.4 g/dL (ref 30.0–36.0)
MCV: 76.9 fL — ABNORMAL LOW (ref 78.0–100.0)
MONO ABS: 0.2 10*3/uL (ref 0.1–1.0)
MONOS PCT: 4 %
NEUTROS ABS: 4.6 10*3/uL (ref 1.7–7.7)
NEUTROS PCT: 79 %
Platelets: 219 10*3/uL (ref 150–400)
RBC: 4.54 MIL/uL (ref 4.22–5.81)
RDW: 14.9 % (ref 11.5–15.5)
WBC: 5.8 10*3/uL (ref 4.0–10.5)

## 2016-06-13 LAB — I-STAT TROPONIN, ED: TROPONIN I, POC: 0 ng/mL (ref 0.00–0.08)

## 2016-06-13 LAB — RAPID URINE DRUG SCREEN, HOSP PERFORMED
Amphetamines: NOT DETECTED
Barbiturates: NOT DETECTED
Benzodiazepines: NOT DETECTED
Cocaine: POSITIVE — AB
OPIATES: NOT DETECTED
TETRAHYDROCANNABINOL: NOT DETECTED

## 2016-06-13 LAB — ETHANOL: Alcohol, Ethyl (B): 30 mg/dL — ABNORMAL HIGH (ref <5)

## 2016-06-13 MED ORDER — SODIUM CHLORIDE 0.9 % IV BOLUS (SEPSIS)
1000.0000 mL | INTRAVENOUS | Status: AC
  Administered 2016-06-13: 1000 mL via INTRAVENOUS

## 2016-06-13 MED ORDER — APIXABAN 5 MG PO TABS
5.0000 mg | ORAL_TABLET | Freq: Two times a day (BID) | ORAL | Status: DC
  Administered 2016-06-13: 5 mg via ORAL
  Filled 2016-06-13: qty 1

## 2016-06-13 MED ORDER — ASPIRIN 81 MG PO CHEW
324.0000 mg | CHEWABLE_TABLET | Freq: Once | ORAL | Status: AC
  Administered 2016-06-13: 324 mg via ORAL
  Filled 2016-06-13: qty 4

## 2016-06-13 NOTE — ED Provider Notes (Signed)
Patient care assumed from [facility], PA-C at shift change. Please see her note for further.  Plan at shift change was for the patient to complete a fluid bolus and recheck his Chem-8. Patient had elevated creatinine and potassium on blood work here today. Suspect this is related to alcohol and dehydration. My evaluation patient has completed a fluid bolus. He reports feeling better. He is tolerating by mouth. Repeat Chem-8 reveals normalized potassium at 3.6 and a creatinine of 0.90. Both creatinine and potassium have normalized. Patient reports feeling much better. We'll discharge at this time. I encouraged him to follow-up with primary care and to be compliant with his medications. I discussed return precautions. I advised the patient to follow-up with their primary care provider this week. I advised the patient to return to the emergency department with new or worsening symptoms or new concerns. The patient verbalized understanding and agreement with plan.    Results for orders placed or performed during the hospital encounter of 06/13/16                                                                                                                                                                                                                                                                 I-stat Chem 8, ED  Result Value Ref Range   Sodium 143 135 - 145 mmol/L   Potassium 3.6 3.5 - 5.1 mmol/L   Chloride 108 101 - 111 mmol/L   BUN 14 6 - 20 mg/dL   Creatinine, Ser 1.61 0.61 - 1.24 mg/dL   Glucose, Bld 86 65 - 99 mg/dL   Calcium, Ion 0.96 (L) 1.15 - 1.40 mmol/L   TCO2 24 0 - 100 mmol/L   Hemoglobin 11.2 (L) 13.0 - 17.0 g/dL   HCT 04.5 (L) 40.9 - 81.1 %   Vitals:   06/13/16 0700 06/13/16 0730 06/13/16 0800 06/13/16 0830  BP: 116/68 138/88 108/65 107/62  Pulse: (!) 59 89 (!) 55 66  Resp: 15     Temp:      TempSrc:      SpO2: 98%  100% 97% 98%  Weight:      Height:         Substance abuse  Chest pain, unspecified type  Hyperkalemia  Leukocytosis, unspecified type         Everlene Farrier, PA-C 06/13/16 1610    Tomasita Crumble, MD 06/13/16 1416

## 2016-06-13 NOTE — ED Triage Notes (Signed)
Pt transported via EMS. Per EMS patient [place] bus stop and they were called because pt verbalized that he had smoked some marijuana and feels as though it was laced with cocaine because ever since he has been paranoid and really hyper. Patient was observed pacing and with paranoia behavior. Once assessed and placed on ems patient immediately calmed down and was calm and sleeping in route. VSS 170/74, 100HR, 28resp, 99% RA. Pt denies hallucinations, visual disturbance, or pain per ems. Patient is homeless.

## 2016-06-13 NOTE — ED Provider Notes (Signed)
[facility]-EMERGENCY DEPT Provider Note   CSN: [id number] Arrival date & time: 06/13/16  0503     History   Chief Complaint Chief Complaint  Patient presents with  . Paranoid  . Feels like Marijuana was laced with Cocaine     HPI Peter Washington is a 51 y.o. male with a hx of protein C deficiency, with previous PE, and DVT (restarted on Eliquis on 06/10/16), substance induced psychotic disorder, polysubstance abuse presents to the Emergency Department complaining of gradual, persistent, progressively worsening chest pain onset 1 hour ago after smoking Marijuana that he was given.Marland Kitchen  He describes the pain as "hard" and non radiating.  Associated symptoms include mild SOB. Nothing makes it better or worse.  He denies leg swelling, DOE or orthopnea.  Pt denies fever, chills, cough, congestion.    Record review shows patient was evaluated for leg swelling on 06/10/2016. At that time he had been off his L Quist for 1 week due to the cost. He was without tachypnea, tachycardia or hypoxia. He now reports that he did not fill it due to cost; however, [facility] as arranged by case management. He states he did not do this. On further chart review, patient's last 5 CT angiograms of his chest showed no PE. Last CT scan that showed a PE was in 2015.     The history is provided by the patient and medical records. No language interpreter was used.    Past Medical History:  Diagnosis Date  . DVT of lower extremity (deep venous thrombosis) (HCC)   . PE (pulmonary embolism)     Patient Active Problem List   Diagnosis Date Noted  . Jaw pain 04/11/2016  . Sinus congestion 04/03/2016  . Depression 02/02/2016  . DVT (deep venous thrombosis) (HCC) 02/02/2016  . Cocaine dependence, continuous (HCC) 01/19/2016  . 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 Major depressive disorder, recurrent severe without psychotic features (HCC) 01/12/2016    . History of pulmonary embolus (PE) 01/16/2014  . Tobacco use 01/16/2014  . Protein C deficiency (HCC) 12/31/2013    History reviewed. No pertinent surgical history.     Home Medications    Prior to Admission medications   Medication Sig Start Date End Date Taking? Authorizing Provider  apixaban (ELIQUIS) 5 MG TABS tablet Take 1 tablet (5 mg total) by mouth 2 (two) times daily. 06/10/16  Yes Everlene Farrier, PA-C  acetaminophen (TYLENOL) 325 MG tablet Take 2 tablets (650 mg total) by mouth every 6 (six) hours as needed for mild pain (or Fever >/= 101). Patient not taking: Reported on 06/10/2016 04/05/16   Eulah Pont, MD  ARIPiprazole (ABILIFY) 5 MG tablet Take 1 tablet (5 mg total) by mouth daily. Patient not taking: Reported on 04/03/2016 01/20/16   Adonis Brook, NP  citalopram (CELEXA) 20 MG tablet Take 1 tablet (20 mg total) by mouth at bedtime. Patient not taking: Reported on 04/03/2016 01/19/16   Adonis Brook, NP  famotidine (PEPCID) 40 MG tablet Take 1 tablet (40 mg total) by mouth daily. Patient not taking: Reported on 04/03/2016 02/07/16   Alison Murray, MD  fluticasone [facility]) 50 MCG/ACT nasal spray Place 2 sprays into both nostrils daily. Patient not taking: Reported on 06/10/2016 04/06/16   Eulah Pont, MD  folic acid (FOLVITE) 1 MG tablet Take 1 tablet (1 mg total) by mouth daily. Patient not taking: Reported on 04/03/2016 02/08/16   Alison Murray, MD  loratadine (CLARITIN) 10 MG tablet Take 1 tablet (10 mg total) by mouth daily. Patient not taking: Reported on 06/10/2016 04/06/16   Eulah Pont, MD  pseudoephedrine (SUDAFED) 120 MG 12 hr tablet Take 1 tablet (120 mg total) by mouth 2 (two) times daily. Patient not taking: Reported on 06/10/2016 04/05/16   Eulah Pont, MD    Family History Family History  Problem Relation Age of Onset  . Diabetes Neg Hx   . Stroke Neg Hx   . Cancer Neg Hx   . Clotting disorder Neg Hx     Social History Social History  Substance Use Topics  .  Smoking status: Current Every Day Smoker    Packs/day: 0.25    Types: Cigarettes  . Smokeless tobacco: Never Used     Comment: PATIENT STATES HE DO NOT SMOKE/SEEM TO NOT KNOW WHEN  HE STOPPED  . Alcohol use Yes     Comment: 3 beers on weekends     Allergies   Lactose intolerance (gi) and Pork-derived products   Review of Systems Review of Systems  Respiratory: Positive for chest tightness and shortness of breath.   Cardiovascular: Positive for chest pain.  All other systems reviewed and are negative.    Physical Exam Updated Vital Signs BP (!) 143/80 (BP Location: Left Arm)   Pulse 79   Temp 97.9 F (36.6 C) (Oral)   Resp 20   Ht  (1.702 m)   Wt 68 kg   SpO2 100%   BMI 23.49 kg/m   Physical Exam  Constitutional: He appears well-developed and well-nourished. No distress.  Awake, alert, nontoxic appearance  HENT:  Head: Normocephalic and atraumatic.  Mouth/Throat: Oropharynx is clear and moist. No oropharyngeal exudate.  Eyes: Conjunctivae are normal. No scleral icterus.  Neck: Normal range of motion. Neck supple.  Cardiovascular: Normal rate, regular rhythm and intact distal pulses.   Pulmonary/Chest: Effort normal and breath sounds normal. No respiratory distress. He has no wheezes.  Equal chest expansion  Abdominal: Soft. Bowel sounds are normal. He exhibits no mass. There is no tenderness. There is no rebound and no guarding.  Musculoskeletal: Normal range of motion. He exhibits edema.  Bilateral pitting edema - 1+ in the right, 2+ in the left  Neurological: He is alert.  Speech is clear and goal oriented Moves extremities without ataxia  Skin: Skin is warm and dry. He is not diaphoretic.  Psychiatric: He has a normal mood and affect.  Nursing note and vitals reviewed.    ED Treatments / Results  Labs (all labs ordered are listed, but only abnormal results are displayed) Labs Reviewed  CBC WITH DIFFERENTIAL/PLATELET - Abnormal; Notable for the  following:       Result Value   Hemoglobin 11.3 (*)    HCT 34.9 (*)    MCV 76.9 (*)    MCH 24.9 (*)    All other components within normal limits  COMPREHENSIVE METABOLIC PANEL - Abnormal; Notable for the following:    Potassium 5.5 (*)    Creatinine, Ser 1.45 (*)    Calcium 8.7 (*)    AST 44 (*)    ALT 14 (*)    Total Bilirubin 1.8 (*)    GFR calc non Af Amer 54 (*)    All other components within normal limits  RAPID URINE DRUG SCREEN, HOSP PERFORMED - Abnormal; Notable for the following:    Cocaine POSITIVE (*)    All other components within normal limits  ETHANOL -  Abnormal; Notable for the following:    Alcohol, Ethyl (B) 30 (*)    All other components within normal limits  I-STAT TROPOININ, ED    EKG  EKG Interpretation  Date/Time:  Sunday June 13 2016 05:43:21 EDT Ventricular Rate:  64 PR Interval:    QRS Duration: 84 QT Interval:  410 QTC Calculation: 423 R Axis:   43 Text Interpretation:  Sinus rhythm Borderline T wave abnormalities TWI improved Confirmed by Erroll Luna [phone]) on 06/13/2016 6:00:59 AM       Radiology Dg Chest 2 View  Result Date: 06/13/2016 CLINICAL DATA:  Paranoid and hyper after smoking strokes. Central LEFT chest pain. EXAM: CHEST  2 VIEW COMPARISON:  Chest radiograph June 10, 2016 FINDINGS: Cardiomediastinal silhouette is normal. No pleural effusions or focal consolidations. Trachea projects midline and there is no pneumothorax. Soft tissue planes and included osseous structures are non-suspicious. Moderate degenerative change of thoracic spine. IMPRESSION: No active cardiopulmonary disease. Electronically Signed   By: Awilda Metro M.D.   On: 06/13/2016 05:42    Procedures Procedures (including critical care time)  Medications Ordered in ED Medications  apixaban (ELIQUIS) tablet 5 mg (5 mg Oral Given 06/13/16 0549)  sodium chloride 0.9 % bolus 1,000 mL (not administered)  aspirin chewable tablet 324 mg (324 mg Oral Given  06/13/16 0537)     Initial Impression / Assessment and Plan / ED Course  I have reviewed the triage vital signs and the nursing notes.  Pertinent labs & imaging results that were available during my care of the patient were reviewed by me and considered in my medical decision making (see chart for details).     Patient presents with chest pain after smoking marijuana tonight. He reports he believes it was laced with something.  A mass reports that he was agitated and anxious on scene but calmed down considerably in route. He has been calm alert and oriented for me in the emergency department.  Labs with mild leukocytosis, hyperkalemia and elevated serum creatinine. Suspect dehydration. We'll give fluids and reassess.  6:45 AM At shift change care transferred to a Richardean Sale, PA-C who will follow labs, reassess and disposition accordingly.  Final Clinical Impressions(s) / ED Diagnoses   Final diagnoses:  Substance abuse  Chest pain, unspecified type  Hyperkalemia  Leukocytosis, unspecified type    New Prescriptions New Prescriptions   No medications on file     [place] [facility], PA-C 06/13/16 6045    Tomasita Crumble, MD 06/13/16 1418

## 2016-06-17 ENCOUNTER — Inpatient Hospital Stay (INDEPENDENT_AMBULATORY_CARE_PROVIDER_SITE_OTHER): Payer: Self-pay | Admitting: Physician Assistant

## 2016-12-24 ENCOUNTER — Encounter (HOSPITAL_COMMUNITY): Payer: Self-pay | Admitting: Emergency Medicine

## 2016-12-24 ENCOUNTER — Emergency Department (HOSPITAL_COMMUNITY): Payer: Self-pay

## 2016-12-24 DIAGNOSIS — R001 Bradycardia, unspecified: Secondary | ICD-10-CM | POA: Diagnosis present

## 2016-12-24 DIAGNOSIS — T45516A Underdosing of anticoagulants, initial encounter: Secondary | ICD-10-CM | POA: Diagnosis present

## 2016-12-24 DIAGNOSIS — D509 Iron deficiency anemia, unspecified: Secondary | ICD-10-CM | POA: Diagnosis present

## 2016-12-24 DIAGNOSIS — D6859 Other primary thrombophilia: Secondary | ICD-10-CM | POA: Diagnosis present

## 2016-12-24 DIAGNOSIS — Z91120 Patient's intentional underdosing of medication regimen due to financial hardship: Secondary | ICD-10-CM

## 2016-12-24 DIAGNOSIS — Z86718 Personal history of other venous thrombosis and embolism: Secondary | ICD-10-CM

## 2016-12-24 DIAGNOSIS — E739 Lactose intolerance, unspecified: Secondary | ICD-10-CM | POA: Diagnosis present

## 2016-12-24 DIAGNOSIS — F17210 Nicotine dependence, cigarettes, uncomplicated: Secondary | ICD-10-CM | POA: Diagnosis present

## 2016-12-24 DIAGNOSIS — Z91018 Allergy to other foods: Secondary | ICD-10-CM

## 2016-12-24 DIAGNOSIS — I2699 Other pulmonary embolism without acute cor pulmonale: Principal | ICD-10-CM | POA: Diagnosis present

## 2016-12-24 LAB — CBC
HEMATOCRIT: 36.1 % — AB (ref 39.0–52.0)
HEMOGLOBIN: 11.5 g/dL — AB (ref 13.0–17.0)
MCH: 25.4 pg — AB (ref 26.0–34.0)
MCHC: 31.9 g/dL (ref 30.0–36.0)
MCV: 79.9 fL (ref 78.0–100.0)
Platelets: 177 10*3/uL (ref 150–400)
RBC: 4.52 MIL/uL (ref 4.22–5.81)
RDW: 14.1 % (ref 11.5–15.5)
WBC: 5.9 10*3/uL (ref 4.0–10.5)

## 2016-12-24 LAB — BASIC METABOLIC PANEL
ANION GAP: 6 (ref 5–15)
BUN: 16 mg/dL (ref 6–20)
CALCIUM: 8.7 mg/dL — AB (ref 8.9–10.3)
CO2: 27 mmol/L (ref 22–32)
Chloride: 105 mmol/L (ref 101–111)
Creatinine, Ser: 1.24 mg/dL (ref 0.61–1.24)
GFR calc Af Amer: >60 mL/min (ref >60)
GFR calc non Af Amer: >60 mL/min (ref >60)
GLUCOSE: 92 mg/dL (ref 65–99)
POTASSIUM: 3.7 mmol/L (ref 3.5–5.1)
Sodium: 138 mmol/L (ref 135–145)

## 2016-12-24 LAB — I-STAT TROPONIN, ED: Troponin i, poc: 0 ng/mL (ref 0.00–0.08)

## 2016-12-25 ENCOUNTER — Inpatient Hospital Stay (HOSPITAL_COMMUNITY)
Admission: EM | Admit: 2016-12-25 | Discharge: 2016-12-27 | DRG: 176 | Disposition: A | Discharge status: Home / Self Care | Payer: Self-pay | Attending: Internal Medicine | Admitting: Internal Medicine

## 2016-12-25 ENCOUNTER — Encounter (HOSPITAL_COMMUNITY): Payer: Self-pay | Admitting: Internal Medicine

## 2016-12-25 ENCOUNTER — Emergency Department (HOSPITAL_COMMUNITY): Payer: Self-pay

## 2016-12-25 ENCOUNTER — Inpatient Hospital Stay (HOSPITAL_COMMUNITY): Payer: Self-pay

## 2016-12-25 DIAGNOSIS — F17210 Nicotine dependence, cigarettes, uncomplicated: Secondary | ICD-10-CM

## 2016-12-25 DIAGNOSIS — I340 Nonrheumatic mitral (valve) insufficiency: Secondary | ICD-10-CM

## 2016-12-25 DIAGNOSIS — Z86718 Personal history of other venous thrombosis and embolism: Secondary | ICD-10-CM

## 2016-12-25 DIAGNOSIS — I2699 Other pulmonary embolism without acute cor pulmonale: Principal | ICD-10-CM | POA: Diagnosis present

## 2016-12-25 DIAGNOSIS — Z7901 Long term (current) use of anticoagulants: Secondary | ICD-10-CM

## 2016-12-25 DIAGNOSIS — D6859 Other primary thrombophilia: Secondary | ICD-10-CM | POA: Diagnosis present

## 2016-12-25 LAB — HEPARIN LEVEL (UNFRACTIONATED)
Heparin Unfractionated: 0.32 IU/mL (ref 0.30–0.70)
Heparin Unfractionated: 0.25 IU/mL — ABNORMAL LOW (ref 0.30–0.70)

## 2016-12-25 LAB — ECHOCARDIOGRAM COMPLETE
E decel time: 253 msec
E/e' ratio: 4.94
FS: 37 % (ref 28–44)
IVS/LV PW RATIO, ED: 0.84
LA ID, A-P, ES: 33 mm
LA diam end sys: 33 mm
LA diam index: 1.86 cm/m2
LA vol A4C: 56.6 ml
LA vol index: 31.9 mL/m2
LA vol: 56.4 mL
LV E/e' medial: 4.94
LV E/e'average: 4.94
LV PW d: 9.26 mm — AB (ref 0.6–1.1)
LV e' LATERAL: 13.1 cm/s
LVOT SV: 87 mL
LVOT VTI: 25 cm
LVOT area: 3.46 cm2
LVOT diameter: 21 mm
LVOT peak grad rest: 4 mmHg
LVOT peak vel: 102 cm/s
Lateral S' vel: 15.3 cm/s
MV Dec: 253
MV pk A vel: 56.1 m/s
MV pk E vel: 64.7 m/s
RV sys press: 52 mmHg
Reg peak vel: 351 cm/s
TAPSE: 28.1 mm
TDI e' lateral: 13.1
TDI e' medial: 9.03
TR max vel: 351 cm/s

## 2016-12-25 LAB — I-STAT TROPONIN, ED: TROPONIN I, POC: 0.01 ng/mL (ref 0.00–0.08)

## 2016-12-25 LAB — MRSA PCR SCREENING: MRSA by PCR: NEGATIVE

## 2016-12-25 MED ORDER — HEPARIN BOLUS VIA INFUSION
4000.0000 [IU] | Freq: Once | INTRAVENOUS | Status: AC
  Administered 2016-12-25: 4000 [IU] via INTRAVENOUS
  Filled 2016-12-25: qty 4000

## 2016-12-25 MED ORDER — OXYCODONE HCL 5 MG PO TABS
5.0000 mg | ORAL_TABLET | ORAL | Status: DC | PRN
  Administered 2016-12-25 – 2016-12-27 (×6): 5 mg via ORAL
  Filled 2016-12-25 (×6): qty 1

## 2016-12-25 MED ORDER — IOPAMIDOL (ISOVUE-370) INJECTION 76%
INTRAVENOUS | Status: AC
  Administered 2016-12-25: 100 mL
  Filled 2016-12-25: qty 100

## 2016-12-25 MED ORDER — TRAMADOL HCL 50 MG PO TABS
50.0000 mg | ORAL_TABLET | Freq: Four times a day (QID) | ORAL | Status: DC | PRN
  Administered 2016-12-25: 50 mg via ORAL
  Filled 2016-12-25: qty 1

## 2016-12-25 MED ORDER — ADULT MULTIVITAMIN W/MINERALS CH
1.0000 | ORAL_TABLET | Freq: Every day | ORAL | Status: DC
  Administered 2016-12-25 – 2016-12-27 (×3): 1 via ORAL
  Filled 2016-12-25 (×3): qty 1

## 2016-12-25 MED ORDER — FOLIC ACID 1 MG PO TABS
1.0000 mg | ORAL_TABLET | Freq: Every day | ORAL | Status: DC
  Administered 2016-12-25 – 2016-12-27 (×3): 1 mg via ORAL
  Filled 2016-12-25 (×3): qty 1

## 2016-12-25 MED ORDER — VITAMIN B-1 100 MG PO TABS
100.0000 mg | ORAL_TABLET | Freq: Every day | ORAL | Status: DC
  Administered 2016-12-25 – 2016-12-27 (×3): 100 mg via ORAL
  Filled 2016-12-25 (×3): qty 1

## 2016-12-25 MED ORDER — KETOROLAC TROMETHAMINE 30 MG/ML IJ SOLN
15.0000 mg | Freq: Once | INTRAMUSCULAR | Status: AC
  Administered 2016-12-25: 15 mg via INTRAVENOUS
  Filled 2016-12-25: qty 1

## 2016-12-25 MED ORDER — ACETAMINOPHEN 325 MG PO TABS: 650.0000 mg | ORAL_TABLET | Freq: Four times a day (QID) | ORAL | Status: DC | PRN

## 2016-12-25 MED ORDER — ACETAMINOPHEN 650 MG RE SUPP: 650.0000 mg | Freq: Four times a day (QID) | RECTAL | Status: DC | PRN

## 2016-12-25 MED ORDER — SODIUM CHLORIDE 0.9 % IV SOLN
INTRAVENOUS | Status: DC
  Administered 2016-12-25 – 2016-12-26 (×3): via INTRAVENOUS

## 2016-12-25 MED ORDER — HEPARIN (PORCINE) IN NACL 100-0.45 UNIT/ML-% IJ SOLN
1400.0000 [IU]/h | INTRAMUSCULAR | Status: DC
  Administered 2016-12-25: 1350 [IU]/h via INTRAVENOUS
  Administered 2016-12-25: 1200 [IU]/h via INTRAVENOUS
  Filled 2016-12-25 (×3): qty 250

## 2016-12-25 MED ORDER — HEPARIN (PORCINE) IN NACL 100-0.45 UNIT/ML-% IJ SOLN: 16.0000 [IU]/kg/h | INTRAMUSCULAR | Status: DC

## 2016-12-25 NOTE — Plan of Care (Signed)
Problem: Pain Managment: Goal: General experience of comfort will improve Outcome: Progressing Pt c/o back/chest pain unrelieved by current PRN medication. MD notified and new PRN orders initiated. Patient educated on new medication including side effects. Patient verbalizes increased relief of pain with new medication

## 2016-12-25 NOTE — Progress Notes (Signed)
ANTICOAGULATION CONSULT NOTE - Initial Consult  Pharmacy Consult for Heparin Indication: pulmonary embolus  Allergies  Allergen Reactions  . Lactose Intolerance (Gi) Diarrhea and Other (See Comments)    Causes acne, diarrhea and constipation  . Pork-Derived Products Nausea And Vomiting    Patient Measurements: Height:  (167.6 cm) Weight: 150 lb (68 kg) IBW/kg (Calculated) : 63.8  Vital Signs: Temp: 97.7 F (36.5 C) (10/12 2300) Temp Source: Oral (10/12 2300) BP: 116/73 (10/13 0545) Pulse Rate: 41 (10/13 0545)  Labs:  Recent Labs  12/24/16 2301  HGB 11.5*  HCT 36.1*  PLT 177  CREATININE 1.24    Estimated Creatinine Clearance: 62.2 mL/min (by C-G formula based on SCr of 1.24 mg/dL).   Medical History: Past Medical History:  Diagnosis Date  . DVT of lower extremity (deep venous thrombosis) (HCC)   . PE (pulmonary embolism)      Assessment: 51 y.o. male with new B PE for heparin.  Pt reports not taking Eliquis for more than a year. Goal of Therapy:  Heparin level 0.3-0.7 units/ml Monitor platelets by anticoagulation protocol: Yes   Plan:  Heparin 4000 units IV bolus, then start heparin 1200 units/hr Check heparin level in 6 hours.   Lavar Rosenzweig, Gary Fleet 12/25/2016,7:17 AM

## 2016-12-25 NOTE — Progress Notes (Addendum)
ANTICOAGULATION CONSULT NOTE  Pharmacy Consult for Heparin Indication: pulmonary embolus  Allergies  Allergen Reactions  . Lactose Intolerance (Gi) Diarrhea and Other (See Comments)    Causes acne, diarrhea and constipation  . Pork-Derived Products Nausea And Vomiting    Patient Measurements: Height:  (167.6 cm) Weight: 150 lb (68 kg) IBW/kg (Calculated) : 63.8  Vital Signs: BP: 108/61 (10/13 1115) Pulse Rate: 48 (10/13 1115)  Labs:  Recent Labs  12/24/16 2301 12/25/16 1458  HGB 11.5*  --   HCT 36.1*  --   PLT 177  --   HEPARINUNFRC  --  0.32  CREATININE 1.24  --     Estimated Creatinine Clearance: 62.2 mL/min (by C-G formula based on SCr of 1.24 mg/dL).   Medical History: Past Medical History:  Diagnosis Date  . DVT of lower extremity (deep venous thrombosis) (HCC)   . PE (pulmonary embolism)      Assessment: 51 y.o. male with new bilateral PE continuing on heparin.  Pt reports not taking Eliquis for more than a year. Initial heparin level therapeutic at 0.32. Hg 11.5, plt wnl. No bleed documented.  Goal of Therapy:  Heparin level 0.3-0.7 units/ml Monitor platelets by anticoagulation protocol: Yes   Plan:  Heparin at 1200 units/hr Check heparin level in 6 hours to confirm Daily heparin level/CBC Monitor for s/sx bleeding    Babs Bertin, PharmD, BCPS Clinical Pharmacist 12/25/2016 4:07 PM   ADDENDUM:  Heparin level now slightly low at 0.25 on 1200 units/hr. No issues with IV line or bleeding per RN.  Plan: Increase heparin to 1350 units/hr Check heparin level in 6 hours to confirm Daily heparin level/CBC Monitor for s/sx bleeding  Babs Bertin, PharmD, BCPS Clinical Pharmacist 12/25/2016 9:46 PM

## 2016-12-25 NOTE — H&P (Signed)
Date: 12/25/2016               Patient Name:  Peter Washington MRN: [id number]  DOB: [birth year] Age / Sex: 51 y.o., male   PCP: Placey, Chales Abrahams, NP         Medical Service: Internal Medicine Teaching Service         Attending Physician: Dr. Sandre Kitty Elwin Mocha, MD    First Contact: Dr. Thornell Mule Pager: [phone]  Second Contact: Dr. Nyra Market Pager: [phone]       After Hours (After 5p/  First Contact Pager: [phone]  weekends / holidays): Second Contact Pager: [phone]   Chief Complaint: CP/ SOB  History of Present Illness: Patient is a 51 year old male with past medical history of cocaine abuse, tobacco use, protein C deficiency, pulmonary embolism in 2015, DVT in 2017, depression.  Presenting with chest pain and shortness of breath.  Patient said he noticed some dizziness and tachycardia earlier in the week.  Then on Friday morning, he began to be 2 He had a sharp pain and pressure in the right side of his chest and back.  The pain was described as sharp with an associated pressure it was worse with taking deep breaths.  The pain was non-radiating and there were no alleviating factors.  Pt denies abd pain, nausea, vomiting or diarrhea, he has not lost conciousness.  From chart review looks like the patient has been on xarelto in the past and more recently was given apixaban by the health department.  When asking the patient about this he mentions that he has not been on any of his medications since March of this year.  When asked why he mentions that it has been more and more difficult to get the anticoagulant medicine due to cost and the trouble he has to go through to get it through the free programs.    ED course: Patient arrived short of breath, bradycardic, patient had 2 negative i-STAT troponins, chest x-ray that showed some possible bibasilar minimal atelectasis, EKG with sinus brady and nonspecific ST changes,  CT angio performed which demonstrated pulmonary embolus to all  lobes of both lungs including at the right main pulmonary artery  and the RV/LV ratio of 1.2 was compatible with right heart strain and at least submassive pulmonary embolus.  A code PE was called, critical care evaluated the patient and decided to just watch for now, patient was started on heparin drip.    Meds:  Current Meds  Medication Sig  . acetaminophen (TYLENOL) 325 MG tablet Take 2 tablets (650 mg total) by mouth every 6 (six) hours as needed for mild pain (or Fever >/= 101).     Allergies: Allergies as of 12/24/2016 - Review Complete 12/24/2016  Allergen Reaction Noted  . Lactose intolerance (gi) Diarrhea and Other (See Comments) 04/03/2016  . Pork-derived products Nausea And Vomiting 03/04/2014   Past Medical History:  Diagnosis Date  . DVT of lower extremity (deep venous thrombosis) (HCC)   . PE (pulmonary embolism)     Family History:  Family History  Problem Relation Age of Onset  . Diabetes Neg Hx   . Stroke Neg Hx   . Cancer Neg Hx   . Clotting disorder Neg Hx     Social History:  Social History   Social History  . Marital status: Single    Spouse name: N/A  . Number of children: N/A  . Years of education: N/A   Occupational  History  . Not on file.   Social History Main Topics  . Smoking status: Current Every Day Smoker    Packs/day: 0.25    Types: Cigarettes  . Smokeless tobacco: Never Used     Comment: PATIENT STATES HE DO NOT SMOKE/SEEM TO NOT KNOW WHEN  HE STOPPED  . Alcohol use Yes     Comment: 3 beers on weekends  . Drug use: No  . Sexual activity: Not on file   Other Topics Concern  . Not on file   Social History Narrative  . No narrative on file    Review of Systems: A complete ROS was negative except as per HPI.   Physical Exam: Blood pressure 107/68, pulse (!) 45, temperature 97.7 F (36.5 C), temperature source Oral, resp. rate 15, height  (1.676 m), weight 150 lb (68 kg), SpO2 98 %. Physical Exam  Constitutional: He is  oriented to person, place, and time. He appears well-developed and well-nourished.  HENT:  Head: Normocephalic and atraumatic.  Eyes: Right eye exhibits no discharge. Left eye exhibits no discharge. No scleral icterus.  Cardiovascular: Regular rhythm and intact distal pulses.  Bradycardia present.  PMI is displaced (to the right).  Exam reveals no gallop and no friction rub.   No murmur heard. Pulmonary/Chest: Effort normal and breath sounds normal. No respiratory distress. He has no wheezes. He has no rales.  Abdominal: Soft. Bowel sounds are normal. He exhibits no distension and no mass. There is no tenderness. There is no guarding.  Musculoskeletal: He exhibits no edema or deformity.  Neurological: He is alert and oriented to person, place, and time.  Skin: Skin is warm and dry.  Psychiatric: He has a normal mood and affect.    EKG: personally reviewed my interpretation is sinus bradycardia, T wave inversions in leads 3 and V1,   CXR: personally reviewed my interpretation is mild bibasilar atelectasis  Assessment & Plan by Problem: Active Problems:   Pulmonary emboli (HCC)   Acute pulmonary embolism [facility])  Patient is a 51 year old male with past medical history of cocaine abuse, tobacco use, protein C deficiency, pulmonary embolism in 2015, DVT in 2017, depression.  Presenting with chest pain and shortness of breath that began Friday.  He was found to have multiple bilateral PEs by CT angio.  Code PE called heparin started, no further intervention for now per pulmcrit.     Pulmonary Embolism: Patient presenting with shortness of breath and chest pain/back pain found to have multiple pulmonary emboli to all lobes of bilateral lungs with right heart strain.  Code PE was called, pulmonary/critical care evaluated patient and did not feel further intervention was warranted at this time other than the heparin drip.  -Continue IV heparin -Continuous cardiac monitoring, continuous pulse  ox -Echocardiogram ordered -Repeat EKG -Trend troponins -IV NS 125 mL per hour for preload support  VTE ppx: pt on heparin drip  Dispo: Admit patient to Observation with expected length of stay less than 2 midnights.  Signed: Angelita Ingles, MD 12/25/2016, 9:58 AM  Thornell Mule MD PGY-1 Internal Medicine Pager # [phone]

## 2016-12-25 NOTE — ED Notes (Signed)
ED Provider at bedside. 

## 2016-12-25 NOTE — Progress Notes (Signed)
  Echocardiogram 2D Echocardiogram has been performed.  Delcie Roch 12/25/2016, 4:13 PM

## 2016-12-25 NOTE — ED Provider Notes (Signed)
[facility]-EMERGENCY DEPT Provider Note   CSN: [id number] Arrival date & time: 12/24/16  2254     History   Chief Complaint Chief Complaint  Patient presents with  . Chest Pain    HPI Peter Washington is a 51 y.o. male.  The history is provided by the patient.  Chest Pain   This is a recurrent problem. The current episode started yesterday. The problem occurs constantly. The problem has not changed since onset.The pain is associated with rest. The pain is present in the substernal region (and between shoulder blades ). The pain is severe. The quality of the pain is described as sharp. The pain radiates to the upper back. Exacerbated by: nothing. Associated symptoms include shortness of breath. Pertinent negatives include no abdominal pain, no exertional chest pressure, no palpitations, no PND and no sputum production. He has tried nothing for the symptoms. The treatment provided no relief. Risk factors include male gender.  His past medical history is significant for DVT and PE.  Pertinent negatives for past medical history include no aneurysm.  Pertinent negatives for family medical history include: no aortic dissection.  Procedure history is negative for cardiac catheterization.    Past Medical History:  Diagnosis Date  . DVT of lower extremity (deep venous thrombosis) (HCC)   . PE (pulmonary embolism)     Patient Active Problem List   Diagnosis Date Noted  . Jaw pain 04/11/2016  . Sinus congestion 04/03/2016  . Depression 02/02/2016  . DVT (deep venous thrombosis) (HCC) 02/02/2016  . Cocaine dependence, continuous (HCC) 01/19/2016  . 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 History of pulmonary embolus (PE) 01/16/2014  . Tobacco use 01/16/2014  . Protein C deficiency (HCC) 12/31/2013    History reviewed. No pertinent surgical history.     Home  Medications    Prior to Admission medications   Medication Sig Start Date End Date Taking? Authorizing Provider  acetaminophen (TYLENOL) 325 MG tablet Take 2 tablets (650 mg total) by mouth every 6 (six) hours as needed for mild pain (or Fever >/= 101). 04/05/16  Yes Eulah Pont, MD    Family History Family History  Problem Relation Age of Onset  . Diabetes Neg Hx   . Stroke Neg Hx   . Cancer Neg Hx   . Clotting disorder Neg Hx     Social History Social History  Substance Use Topics  . Smoking status: Current Every Day Smoker    Packs/day: 0.25    Types: Cigarettes  . Smokeless tobacco: Never Used     Comment: PATIENT STATES HE DO NOT SMOKE/SEEM TO NOT KNOW WHEN  HE STOPPED  . Alcohol use Yes     Comment: 3 beers on weekends     Allergies   Lactose intolerance (gi) and Pork-derived products   Review of Systems Review of Systems  Respiratory: Positive for shortness of breath. Negative for sputum production.   Cardiovascular: Positive for chest pain. Negative for palpitations, leg swelling and PND.  Gastrointestinal: Negative for abdominal pain.  All other systems reviewed and are negative.    Physical Exam Updated Vital Signs BP 116/73   Pulse (!) 41   Temp 97.7 F (36.5 C) (Oral)   Resp 11   Ht  (1.676 m)   Wt 68 kg (150 lb)   SpO2 94%   BMI 24.21 kg/m   Physical Exam  Constitutional: He is oriented  to person, place, and time. He appears well-developed and well-nourished. No distress.  HENT:  Head: Normocephalic and atraumatic.  Mouth/Throat: No oropharyngeal exudate.  Eyes: Pupils are equal, round, and reactive to light. Conjunctivae are normal.  Neck: Normal range of motion. Neck supple. No JVD present.  Cardiovascular: Normal rate, regular rhythm, normal heart sounds and intact distal pulses.   Pulmonary/Chest: Effort normal and breath sounds normal. He has no wheezes. He has no rales.  Abdominal: Soft. Bowel sounds are normal.  Musculoskeletal:  Normal range of motion.  Neurological: He is alert and oriented to person, place, and time. He displays normal reflexes.  Skin: Skin is warm and dry. Capillary refill takes less than 2 seconds.  Psychiatric: He has a normal mood and affect.     ED Treatments / Results   Vitals:   12/25/16 0530 12/25/16 0545  BP: 105/65 116/73  Pulse: (!) 41 (!) 41  Resp: 15 11  Temp:    SpO2: 100% 94%    Labs (all labs ordered are listed, but only abnormal results are displayed)  Results for orders placed or performed during the hospital encounter of 12/25/16  Basic metabolic panel  Result Value Ref Range   Sodium 138 135 - 145 mmol/L   Potassium 3.7 3.5 - 5.1 mmol/L   Chloride 105 101 - 111 mmol/L   CO2 27 22 - 32 mmol/L   Glucose, Bld 92 65 - 99 mg/dL   BUN 16 6 - 20 mg/dL   Creatinine, Ser 1.91 0.61 - 1.24 mg/dL   Calcium 8.7 (L) 8.9 - 10.3 mg/dL   GFR calc non Af Amer >60 >60 mL/min   GFR calc Af Amer >60 >60 mL/min   Anion gap 6 5 - 15  CBC  Result Value Ref Range   WBC 5.9 4.0 - 10.5 K/uL   RBC 4.52 4.22 - 5.81 MIL/uL   Hemoglobin 11.5 (L) 13.0 - 17.0 g/dL   HCT 47.8 (L) 29.5 - 62.1 %   MCV 79.9 78.0 - 100.0 fL   MCH 25.4 (L) 26.0 - 34.0 pg   MCHC 31.9 30.0 - 36.0 g/dL   RDW 30.8 65.7 - 84.6 %   Platelets 177 150 - 400 K/uL  I-stat troponin, ED  Result Value Ref Range   Troponin i, poc 0.00 0.00 - 0.08 ng/mL   Comment 3          I-stat troponin, ED  Result Value Ref Range   Troponin i, poc 0.01 0.00 - 0.08 ng/mL   Comment 3           Dg Chest 2 View  Result Date: 12/24/2016 CLINICAL DATA:  Right-sided chest pain. EXAM: CHEST  2 VIEW COMPARISON:  06/13/2016 FINDINGS: Cardiomediastinal silhouette is normal. Mediastinal contours appear intact. There is no evidence of pleural effusion or pneumothorax. Low lung volumes. Subtle peribronchial airspace consolidation in bilateral lung bases. Osseous structures are without acute abnormality. Soft tissues are grossly normal.  IMPRESSION: Subtle peribronchial airspace consolidation in bilateral lung bases may represent atelectasis, infectious consolidation or aspiration. Electronically Signed   By: Ted Mcalpine M.D.   On: 12/24/2016 23:54   Ct Angio Chest Pe W And/or Wo Contrast  Result Date: 12/25/2016 CLINICAL DATA:  Acute onset of right-sided chest pain. Initial encounter. EXAM: CT ANGIOGRAPHY CHEST WITH CONTRAST TECHNIQUE: Multidetector CT imaging of the chest was performed using the standard protocol during bolus administration of intravenous contrast. Multiplanar CT image reconstructions and MIPs were obtained to evaluate  the vascular anatomy. CONTRAST:  75 mL of Isovue 370 IV contrast COMPARISON:  Chest radiograph performed 12/24/2016, and CTA of the chest performed 02/01/2016 FINDINGS: Cardiovascular: There is pulmonary embolus to all lobes of both lungs, including at the right main pulmonary artery. The RV/LV ratio of 1.2 is compatible with right heart strain and at least submassive pulmonary embolus. The heart is normal in size. The thoracic aorta is unremarkable. The great vessels are within normal limits. Mediastinum/Nodes: A mildly enlarged subcarinal node measures 1.2 cm in short axis. No additional mediastinal lymphadenopathy is seen. No pericardial effusion is identified. The visualized portions of the thyroid gland are unremarkable. No axillary lymphadenopathy is seen. Lungs/Pleura: Mild bibasilar atelectasis is noted. The lungs are otherwise clear. There is no evidence of pulmonary infarct at this time. No pleural effusion or pneumothorax is seen. No masses are identified. Upper Abdomen: The visualized portions of the liver and spleen are grossly unremarkable. There is reflux of contrast into the hepatic veins and IVC. The pancreas is not well assessed without contrast. The visualized portions of the adrenal glands and kidneys are grossly unremarkable. Musculoskeletal: No acute osseous abnormalities are  identified. The visualized musculature is unremarkable in appearance. Review of the MIP images confirms the above findings. IMPRESSION: 1. Pulmonary embolus to all lobes of both lungs, including at the right main pulmonary artery. CT evidence of right heart strain (RV/LV Ratio = 1.2) consistent with at least submassive (intermediate risk) PE. The presence of right heart strain has been associated with an increased risk of morbidity and mortality. Please activate Code PE by paging [phone]. 2. Mildly enlarged 1.2 cm subcarinal node noted. 3. Mild bibasilar atelectasis.  Lungs otherwise clear. Critical Value/emergent results were called by telephone at the time of interpretation on 12/25/2016 at 6:47 am to Dr. Cy Blamer, who verbally acknowledged these results. Electronically Signed   By: Roanna Raider M.D.   On: 12/25/2016 06:51    EKG  EKG Interpretation  Date/Time:  Friday December 24 2016 22:58:30 EDT Ventricular Rate:  52 PR Interval:  124 QRS Duration: 92 QT Interval:  468 QTC Calculation: 435 R Axis:   118 Text Interpretation:  Sinus bradycardia Confirmed by Aislee Landgren ([id number]) on 12/25/2016 4:55:05 AM       Radiology Dg Chest 2 View  Result Date: 12/24/2016 CLINICAL DATA:  Right-sided chest pain. EXAM: CHEST  2 VIEW COMPARISON:  06/13/2016 FINDINGS: Cardiomediastinal silhouette is normal. Mediastinal contours appear intact. There is no evidence of pleural effusion or pneumothorax. Low lung volumes. Subtle peribronchial airspace consolidation in bilateral lung bases. Osseous structures are without acute abnormality. Soft tissues are grossly normal. IMPRESSION: Subtle peribronchial airspace consolidation in bilateral lung bases may represent atelectasis, infectious consolidation or aspiration. Electronically Signed   By: Ted Mcalpine M.D.   On: 12/24/2016 23:54   Ct Angio Chest Pe W And/or Wo Contrast  Result Date: 12/25/2016 CLINICAL DATA:  Acute onset of right-sided  chest pain. Initial encounter. EXAM: CT ANGIOGRAPHY CHEST WITH CONTRAST TECHNIQUE: Multidetector CT imaging of the chest was performed using the standard protocol during bolus administration of intravenous contrast. Multiplanar CT image reconstructions and MIPs were obtained to evaluate the vascular anatomy. CONTRAST:  75 mL of Isovue 370 IV contrast COMPARISON:  Chest radiograph performed 12/24/2016, and CTA of the chest performed 02/01/2016 FINDINGS: Cardiovascular: There is pulmonary embolus to all lobes of both lungs, including at the right main pulmonary artery. The RV/LV ratio of 1.2 is compatible with right heart strain and  at least submassive pulmonary embolus. The heart is normal in size. The thoracic aorta is unremarkable. The great vessels are within normal limits. Mediastinum/Nodes: A mildly enlarged subcarinal node measures 1.2 cm in short axis. No additional mediastinal lymphadenopathy is seen. No pericardial effusion is identified. The visualized portions of the thyroid gland are unremarkable. No axillary lymphadenopathy is seen. Lungs/Pleura: Mild bibasilar atelectasis is noted. The lungs are otherwise clear. There is no evidence of pulmonary infarct at this time. No pleural effusion or pneumothorax is seen. No masses are identified. Upper Abdomen: The visualized portions of the liver and spleen are grossly unremarkable. There is reflux of contrast into the hepatic veins and IVC. The pancreas is not well assessed without contrast. The visualized portions of the adrenal glands and kidneys are grossly unremarkable. Musculoskeletal: No acute osseous abnormalities are identified. The visualized musculature is unremarkable in appearance. Review of the MIP images confirms the above findings. IMPRESSION: 1. Pulmonary embolus to all lobes of both lungs, including at the right main pulmonary artery. CT evidence of right heart strain (RV/LV Ratio = 1.2) consistent with at least submassive (intermediate risk)  PE. The presence of right heart strain has been associated with an increased risk of morbidity and mortality. Please activate Code PE [phone]. 2. Mildly enlarged 1.2 cm subcarinal node noted. 3. Mild bibasilar atelectasis.  Lungs otherwise clear. Critical Value/emergent results were called by telephone at the time of interpretation on 12/25/2016 at 6:47 am to Dr. Cy Blamer, who verbally acknowledged these results. Electronically Signed   By: Roanna Raider M.D.   On: 12/25/2016 06:51    Procedures Procedures (including critical care time)  Medications Ordered in ED Medications  heparin bolus via infusion 4,000 Units (not administered)  heparin ADULT infusion 100 units/mL (25000 units/237mL sodium chloride 0.45%) (not administered)  iopamidol (ISOVUE-370) 76 % injection (100 mLs  Contrast Given 12/25/16 0559)    MDM Reviewed: previous chart, nursing note and vitals Reviewed previous: ECG Interpretation: labs, ECG, x-ray and CT scan (CXR normal by me normal troponin CT with PE by me) Total time providing critical care: 75-105 minutes. This excludes time spent performing separately reportable procedures and services. Consults: admitting MD and critical care (no indication for TPA or admit to ICU by PCCM )  CRITICAL CARE Performed by: Jasmine Awe Total critical care time:90 minutes Critical care time was exclusive of separately billable procedures and treating other patients. Critical care was necessary to treat or prevent imminent or life-threatening deterioration. Critical care was time spent personally by me on the following activities: development of treatment plan with patient and/or surrogate as well as nursing, discussions with consultants, evaluation of patient's response to treatment, examination of patient, obtaining history from patient or surrogate, ordering and performing treatments and interventions, ordering and review of laboratory studies, ordering  and review of radiographic studies, pulse oximetry and re-evaluation of patient's condition.  Final Clinical Impressions(s) / ED Diagnoses  PE: Admit to medicine   Wendelyn Kiesling, MD 12/25/16 [phone]

## 2016-12-25 NOTE — ED Notes (Signed)
Patient transported to CT 

## 2016-12-26 ENCOUNTER — Other Ambulatory Visit: Payer: Self-pay

## 2016-12-26 LAB — CBC
HCT: 36.3 % — ABNORMAL LOW (ref 39.0–52.0)
Hemoglobin: 11.5 g/dL — ABNORMAL LOW (ref 13.0–17.0)
MCH: 25.5 pg — ABNORMAL LOW (ref 26.0–34.0)
MCHC: 31.7 g/dL (ref 30.0–36.0)
MCV: 80.5 fL (ref 78.0–100.0)
Platelets: 177 10*3/uL (ref 150–400)
RBC: 4.51 MIL/uL (ref 4.22–5.81)
RDW: 14.4 % (ref 11.5–15.5)
WBC: 5.1 10*3/uL (ref 4.0–10.5)

## 2016-12-26 LAB — BASIC METABOLIC PANEL
Anion gap: 4 — ABNORMAL LOW (ref 5–15)
BUN: 17 mg/dL (ref 6–20)
CO2: 25 mmol/L (ref 22–32)
Calcium: 8.3 mg/dL — ABNORMAL LOW (ref 8.9–10.3)
Chloride: 110 mmol/L (ref 101–111)
Creatinine, Ser: 1.23 mg/dL (ref 0.61–1.24)
GFR calc Af Amer: >60 mL/min (ref >60)
GFR calc non Af Amer: >60 mL/min (ref >60)
Glucose, Bld: 90 mg/dL (ref 65–99)
Potassium: 4.3 mmol/L (ref 3.5–5.1)
Sodium: 139 mmol/L (ref 135–145)

## 2016-12-26 LAB — HEPARIN LEVEL (UNFRACTIONATED)
Heparin Unfractionated: 0.39 IU/mL (ref 0.30–0.70)
Heparin Unfractionated: 0.31 IU/mL (ref 0.30–0.70)

## 2016-12-26 MED ORDER — SENNOSIDES-DOCUSATE SODIUM 8.6-50 MG PO TABS: 1.0000 | ORAL_TABLET | Freq: Every day | ORAL | Status: DC

## 2016-12-26 MED ORDER — SENNOSIDES-DOCUSATE SODIUM 8.6-50 MG PO TABS
1.0000 | ORAL_TABLET | Freq: Two times a day (BID) | ORAL | Status: DC
  Administered 2016-12-27: 1 via ORAL
  Filled 2016-12-26: qty 1

## 2016-12-26 MED ORDER — POLYETHYLENE GLYCOL 3350 17 G PO PACK
17.0000 g | PACK | Freq: Every day | ORAL | Status: DC
  Administered 2016-12-26 – 2016-12-27 (×2): 17 g via ORAL
  Filled 2016-12-26 (×2): qty 1

## 2016-12-26 NOTE — Progress Notes (Signed)
ANTICOAGULATION CONSULT NOTE - Follow Up Consult  Pharmacy Consult for Heparin  Indication: pulmonary embolus  Allergies  Allergen Reactions  . Lactose Intolerance (Gi) Diarrhea and Other (See Comments)    Causes acne, diarrhea and constipation  . Pork-Derived Products Nausea And Vomiting    Patient Measurements: Height:  (167.6 cm) Weight: 157 lb 8 oz (71.4 kg) IBW/kg (Calculated) : 63.8  Vital Signs: Temp: 98 F (36.7 C) (10/14 1226) Temp Source: Oral (10/14 1226) BP: 122/69 (10/14 1226) Pulse Rate: 50 (10/14 1226)  Labs:  Recent Labs  12/24/16 2301  12/25/16 2050 12/26/16 0341 12/26/16 1527  HGB 11.5*  --   --  11.5*  --   HCT 36.1*  --   --  36.3*  --   PLT 177  --   --  177  --   HEPARINUNFRC  --   < > 0.25* 0.39 0.31  CREATININE 1.24  --   --  1.23  --   < > = values in this interval not displayed.  Estimated Creatinine Clearance: 62.7 mL/min (by C-G formula based on SCr of 1.23 mg/dL).   Assessment: 51 y/o M on heparin for new PE. Heparin level remains therapeutic at 0.31 but is at the bottom of the goal range. No bleeding noted.   Goal of Therapy:  Heparin level 0.3-0.7 units/ml Monitor platelets by anticoagulation protocol: Yes   Plan:  Increase heparin gtt to 1400 units/hr to maintain in therapeutic range F/u AM heparin level Daily heparin level and CBC  Cing , Drake Leach 12/26/2016,4:49 PM

## 2016-12-26 NOTE — Progress Notes (Signed)
Subjective: Patient does not complain of shortness of breath today, he is still having chest pains that are pleuritic.  He denies any dizziness or lightheadedness, nausea or vomiting.  We discussed extensively about the importance of him being compliant with his anticoagulation medicine.    Objective:  Vital signs in last 24 hours: Vitals:   12/26/16 0015 12/26/16 0518 12/26/16 0833 12/26/16 1226  BP: 116/73 112/67 113/69 122/69  Pulse: (!) 55 (!) 44 (!) 51 (!) 50  Resp: Temp: 97.7 F (36.5 C) 97.7 F (36.5 C) 97.7 F (36.5 C) 98 F (36.7 C)  TempSrc: Oral Oral Oral Oral  SpO2: 98% 98% 100% 98%  Weight:  157 lb 8 oz (71.4 kg)    Height:       Physical Exam  Constitutional: He is oriented to person, place, and time. He appears well-developed and well-nourished.  Eyes: Right eye exhibits no discharge. Left eye exhibits no discharge. No scleral icterus.  Neck: JVD (1/2 cm above clavicle) present.  Cardiovascular: Regular rhythm, normal heart sounds and intact distal pulses.  Bradycardia present.  PMI is displaced (to right).  Exam reveals no gallop and no friction rub.   No murmur heard. Respiratory: Effort normal and breath sounds normal. No respiratory distress. He has no wheezes. He has no rales.  GI: Soft. Bowel sounds are normal. He exhibits no distension and no mass. There is no tenderness. There is no guarding.  Neurological: He is alert and oriented to person, place, and time.     Physical Exam  Constitutional: He is oriented to person, place, and time. He appears well-developed and well-nourished.  Eyes: Right eye exhibits no discharge. Left eye exhibits no discharge. No scleral icterus.  Neck: JVD (1/2 cm above clavicle) present.  Cardiovascular: Regular rhythm, normal heart sounds and intact distal pulses.  Bradycardia present.  PMI is displaced (to right).  Exam reveals no gallop and no friction rub.   No murmur heard. Pulmonary/Chest: Effort normal and  breath sounds normal. No respiratory distress. He has no wheezes. He has no rales.  Abdominal: Soft. Bowel sounds are normal. He exhibits no distension and no mass. There is no tenderness. There is no guarding.  Neurological: He is alert and oriented to person, place, and time.   Assessment/Plan:   Active Problems:   Pulmonary emboli (HCC)   Acute pulmonary embolism (HCC)   Pulmonary Embolism: Patient presenting with shortness of breath and chest pain/back pain found to have multiple pulmonary emboli to all lobes of bilateral lungs with right heart strain.  Code PE was called, pulmonary/critical care evaluated patient and did not feel further intervention was warranted at this time other than the heparin drip.  -this morning pt felt good about treating outpatient but later this afternoon became nervous about leaving today due to his pleuritic chest pain -continue IV heparin -Echo from yesterday returned as normal EF and no regional wall motion abnormalities, mild mitral regurgitation, mildly reduced systolic function of the right ventricle moderately dilated right atrium and a PA peak pressure of 52 mmHg suggestive of moderate pulm htn -care management consulted to help pt get one month supply of either xarelto or apixaban.  Very important that plan is in place to help pt get this medicine long term.  VTE ppx: pt on heparin drip   Dispo: Anticipated discharge in approximately 1 day(s).   Angelita Ingles, MD 12/26/2016, 12:31 PM Thornell Mule MD PGY-1 Internal Medicine Pager #  [phone] 

## 2016-12-26 NOTE — Progress Notes (Signed)
ANTICOAGULATION CONSULT NOTE - Follow Up Consult  Pharmacy Consult for Heparin  Indication: pulmonary embolus  Allergies  Allergen Reactions  . Lactose Intolerance (Gi) Diarrhea and Other (See Comments)    Causes acne, diarrhea and constipation  . Pork-Derived Products Nausea And Vomiting    Patient Measurements: Height:  (167.6 cm) Weight: 150 lb (68 kg) IBW/kg (Calculated) : 63.8  Vital Signs: Temp: 97.7 F (36.5 C) (10/14 0015) Temp Source: Oral (10/14 0015) BP: 116/73 (10/14 0015) Pulse Rate: 55 (10/14 0015)  Labs:  Recent Labs  12/24/16 2301 12/25/16 1458 12/25/16 2050 12/26/16 0341  HGB 11.5*  --   --  11.5*  HCT 36.1*  --   --  36.3*  PLT 177  --   --  177  HEPARINUNFRC  --  0.32 0.25* 0.39  CREATININE 1.24  --   --  1.23    Estimated Creatinine Clearance: 62.7 mL/min (by C-G formula based on SCr of 1.23 mg/dL).   Assessment: 51 y/o M on heparin for new PE, heparin level is therapeutic x 1 on heparin at 1350 units/hr  Goal of Therapy:  Heparin level 0.3-0.7 units/ml Monitor platelets by anticoagulation protocol: Yes   Plan:  -Cont heparin 1350 units/hr -1200 HL  Khaliel Morey 12/26/2016,4:37 AM

## 2016-12-27 LAB — CBC
HCT: 34.2 % — ABNORMAL LOW (ref 39.0–52.0)
Hemoglobin: 10.9 g/dL — ABNORMAL LOW (ref 13.0–17.0)
MCH: 25.5 pg — ABNORMAL LOW (ref 26.0–34.0)
MCHC: 31.9 g/dL (ref 30.0–36.0)
MCV: 80.1 fL (ref 78.0–100.0)
Platelets: 192 10*3/uL (ref 150–400)
RBC: 4.27 MIL/uL (ref 4.22–5.81)
RDW: 14.9 % (ref 11.5–15.5)
WBC: 5.6 10*3/uL (ref 4.0–10.5)

## 2016-12-27 LAB — HEPARIN LEVEL (UNFRACTIONATED): Heparin Unfractionated: 0.42 IU/mL (ref 0.30–0.70)

## 2016-12-27 MED ORDER — RIVAROXABAN 20 MG PO TABS: 20.0000 mg | ORAL_TABLET | Freq: Every day | ORAL | 0 refills | Status: DC

## 2016-12-27 MED ORDER — RIVAROXABAN 15 MG PO TABS
15.0000 mg | ORAL_TABLET | ORAL | 0 refills | Status: DC
Start: 2016-12-27 — End: 2016-12-27

## 2016-12-27 MED ORDER — IBUPROFEN 800 MG PO TABS: 800.0000 mg | ORAL_TABLET | Freq: Three times a day (TID) | ORAL | 0 refills | Status: AC | PRN

## 2016-12-27 MED ORDER — ACETAMINOPHEN 325 MG PO TABS: 650.0000 mg | ORAL_TABLET | Freq: Four times a day (QID) | ORAL | 0 refills | Status: AC | PRN

## 2016-12-27 MED ORDER — RIVAROXABAN 20 MG PO TABS: 20.0000 mg | ORAL_TABLET | Freq: Every day | ORAL | Status: DC

## 2016-12-27 MED ORDER — RIVAROXABAN 15 MG PO TABS: 15.0000 mg | ORAL_TABLET | Freq: Two times a day (BID) | ORAL | Status: DC

## 2016-12-27 MED ORDER — OXYCODONE HCL 5 MG PO TABS: 5.0000 mg | ORAL_TABLET | Freq: Four times a day (QID) | ORAL | 0 refills | Status: AC | PRN

## 2016-12-27 MED ORDER — RIVAROXABAN 15 MG PO TABS
15.0000 mg | ORAL_TABLET | ORAL | Status: AC
  Administered 2016-12-27: 15 mg via ORAL
  Filled 2016-12-27: qty 1

## 2016-12-27 NOTE — Discharge Instructions (Signed)
Information on my medicine - XARELTO (rivaroxaban)  This medication education was reviewed with me or my healthcare representative as part of my discharge preparation.  Reviewed by hospital pharmacist.   WHY WAS Peter Washington PRESCRIBED FOR YOU? Xarelto was prescribed to treat blood clots that may have been found in the veins of your legs (deep vein thrombosis) or in your lungs (pulmonary embolism) and to reduce the risk of them occurring again.  What do you need to know about Xarelto? The starting dose is one 15 mg tablet taken TWICE daily with food for the FIRST 21 DAYS then on 01/17/17  the dose is changed to one 20 mg tablet taken ONCE A DAY with your evening meal.  DO NOT stop taking Xarelto without talking to the health care provider who prescribed the medication.  Refill your prescription for 20 mg tablets before you run out.  After discharge, you should have regular check-up appointments with your healthcare provider that is prescribing your Xarelto.  In the future your dose may need to be changed if your kidney function changes by a significant amount.  What do you do if you miss a dose? If you are taking Xarelto TWICE DAILY and you miss a dose, take it as soon as you remember. You may take two 15 mg tablets (total 30 mg) at the same time then resume your regularly scheduled 15 mg twice daily the next day.  If you are taking Xarelto ONCE DAILY and you miss a dose, take it as soon as you remember on the same day then continue your regularly scheduled once daily regimen the next day. Do not take two doses of Xarelto at the same time.   Important Safety Information Xarelto is a blood thinner medicine that can cause bleeding. You should call your healthcare provider right away if you experience any of the following: ? Bleeding from an injury or your nose that does not stop. ? Unusual colored urine (red or dark brown) or unusual colored stools (red or black). ? Unusual bruising for  unknown reasons. ? A serious fall or if you hit your head (even if there is no bleeding).  Some medicines may interact with Xarelto and might increase your risk of bleeding while on Xarelto. To help avoid this, consult your healthcare provider or pharmacist prior to using any new prescription or non-prescription medications, including herbals, vitamins, non-steroidal anti-inflammatory drugs (NSAIDs) and supplements.  This website has more information on Xarelto: VisitDestination.com.br.

## 2016-12-27 NOTE — Care Management Note (Signed)
Case Management Note  Patient Details  Name: Peter Washington MRN: [id number] Date of Birth: February 13, 1966  Subjective/Objective:                    Action/Plan:  Patient has used 30 day free card for [place] 2 years ago. Patient not sure if he has used 30 day free card for Eliquis , if he has it's been less than 1 year.   Dr Shan Levans aware.Per Dr Shan Levans pharmacist with [facility] providing 30 day free [employer].  [place] letter provided and explained. Expected Discharge Date:                  Expected Discharge Plan:  Home/Self Care  In-House Referral:     Discharge planning Services  CM Consult  Post Acute Care Choice:    Choice offered to:  Patient  DME Arranged:    DME Agency:     HH Arranged:    [place] Agency:     Status of Service:  Completed, signed off  If discussed at [employer] of Stay Meetings, dates discussed:    Additional Comments:  Marilu Favre, RN 12/27/2016, 10:57 AM

## 2016-12-27 NOTE — Progress Notes (Signed)
ANTICOAGULATION CONSULT NOTE - Follow Up Consult  Pharmacy Consult for Heparin  Indication: acute  pulmonary embolus;   Pt has h/o recurrent PEs/DVTs  Allergies  Allergen Reactions  . Lactose Intolerance (Gi) Diarrhea and Other (See Comments)    Causes acne, diarrhea and constipation  . Pork-Derived Products Nausea And Vomiting    Patient Measurements: Height: _0  (167.6 cm) Weight: 157 lb 3.2 oz (71.3 kg) IBW/kg (Calculated) : 63.8  Vital Signs: Temp: 99.3 F (37.4 C) (10/15 0509) Temp Source: Oral (10/15 0509) BP: 115/65 (10/15 0509) Pulse Rate: 49 (10/15 0509)  Labs:  Recent Labs  12/24/16 2301  12/26/16 0341 12/26/16 1527 12/27/16 0321  HGB 11.5*  --  11.5*  --  10.9*  HCT 36.1*  --  36.3*  --  34.2*  PLT 177  --  177  --  192  HEPARINUNFRC  --   < > 0.39 0.31 0.42  CREATININE 1.24  --  1.23  --   --   < > = values in this interval not displayed.  Estimated Creatinine Clearance: 62.7 mL/min (by C-G formula based on SCr of 1.23 mg/dL).   Assessment: 51 y/o M on heparin for new PE. Heparin level remains therapeutic at 0.42 on heparin drip 1400 units/hr.  No bleeding noted.   Hgb 11.5, plts wnl.>>hgb slight decreased to 10.9, pltc remains wnl.   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 Dr. Beryle Beams noted that the patient has been noncompliant with his anticoagulation. He had been on Eliquis, Xarelto over a year ago. 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 Studies repeated, confirmed 01/2014.   MD notes pt has issues of inability to pay for anticoagulation and social issues as well including intermittent substance abuse with cocaine. RN reported this AM that patient told her that he took Eliquis for 6 months as instructed then stopped.  Dr. Beryle Beams has counseled the patient that he will continue to have recurrent clots if he does not take a blood thinner on a  chronic basis. Case Manager notes Per Dr Shan Levans that pharmacist with [facility] providing 30 day free Xarolto starter kit.   Goal of Therapy:  Heparin level 0.3-0.7 units/ml Monitor platelets by anticoagulation protocol: Yes   Plan:  Continue IV heparin gtt 1400 units/hr to maintain in therapeutic range Daily heparin level and CBC F/u for transition to Xarelto per ITMS plans   Thank you for allowing pharmacy to be part of this patients care team.  Nicole Cella, [place] Clinical Pharmacist Pager: [phone] [phone] phone [phone] or [place] 12/27/2016,12:09 PM

## 2016-12-27 NOTE — Progress Notes (Signed)
Subjective: Patient does not complain of shortness of breath today, he is still having chest pains that are pleuritic.  He reports some mild intermittent dizziness and left arm numbness.  We discussed extensively about the importance of him being compliant with his anticoagulation medicine and his protein c deficiency.  He is willing to follow up in our clinic.    Objective:  Vital signs in last 24 hours: Vitals:   12/26/16 1226 12/26/16 2003 12/27/16 0056 12/27/16 0509  BP: 122/69 133/72 118/71 115/65  Pulse: (!) 50 (!) 58 (!) 50 (!) 49  Resp: 18 18    Temp: 98 F (36.7 C) 98.2 F (36.8 C) 98.6 F (37 C) 99.3 F (37.4 C)  TempSrc: Oral Oral Oral Oral  SpO2: 98% 98% 100% 97%  Weight:    157 lb 3.2 oz (71.3 kg)  Height:       Physical Exam  Constitutional: He is oriented to person, place, and time. He appears well-developed and well-nourished.  Eyes: Right eye exhibits no discharge. Left eye exhibits no discharge. No scleral icterus.  Neck: JVD (1/2 cm above clavicle) present.  Cardiovascular: Regular rhythm, normal heart sounds and intact distal pulses.  Bradycardia present.  PMI is displaced (to right).  Exam reveals no gallop and no friction rub.   No murmur heard. Respiratory: Effort normal and breath sounds normal. No respiratory distress. He has no wheezes. He has no rales.  GI: Soft. Bowel sounds are normal. He exhibits no distension and no mass. There is no tenderness. There is no guarding.  Neurological: He is alert and oriented to person, place, and time.     Physical Exam  Constitutional: He is oriented to person, place, and time. He appears well-developed and well-nourished.  Eyes: Right eye exhibits no discharge. Left eye exhibits no discharge. No scleral icterus.  Neck: JVD (1/2 cm above clavicle) present.  Cardiovascular: Regular rhythm, normal heart sounds and intact distal pulses.  Bradycardia present.  PMI is displaced (to right).  Exam reveals no gallop and no  friction rub.   No murmur heard. Pulmonary/Chest: Effort normal and breath sounds normal. No respiratory distress. He has no wheezes. He has no rales.  Abdominal: Soft. Bowel sounds are normal. He exhibits no distension and no mass. There is no tenderness. There is no guarding.  Neurological: He is alert and oriented to person, place, and time.   Assessment/Plan:   Active Problems:   Pulmonary emboli (HCC)   Acute pulmonary embolism (HCC)   Pulmonary Embolism: Patient presented with shortness of breath and chest pain/back pain found to have multiple pulmonary emboli to all lobes of bilateral lungs with right heart strain.  Code PE was called, pulmonary/critical care evaluated patient and did not feel further intervention was warranted at this time other than the heparin drip.  -this morning pt felt good about treating outpatient and following up in clinic -continue IV heparin -Echo returned as normal EF and no regional wall motion abnormalities, mild mitral regurgitation, mildly reduced systolic function of the right ventricle moderately dilated right atrium and a PA peak pressure of 52 mmHg suggestive of moderate pulm htn -will need long term anticoagulation -care management consulted to help pt get one month supply of either xarelto or apixaban.  Very important that plan is in place to help pt get this medicine long term.  He will follow up in clinic for further medication assistance.  VTE ppx: pt on heparin drip   Dispo: Anticipated discharge in  approximately 1 day(s).   Angelita Ingles, MD 12/27/2016, 9:23 AM Thornell Mule MD PGY-1 Internal Medicine Pager # [phone]

## 2016-12-27 NOTE — Discharge Summary (Signed)
Name: Peter [place] MRN: [id number] DOB: 01-23-1966 51 y.o. PCP: Lavinia Sharps, NP  Date of Admission: 12/25/2016  4:15 AM Date of Discharge: 12/28/2016 Attending Physician: Gust Rung, DO  Discharge Diagnosis: 1.  Acute Pulmonary Embolism Protein C deficiency Principal Problem:   Acute pulmonary embolism (HCC) Active Problems:   Protein C deficiency [place])   Discharge Medications: Allergies as of 12/27/2016      Reactions   Lactose Intolerance (gi) Diarrhea, Other (See Comments)   Causes acne, diarrhea and constipation   Pork-derived Products Nausea And Vomiting      Medication List    TAKE these medications   acetaminophen 325 MG tablet Commonly known as:  TYLENOL Take 2 tablets (650 mg total) by mouth every 6 (six) hours as needed for mild pain (or Fever >/= 101).   ibuprofen 800 MG tablet Commonly known as:  ADVIL,MOTRIN Take 1 tablet (800 mg total) by mouth every 8 (eight) hours as needed.   oxyCODONE 5 MG immediate release tablet Commonly known as:  Oxy IR/ROXICODONE Take 1 tablet (5 mg total) by mouth every 6 (six) hours as needed for severe pain.   Rivaroxaban 15 MG Tabs tablet Commonly known as:  XARELTO Take 1 tablet (15 mg total) by mouth 2 (two) times daily with a meal.   rivaroxaban 20 MG Tabs tablet Commonly known as:  XARELTO Take 1 tablet (20 mg total) by mouth daily with supper. Start taking on:  01/17/2017       Disposition and follow-up:   Peter [place] was discharged from [facility] in Stable condition.  At the [place] follow up visit please address:  1.    Acute Pulmonary Embolism -Follow up with the patient and ensure he is taking his xarelto and that he was approved for the medical assistance program that will get him free xarelto for the rest of the year -Assess pts pleuritic chest pain and ensure it is beginning to resolve.   -make sure pt not having any episodes of dizziness or syncope at home -consider  repeat ECG in clinic to compare for any signs of right heart strain -examine pt and see if PMI displacement has resolved.   -Pt did have a mild elevation in creatinine, will be important to repeat BMP   Protein C deficiency -discussed with pt this disorder, ensure pt understands the risk this disorder puts him at for clot development and ensure he continues to take his xarelto.    Microcytic Anemia -consider further irons studies and iron supplementation if necessary  2.  Labs / imaging needed at time of follow-up: CBC, BMP, EKG, iron studies  3.  Pending labs/ test needing follow-up: none  Follow-up Appointments: Follow-up Information    [facility] Follow up in 4 day(s).   Why:  Pt has appointment scheduled for October 19th at 2:15PM Contact information: [address] [name] [address] [phone]          [place] Course by problem list: Principal Problem:   Acute pulmonary embolism (HCC) Active Problems:   Protein C deficiency (HCC)   1.  Acute Pulmonary Embolism: Pt presented to the ED with chest pain and shortness of breath.  Patient said he noticed some dizziness and tachycardia earlier in the week. Then on Friday morning, he began to have a sharp pain and pressure in the right side of his chest and back.  The pain was described as sharp with an associated  pressure it was worse with taking deep breaths.  The pain was non-radiating and there were no alleviating factors.  Patient has extensive DVT and PE history and was diagnosed with protein C deficiency.  He has been on xarelto and off in the past and more recently was given Some apixiban by the health department.  However he has not been taking any anticoagulant medication since March of this year.  He attributes this to cost and the hassle of getting these medicines through free programs since he does not have insurance.  Patient arrived short of breath, bradycardic, patient had 2  negative i-STAT troponins, chest x-ray that showed some possible bibasilar minimal atelectasis, EKG with sinus brady and nonspecific ST changes,  CT angio performed which demonstrated pulmonary embolus to all lobes of both lungs including at the right main pulmonary artery  and the RV/LV ratio of 1.2 was compatible with right heart strain and at least submassive pulmonary embolus.  A code PE was called, critical care evaluated the patient and decided to just watch for now, patient was started on heparin drip.  An ECHO was performed and returned as normal EF and no regional wall motion abnormalities, mild mitral regurgitation, mildly reduced systolic function of the right ventricle moderately dilated right atrium and a PA peak pressure of 52 mmHg suggestive of moderate pulm htn.  Patient's vitals remained stable there were no arrhythmias noted on continuous cardiac monitoring.  The patient continued to have some chest pain but felt comfortable being discharged on the xarelto with some pain medicine.  He was given a starter pack of xarelto one month supply and was scheduled to follow up in our clinic on Friday 12/31/16 at 2:15 pm.  We also applied for him to get a year of xarelto free through our clinic.  He was given clear instructions on what types of things to look out for that may require him to come back to the emergency department and this was clearly written in his discharge instructions.     Protein C deficiency Was unclear whether patient knew about this protein C deficiency.  It was important for [place] to counsel the patient on this disorder and just how important it is for him to be on an anticoagulant.  We feel he now understands the great importance of being on an anticoagulant and the potential liability of not taking this medication.    Discharge Vitals:   BP 115/65 (BP Location: Left Arm)   Pulse (!) 49   Temp 97.9 F (36.6 C) (Oral)   Resp 20   Ht  (1.676 m)   Wt 157 lb 3.2 oz (71.3 kg)    SpO2 97%   BMI 25.37 kg/m   Pertinent Labs, Studies, and Procedures:   CBC Latest Ref Rng & Units 12/27/2016 12/26/2016 12/24/2016  WBC 4.0 - 10.5 K/uL 5.6 5.1 5.9  Hemoglobin 13.0 - 17.0 g/dL 10.9(L) 11.5(L) 11.5(L)  Hematocrit 39.0 - 52.0 % 34.2(L) 36.3(L) 36.1(L)  Platelets 150 - 400 K/uL 192 177 177   BMP Latest Ref Rng & Units 12/26/2016 12/24/2016 06/13/2016  Glucose 65 - 99 mg/dL 90 92 86  BUN 6 - 20 mg/dL Creatinine 0.61 - 1.24 mg/dL 0.98 1.19 1.47  Sodium 135 - 145 mmol/L 139 138 143  Potassium 3.5 - 5.1 mmol/L 4.3 3.7 3.6  Chloride 101 - 111 mmol/L 110 105 108  CO2 22 - 32 mmol/L 25 27 -  Calcium 8.9 - 10.3  mg/dL 8.3(L) 8.7(L) -    CTA chest  IMPRESSION: 1. Pulmonary embolus to all lobes of both lungs, including at the right main pulmonary artery. CT evidence of right heart strain (RV/LV Ratio = 1.2) consistent with at least submassive (intermediate risk) PE. The presence of right heart strain has been associated with an increased risk of morbidity and mortality. Please activate Code PE by paging [phone]. 2. Mildly enlarged 1.2 cm subcarinal node noted. 3. Mild bibasilar atelectasis.  Lungs otherwise clear.   Critical Value/emergent results were called by telephone at the time of interpretation on 12/25/2016 at 6:47 am to Dr. Cy Blamer, who verbally acknowledged these results.   ECHO complete Study Conclusions   - Left ventricle: The cavity size was normal. Systolic function was   normal. The estimated ejection fraction was in the range of 60%   to 65%. Wall motion was normal; there were no regional wall   motion abnormalities. - Aortic valve: Trileaflet; mildly thickened, mildly calcified   leaflets. - Mitral valve: There was mild regurgitation. - Right ventricle: Systolic function was mildly reduced. - Right atrium: The atrium was moderately dilated. - Pulmonary arteries: PA peak pressure: 52 mm Hg (S).    Discharge  Instructions: Discharge Instructions    Call MD for:    Complete by:  As directed    Call MD for:  difficulty breathing, headache or visual disturbances    Complete by:  As directed    Call MD for:  extreme fatigue    Complete by:  As directed    Call MD for:  hives    Complete by:  As directed    Call MD for:  persistant dizziness or light-headedness    Complete by:  As directed    Call MD for:  persistant nausea and vomiting    Complete by:  As directed    Call MD for:  redness, tenderness, or signs of infection (pain, swelling, redness, odor or green/yellow discharge around incision site)    Complete by:  As directed    Call MD for:  severe uncontrolled pain    Complete by:  As directed    Call MD for:  temperature >100.4    Complete by:  As directed    Diet - low sodium heart healthy    Complete by:  As directed    Discharge instructions    Complete by:  As directed    Peter [place], it was great to meet you.  You had multiple blood clots that blocked some of the vessels going to the lungs this admission.  You have an increased risk for clots due to being born with a deficiency in a protein called protein C.  This protein is a natural blood thinner our bodies make.  As a result it is very important for you to take the xarelto we gave you here and follow up in our clinic to continue to get your xarelto prescriptions and to establish care.  I have made an appointment for you in our clinic for Friday October 19th at 2:15PM.  They will be able to reevaluate your chest pain coming from your blood clots during that visit and make sure you have access to the xarelto.  In the mean time you are otherwise healthy.  The most important thing for you to do is follow up in clinic and continue to take your xarelto.  If you develop any new symptoms feeling like you're going to pass out, actually  passing out or develop new intense chest pain please seek treatment in the emergency department.  Otherwise expect  the same kind of pain you've been having here that will eventually resolve as your body begins to break down these clots with the help of the xarelto.   Increase activity slowly    Complete by:  As directed       Signed: Angelita Ingles, MD 12/28/2016, 12:54 PM

## 2016-12-31 ENCOUNTER — Ambulatory Visit: Payer: Self-pay

## 2017-01-01 ENCOUNTER — Emergency Department (HOSPITAL_COMMUNITY)
Admission: EM | Admit: 2017-01-01 | Discharge: 2017-01-03 | Disposition: A | Discharge status: Home / Self Care | Payer: Self-pay | Attending: Emergency Medicine | Admitting: Emergency Medicine

## 2017-01-01 ENCOUNTER — Encounter (HOSPITAL_COMMUNITY): Payer: Self-pay | Admitting: Emergency Medicine

## 2017-01-01 DIAGNOSIS — F17210 Nicotine dependence, cigarettes, uncomplicated: Secondary | ICD-10-CM | POA: Insufficient documentation

## 2017-01-01 DIAGNOSIS — F1414 Cocaine abuse with cocaine-induced mood disorder: Secondary | ICD-10-CM | POA: Insufficient documentation

## 2017-01-01 DIAGNOSIS — R45851 Suicidal ideations: Secondary | ICD-10-CM | POA: Insufficient documentation

## 2017-01-01 NOTE — ED Notes (Signed)
Pt has had a headache all day. Hx of concussions from boxing.

## 2017-01-01 NOTE — ED Notes (Signed)
Bed: WLPT3 Expected date:  Expected time:  Means of arrival:  Comments: 

## 2017-01-01 NOTE — ED Triage Notes (Addendum)
Pt reports having headache for the last few days. Pt also reports not taking psych medications for the last several months. Pt also reports feeling depressed and suicidal today. Pt denies any plan for SI.

## 2017-01-02 LAB — COMPREHENSIVE METABOLIC PANEL
ALT: 60 U/L (ref 17–63)
ANION GAP: 7 (ref 5–15)
AST: 63 U/L — ABNORMAL HIGH (ref 15–41)
Albumin: 3.8 g/dL (ref 3.5–5.0)
Alkaline Phosphatase: 64 U/L (ref 38–126)
BUN: 23 mg/dL — ABNORMAL HIGH (ref 6–20)
CALCIUM: 9.2 mg/dL (ref 8.9–10.3)
CHLORIDE: 108 mmol/L (ref 101–111)
CO2: 25 mmol/L (ref 22–32)
Creatinine, Ser: 1.27 mg/dL — ABNORMAL HIGH (ref 0.61–1.24)
GFR calc non Af Amer: >60 mL/min (ref >60)
Glucose, Bld: 85 mg/dL (ref 65–99)
POTASSIUM: 4.1 mmol/L (ref 3.5–5.1)
SODIUM: 140 mmol/L (ref 135–145)
Total Bilirubin: 0.6 mg/dL (ref 0.3–1.2)
Total Protein: 7.2 g/dL (ref 6.5–8.1)

## 2017-01-02 LAB — CBC
HCT: 35 % — ABNORMAL LOW (ref 39.0–52.0)
HEMOGLOBIN: 11.7 g/dL — AB (ref 13.0–17.0)
MCH: 26.4 pg (ref 26.0–34.0)
MCHC: 33.4 g/dL (ref 30.0–36.0)
MCV: 79 fL (ref 78.0–100.0)
Platelets: 251 10*3/uL (ref 150–400)
RBC: 4.43 MIL/uL (ref 4.22–5.81)
RDW: 14.6 % (ref 11.5–15.5)
WBC: 5.5 10*3/uL (ref 4.0–10.5)

## 2017-01-02 LAB — RAPID URINE DRUG SCREEN, HOSP PERFORMED
Amphetamines: NOT DETECTED
Barbiturates: NOT DETECTED
Benzodiazepines: NOT DETECTED
COCAINE: POSITIVE — AB
OPIATES: NOT DETECTED
TETRAHYDROCANNABINOL: NOT DETECTED

## 2017-01-02 LAB — ACETAMINOPHEN LEVEL

## 2017-01-02 LAB — SALICYLATE LEVEL

## 2017-01-02 LAB — ETHANOL: Alcohol, Ethyl (B): <10 mg/dL (ref <10)

## 2017-01-02 MED ORDER — ACETAMINOPHEN 325 MG PO TABS: 650.0000 mg | ORAL_TABLET | ORAL | Status: DC | PRN

## 2017-01-02 MED ORDER — ALUM & MAG HYDROXIDE-SIMETH 200-200-20 MG/5ML PO SUSP: 30.0000 mL | Freq: Four times a day (QID) | ORAL | Status: DC | PRN

## 2017-01-02 MED ORDER — RIVAROXABAN 15 MG PO TABS
15.0000 mg | ORAL_TABLET | Freq: Two times a day (BID) | ORAL | Status: DC
  Administered 2017-01-02 – 2017-01-03 (×3): 15 mg via ORAL
  Filled 2017-01-02 (×4): qty 1

## 2017-01-02 MED ORDER — ONDANSETRON HCL 4 MG PO TABS: 4.0000 mg | ORAL_TABLET | Freq: Three times a day (TID) | ORAL | Status: DC | PRN

## 2017-01-02 NOTE — ED Notes (Signed)
Pt A&O x 3, no distress noted, calm & cooperative. Pt watching TV at present.  Monitoring for safety, Q 15 min checks in effect. 

## 2017-01-02 NOTE — ED Notes (Signed)
Bed: WA21 Expected date:  Expected time:  Means of arrival:  Comments: 

## 2017-01-02 NOTE — ED Notes (Addendum)
Pt oriented to room and unit.  Pt denies S/I and H/I and contracts for safety.  Pt is pleasant and cooperative.  Pt was given Lunch tray and he explained that he had a pork allergy and was lactose intolerant.  A new tray was ordered and patient was still given mac and cheese.  I notified service response that this was not lactose free.   15 minute checks and video monitoring in place.

## 2017-01-02 NOTE — ED Provider Notes (Signed)
[facility]-EMERGENCY DEPT Provider Note   CSN: [id number] Arrival date & time: 01/01/17  2325     History   Chief Complaint Chief Complaint  Patient presents with  . Headache  . Suicidal    HPI Peter [place] is a 51 y.o. male.  The history is provided by the patient.  He complains of worsening depression over the last week.  He has had crying spells, early morning wakening, and anhedonia.  He has had suicidal thoughts without a clear suicidal plan.  He does endorse visual and auditory hallucinations including command hallucinations and persecutory hallucinations.  He states that voices have been telling him to hang himself.  He also admits to cocaine use with last use yesterday.  He is currently complaining of a bitemporal headache which is throbbing and he rates it at 4/10.  No visual change, nausea, vomiting, weakness, numbness.  Past Medical History:  Diagnosis Date  . DVT of lower extremity (deep venous thrombosis) (HCC)   . PE (pulmonary embolism)     Patient Active Problem List   Diagnosis Date Noted  . Acute pulmonary embolism (HCC) 12/25/2016  . Depression 02/02/2016  . DVT (deep venous thrombosis) (HCC) 02/02/2016  . Cocaine dependence, continuous (HCC) 01/19/2016  . 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 History of pulmonary embolus (PE) 01/16/2014  . Tobacco use 01/16/2014  . Protein C deficiency (HCC) 12/31/2013    History reviewed. No pertinent surgical history.     Home Medications    Prior to Admission medications   Medication Sig Start Date End Date Taking? Authorizing Provider  ibuprofen (ADVIL,MOTRIN) 800 MG tablet Take 1 tablet (800 mg total) by mouth every 8 (eight) hours as needed. 12/27/16 01/03/17 Yes Angelita Ingles, MD  Rivaroxaban (XARELTO) 15 MG TABS tablet Take 1 tablet (15 mg total) by  mouth 2 (two) times daily with a meal. 12/27/16  Yes Winfrey, Kimberlee Nearing, MD  rivaroxaban (XARELTO) 20 MG TABS tablet Take 1 tablet (20 mg total) by mouth daily with supper. 01/17/17   Angelita Ingles, MD    Family History Family History  Problem Relation Age of Onset  . Diabetes Neg Hx   . Stroke Neg Hx   . Cancer Neg Hx   . Clotting disorder Neg Hx     Social History Social History  Substance Use Topics  . Smoking status: Current Every Day Smoker    Packs/day: 0.25    Types: Cigarettes  . Smokeless tobacco: Never Used     Comment: PATIENT STATES HE DO NOT SMOKE/SEEM TO NOT KNOW WHEN  HE STOPPED  . Alcohol use Yes     Comment: 3 beers on weekends     Allergies   Lactose intolerance (gi) and Pork-derived products   Review of Systems Review of Systems  All other systems reviewed and are negative.    Physical Exam Updated Vital Signs BP 121/66 (BP Location: Right Arm)   Pulse 63   Temp 98.3 F (36.8 C) (Oral)   Resp 15   Ht 5\' 7"  (1.702 m)   Wt 68 kg (150 lb)   SpO2 97%   BMI 23.49 kg/m   Physical Exam  Nursing note and vitals reviewed.  51 year old male, resting comfortably and in no acute distress. Vital signs are normal. Oxygen saturation is 97%, which is normal. Head is normocephalic and atraumatic. PERRLA, EOMI. Oropharynx is  clear. Neck is nontender and supple without adenopathy or JVD. Back is nontender and there is no CVA tenderness. Lungs are clear without rales, wheezes, or rhonchi. Chest is nontender. Heart has regular rate and rhythm without murmur. Abdomen is soft, flat, nontender without masses or hepatosplenomegaly and peristalsis is normoactive. Extremities have no cyanosis or edema, full range of motion is present. Skin is warm and dry without rash. Neurologic: Mental status is normal, cranial nerves are intact, there are no motor or sensory deficits.  ED Treatments / Results  Labs (all labs ordered are listed, but only abnormal  results are displayed) Labs Reviewed  COMPREHENSIVE METABOLIC PANEL - Abnormal; Notable for the following:       Result Value   BUN 23 (*)    Creatinine, Ser 1.27 (*)    AST 63 (*)    All other components within normal limits  ACETAMINOPHEN LEVEL - Abnormal; Notable for the following:    Acetaminophen (Tylenol), Serum <10 (*)    All other components within normal limits  CBC - Abnormal; Notable for the following:    Hemoglobin 11.7 (*)    HCT 35.0 (*)    All other components within normal limits  ETHANOL  SALICYLATE LEVEL  RAPID URINE DRUG SCREEN, HOSP PERFORMED   Procedures Procedures (including critical care time)  Medications Ordered in ED Medications  Rivaroxaban (XARELTO) tablet 15 mg (not administered)  alum & mag hydroxide-simeth (MAALOX/MYLANTA) 200-200-20 MG/5ML suspension 30 mL (not administered)  ondansetron (ZOFRAN) tablet 4 mg (not administered)  acetaminophen (TYLENOL) tablet 650 mg (not administered)     Initial Impression / Assessment and Plan / ED Course  I have reviewed the triage vital signs and the nursing notes.  Pertinent lab results that were available during my care of the patient were reviewed by me and considered in my medical decision making (see chart for details).  Major depression with psychotic features and vague suicidal ideation.  Old records are reviewed confirming recent hospitalization with diagnosis of pulmonary embolism, and prior behavioral health hospitalization for depression.  He is placed in psychiatric holding pending TTS consultation.  TTS consultation is appreciated.  Patient will be held for psychiatric evaluation in the morning.  Final Clinical Impressions(s) / ED Diagnoses   Final diagnoses:  Major depression with psychotic features (HCC)  Suicidal ideation    New Prescriptions New Prescriptions   No medications on file     Dione BoozeGlick, Azrael Maddix, MD 01/02/17 [phone]

## 2017-01-02 NOTE — ED Notes (Addendum)
Writer asked patient if he could give a urine specimen and patient stated "I can when I feel like I can". Will notify RN.

## 2017-01-02 NOTE — BH Assessment (Signed)
[facility] Assessment Progress Note  TTS consulted with Nira ConnJason Berry, NP who recommends overnight observation for safety measures and reassessment by psych to determine criteria for inpt hospitalization. Coggin, Salvadore DomIlene K, RN notified of disposition. EDP Dione BoozeGlick, David, MD contacted and informed of recommendation and in agreement with plan of care.  Princess BruinsAquicha Dai Apel, MSW, LCSW Therapeutic Triage Specialist  [phone]

## 2017-01-02 NOTE — Progress Notes (Signed)
CSW following for disposition needs.   [facility]  [facility]  Stacy GardnerErin Jasmene Goswami, [place] Emergency Room Clinical Social Worker [phone]

## 2017-01-02 NOTE — ED Notes (Signed)
TTS called and advised pt is to be observed overnight and reevaluation in the morning by Psych.

## 2017-01-02 NOTE — ED Notes (Addendum)
Pt changed into paper scrubs and wanded by security. Pt given sandwich, crackers, peanut butter, and ginger ale. No other needs at this time. Pt belongings secured at nursing station and placed on bottom shelf.

## 2017-01-02 NOTE — BH Assessment (Addendum)
Assessment Note  Peter Washington is an 51 y.o. male who presents to the ED voluntarily due to increased depression and SI without a plan. Pt reports he has been experiencing severe headaches for more than 2 weeks which prompted him to come to the ED. While in the ED, the pt reported he also experiences SI and hopelessness. Pt states he has had SI in the past but denies that he has ever acted on his thoughts. Pt reports he has been feeling depressed for most of his life and states the symptoms worsened 2 weeks ago. Pt denies any specific trigger that led to his worsening depression and when asked pt stated "just stuff with my job and it just crossed my mind." Pt endorses VH and states he sees "people and animals." Pt reports the hallucinations occur daily and he will see the visual hallucination for about 10 minutes and it will disappear. Pt endorses frequent cocaine use and occasional alcohol use. Pt was asked to identify his current support system and he stated he has support from his entire family.  Pt presents with multiple risk factors for suicide including depression, other mental disorders including psychosis, substance abuse history, suicidal thoughts, living alone, and having no current mental health treatment provider.  Pt reports some protective factors including support from his family and willingness to seek treatment.  TTS consulted with Peter ConnJason Berry, NP who recommends overnight observation for safety measures and reassessment by psych to determine criteria for inpt hospitalization. Washington, Peter DomIlene K, RN notified of disposition. EDP Dione BoozeGlick, David, MD contacted and informed of recommendation and in agreement with plan of care.  Diagnosis: MDD, single episode, moderate w/ psychotic features; Cocaine Use D/O  Past Medical History:  Past Medical History:  Diagnosis Date  . DVT of lower extremity (deep venous thrombosis) (HCC)   . PE (pulmonary embolism)     History reviewed. No pertinent surgical  history.  Family History:  Family History  Problem Relation Age of Onset  . Diabetes Neg Hx   . Stroke Neg Hx   . Cancer Neg Hx   . Clotting disorder Neg Hx     Social History:  reports that he has been smoking Cigarettes.  He has been smoking about 0.25 packs per day. He has never used smokeless tobacco. He reports that he drinks alcohol. He reports that he does not use drugs.  Additional Social History:  Alcohol / Drug Use Pain Medications: See MAR Prescriptions: See MAR Over the Counter: See MAR History of alcohol / drug use?: Yes Longest period of sobriety (when/how long): unknown Substance #1 Name of Substance 1: Cocaine  1 - Age of First Use: 21 1 - Amount (size/oz): varies 1 - Frequency: daily 1 - Duration: ongoing 1 - Last Use / Amount: 12/31/16 Substance #2 Name of Substance 2: Alcohol 2 - Age of First Use: 14 2 - Amount (size/oz): 2 drinks 2 - Frequency: varies 2 - Duration: ongoing 2 - Last Use / Amount: 12/31/16  CIWA: CIWA-Ar BP: 121/66 Pulse Rate: 63 COWS:    Allergies:  Allergies  Allergen Reactions  . Lactose Intolerance (Gi) Diarrhea and Other (See Comments)    Causes acne, diarrhea and constipation  . Pork-Derived Products Nausea And Vomiting    Home Medications:  (Not in a hospital admission)  OB/GYN Status:  No LMP for male patient.  General Assessment Data Location of Assessment: [facility] TTS Assessment: In system Is this a Tele or Face-to-Face Assessment?: Face-to-Face Is this an  Initial Assessment or a Re-assessment for this encounter?: Initial Assessment Marital status: Single Is patient pregnant?: No Pregnancy Status: No Living Arrangements: Alone Can pt return to current living arrangement?: Yes Admission Status: Voluntary Is patient capable of signing voluntary admission?: Yes Referral Source: Self/Family/Friend Insurance type: none on file      Crisis Care Plan Living Arrangements: Alone Name of Psychiatrist: none Name  of Therapist: none  Education Status Is patient currently in school?: No Highest grade of school patient has completed: 12th  Risk to self with the past 6 months Suicidal Ideation: Yes-Currently Present Has patient been a risk to self within the past 6 months prior to admission? : No Suicidal Intent: No Has patient had any suicidal intent within the past 6 months prior to admission? : No Is patient at risk for suicide?: Yes Suicidal Plan?: No Has patient had any suicidal plan within the past 6 months prior to admission? : No Access to Means: No What has been your use of drugs/alcohol within the last 12 months?: reports to frequent cocaine use and occasional alcohol use  Previous Attempts/Gestures: No Triggers for Past Attempts: None known Intentional Self Injurious Behavior: None Family Suicide History: No Recent stressful life event(s): Financial Problems, Recent negative physical changes Persecutory voices/beliefs?: No Depression: Yes Depression Symptoms: Despondent, Insomnia, Tearfulness, Isolating, Fatigue, Guilt, Feeling worthless/self pity, Loss of interest in usual pleasures, Feeling angry/irritable Substance abuse history and/or treatment for substance abuse?: Yes Suicide prevention information given to non-admitted patients: Not applicable  Risk to Others within the past 6 months Homicidal Ideation: No Does patient have any lifetime risk of violence toward others beyond the six months prior to admission? : No Thoughts of Harm to Others: No Current Homicidal Intent: No Current Homicidal Plan: No Access to Homicidal Means: No History of harm to others?: No Assessment of Violence: None Noted Does patient have access to weapons?: No Criminal Charges Pending?: No Does patient have a court date: No Is patient on probation?: No  Psychosis Hallucinations: Auditory, Visual Delusions: None noted  Mental Status Report Appearance/Hygiene: Unremarkable, In scrubs Eye  Contact: Poor (pt has his eyes closed during the assessment ) Motor Activity: Freedom of movement Speech: Logical/coherent, Slow Level of Consciousness: Quiet/awake Mood: Depressed, Helpless Affect: Sad, Depressed Anxiety Level: None Thought Processes: Coherent, Relevant Judgement: Partial Orientation: Person, Place, Time, Situation, Appropriate for developmental age Obsessive Compulsive Thoughts/Behaviors: None  Cognitive Functioning Concentration: Normal Memory: Remote Intact, Recent Intact IQ: Average Insight: Fair Impulse Control: Fair Appetite: Good Sleep: Decreased Total Hours of Sleep: 2 Vegetative Symptoms: None  ADLScreening [facility] Assessment Services) Patient's cognitive ability adequate to safely complete daily activities?: Yes Patient able to express need for assistance with ADLs?: Yes Independently performs ADLs?: Yes (appropriate for developmental age)  Prior Inpatient Therapy Prior Inpatient Therapy: Yes Prior Therapy Dates: 2017, 2010 Prior Therapy Facilty/Provider(s): [facility] Reason for Treatment: SUBSTANCE ABUSE, MDD  Prior Outpatient Therapy Prior Outpatient Therapy: No Does patient have an ACCT team?: No Does patient have Intensive In-House Services?  : No Does patient have [facility] services? : No Does patient have P4CC services?: No  ADL Screening (condition at time of admission) Patient's cognitive ability adequate to safely complete daily activities?: Yes Is the patient deaf or have difficulty hearing?: No Does the patient have difficulty seeing, even when wearing glasses/contacts?: No Does the patient have difficulty concentrating, remembering, or making decisions?: No Patient able to express need for assistance with ADLs?: Yes Does the patient have difficulty dressing or bathing?:  No Independently performs ADLs?: Yes (appropriate for developmental age) Does the patient have difficulty walking or climbing stairs?: No Weakness of Legs: Left (pt  reports pain in left hip ) Weakness of Arms/Hands: None  Home Assistive Devices/Equipment Home Assistive Devices/Equipment: None    Abuse/Neglect Assessment (Assessment to be complete while patient is alone) Physical Abuse: Denies Verbal Abuse: Denies Sexual Abuse: Denies Exploitation of patient/patient's resources: Denies Self-Neglect: Denies     Merchant navy officer (For Healthcare) Does Patient Have a Medical Advance Directive?: No Would patient like information on creating a medical advance directive?: No - Patient declined    Additional Information 1:1 In Past 12 Months?: No CIRT Risk: No Elopement Risk: No Does patient have medical clearance?: Yes     Disposition:  Disposition Initial Assessment Completed for this Encounter: Yes Disposition of Patient: Re-evaluation by Psychiatry recommended (per Peter Conn, NP )  On Site Evaluation by:   Reviewed with Physician:    Karolee Ohs 01/02/2017 4:19 AM

## 2017-01-02 NOTE — ED Notes (Signed)
Psychiatry at bedside.

## 2017-01-03 DIAGNOSIS — F1414 Cocaine abuse with cocaine-induced mood disorder: Secondary | ICD-10-CM | POA: Diagnosis present

## 2017-01-03 DIAGNOSIS — R45850 Homicidal ideations: Secondary | ICD-10-CM

## 2017-01-03 DIAGNOSIS — I2699 Other pulmonary embolism without acute cor pulmonale: Secondary | ICD-10-CM

## 2017-01-03 DIAGNOSIS — F17210 Nicotine dependence, cigarettes, uncomplicated: Secondary | ICD-10-CM

## 2017-01-03 DIAGNOSIS — F1910 Other psychoactive substance abuse, uncomplicated: Secondary | ICD-10-CM

## 2017-01-03 DIAGNOSIS — R45851 Suicidal ideations: Secondary | ICD-10-CM

## 2017-01-03 MED ORDER — GABAPENTIN 100 MG PO CAPS: 200.0000 mg | ORAL_CAPSULE | Freq: Two times a day (BID) | ORAL | 0 refills | Status: DC

## 2017-01-03 MED ORDER — GABAPENTIN 100 MG PO CAPS
200.0000 mg | ORAL_CAPSULE | Freq: Two times a day (BID) | ORAL | Status: DC
  Administered 2017-01-03: 200 mg via ORAL
  Filled 2017-01-03: qty 2

## 2017-01-03 NOTE — ED Notes (Signed)
Pt is upset that he is being discharged.  [facility] and feels like that helped him.  I explained that the MD does not feel as if he requires inpatient services at this time.  I explained how out patient services are useful and necessary to get well.   Pt doesn't seem interested in outpatient services.  Pt requested to eat lunch before being discharged and as it is so close to discharge I will feed him first.

## 2017-01-03 NOTE — Consult Note (Signed)
[place] Psychiatry Consult   Reason for Consult:  Cocaine abuse with suicidal ideations/hallucinations Referring Physician:  EDP Patient Identification: Peter Washington MRN:  [id number] Principal Diagnosis: Cocaine abuse with cocaine-induced mood disorder [facility]) Diagnosis:   Patient Active Problem List   Diagnosis Date Noted  . Cocaine abuse with cocaine-induced mood disorder [facility]) [F14.14] 01/03/2017    Priority: High  . Acute pulmonary embolism ([place]) [I26.99] 12/25/2016  . Depression [F32.9] 02/02/2016  . DVT (deep venous thrombosis) ([place]) [I82.409] 02/02/2016  . Cocaine dependence, continuous ([place]) [F14.20] 01/19/2016  . Substance-induced psychotic disorder with hallucinations ([place]) [F19.951] 01/18/2016  . Major depressive disorder, recurrent severe without psychotic features ([place]) [F33.2] 01/12/2016  . History of pulmonary embolus (PE) [Z86.711] 01/16/2014  . Tobacco use [Z72.0] 01/16/2014  . Protein C deficiency ([place]) [D68.59] 12/31/2013    Total Time spent with patient: 45 minutes  Subjective:   Peter Washington is a 51 y.o. male patient does not warrant admission.  HPI:  51 yo male who came to the ED after using cocaine and having suicidal ideations with hallucinations.  He was at [facility] inpatient for medical issues last week, after discharge he started using cocaine and started having suicidal ideations with hallucinations.  He is now clear and coherent with no suicidal/homicidal ideations, hallucinations, or withdrawal symptoms.  Stable for discharge.  Past Psychiatric History: substance abuse  Risk to Self: None Risk to Others: Homicidal Ideation: No Thoughts of Harm to Others: No Current Homicidal Intent: No Current Homicidal Plan: No Access to Homicidal Means: No History of harm to others?: No Assessment of Violence: None Noted Does patient have access to weapons?: No Criminal Charges Pending?: No Does patient have a court date: No Prior Inpatient Therapy: Prior  Inpatient Therapy: Yes Prior Therapy Dates: 2017, 2010 Prior Therapy Facilty/Provider(s): [facility] Reason for Treatment: SUBSTANCE ABUSE, MDD Prior Outpatient Therapy: Prior Outpatient Therapy: No Does patient have an ACCT team?: No Does patient have Intensive In-House Services?  : No Does patient have [facility] services? : No Does patient have P4CC services?: No  Past Medical History:  Past Medical History:  Diagnosis Date  . DVT of lower extremity (deep venous thrombosis) ([place])   . PE (pulmonary embolism)    History reviewed. No pertinent surgical history. Family History:  Family History  Problem Relation Age of Onset  . Diabetes Neg Hx   . Stroke Neg Hx   . Cancer Neg Hx   . Clotting disorder Neg Hx    Family Psychiatric  History: none Social History:  History  Alcohol Use  . Yes    Comment: 3 beers on weekends     History  Drug Use No    Social History   Social History  . Marital status: Single    Spouse name: N/A  . Number of children: N/A  . Years of education: N/A   Social History Main Topics  . Smoking status: Current Every Day Smoker    Packs/day: 0.25    Types: Cigarettes  . Smokeless tobacco: Never Used     Comment: PATIENT STATES HE DO NOT SMOKE/SEEM TO NOT KNOW WHEN  HE STOPPED  . Alcohol use Yes     Comment: 3 beers on weekends  . Drug use: No  . Sexual activity: Not Asked   Other Topics Concern  . None   Social History Narrative  . None   Additional Social History:    Allergies:   Allergies  Allergen Reactions  . Lactose Intolerance (Gi) Diarrhea  and Other (See Comments)    Causes acne, diarrhea and constipation  . Pork-Derived Products Nausea And Vomiting    Labs:  Results for orders placed or performed during the hospital encounter of 01/01/17 (from the past 48 hour(s))  Rapid urine drug screen (hospital performed)     Status: Abnormal   Collection Time: 01/01/17 11:40 PM  Result Value Ref Range   Opiates NONE DETECTED NONE DETECTED    Cocaine POSITIVE (A) NONE DETECTED   Benzodiazepines NONE DETECTED NONE DETECTED   Amphetamines NONE DETECTED NONE DETECTED   Tetrahydrocannabinol NONE DETECTED NONE DETECTED   Barbiturates NONE DETECTED NONE DETECTED    Comment:        DRUG SCREEN FOR MEDICAL PURPOSES ONLY.  IF CONFIRMATION IS NEEDED FOR ANY PURPOSE, NOTIFY LAB WITHIN 5 DAYS.        LOWEST DETECTABLE LIMITS FOR URINE DRUG SCREEN Drug Class       Cutoff (ng/mL) Amphetamine      1000 Barbiturate      200 Benzodiazepine   756 Tricyclics       433 Opiates          300 Cocaine          300 THC              50   Comprehensive metabolic panel     Status: Abnormal   Collection Time: 01/01/17 11:59 PM  Result Value Ref Range   Sodium 140 135 - 145 mmol/L   Potassium 4.1 3.5 - 5.1 mmol/L   Chloride 108 101 - 111 mmol/L   CO2 25 22 - 32 mmol/L   Glucose, Bld 85 65 - 99 mg/dL   BUN 23 (H) 6 - 20 mg/dL   Creatinine, Ser 1.27 (H) 0.61 - 1.24 mg/dL   Calcium 9.2 8.9 - 10.3 mg/dL   Total Protein 7.2 6.5 - 8.1 g/dL   Albumin 3.8 3.5 - 5.0 g/dL   AST 63 (H) 15 - 41 U/L   ALT 60 17 - 63 U/L   Alkaline Phosphatase 64 38 - 126 U/L   Total Bilirubin 0.6 0.3 - 1.2 mg/dL   GFR calc non Af Amer >60 >60 mL/min   GFR calc Af Amer >60 >60 mL/min    Comment: (NOTE) The eGFR has been calculated using the CKD EPI equation. This calculation has not been validated in all clinical situations. eGFR's persistently <60 mL/min signify possible Chronic Kidney Disease.    Anion gap 7 5 - 15  Ethanol     Status: None   Collection Time: 01/01/17 11:59 PM  Result Value Ref Range   Alcohol, Ethyl (B) <10 <10 mg/dL    Comment:        LOWEST DETECTABLE LIMIT FOR SERUM ALCOHOL IS 10 mg/dL FOR MEDICAL PURPOSES ONLY   Salicylate level     Status: None   Collection Time: 01/01/17 11:59 PM  Result Value Ref Range   Salicylate Lvl <2.9 2.8 - 30.0 mg/dL  Acetaminophen level     Status: Abnormal   Collection Time: 01/01/17 11:59 PM   Result Value Ref Range   Acetaminophen (Tylenol), Serum <10 (L) 10 - 30 ug/mL    Comment:        THERAPEUTIC CONCENTRATIONS VARY SIGNIFICANTLY. A RANGE OF 10-30 ug/mL MAY BE AN EFFECTIVE CONCENTRATION FOR MANY PATIENTS. HOWEVER, SOME ARE BEST TREATED AT CONCENTRATIONS OUTSIDE THIS RANGE. ACETAMINOPHEN CONCENTRATIONS >150 ug/mL AT 4 HOURS AFTER INGESTION AND >50 ug/mL AT 12 HOURS  AFTER INGESTION ARE OFTEN ASSOCIATED WITH TOXIC REACTIONS.   cbc     Status: Abnormal   Collection Time: 01/01/17 11:59 PM  Result Value Ref Range   WBC 5.5 4.0 - 10.5 K/uL   RBC 4.43 4.22 - 5.81 MIL/uL   Hemoglobin 11.7 (L) 13.0 - 17.0 g/dL   HCT 35.0 (L) 39.0 - 52.0 %   MCV 79.0 78.0 - 100.0 fL   MCH 26.4 26.0 - 34.0 pg   MCHC 33.4 30.0 - 36.0 g/dL   RDW 14.6 11.5 - 15.5 %   Platelets 251 150 - 400 K/uL    Current Facility-Administered Medications  Medication Dose Route Frequency Provider Last Rate Last Dose  . acetaminophen (TYLENOL) tablet 650 mg  650 mg Oral N5A PRN Delora Fuel, MD      . alum & mag hydroxide-simeth (MAALOX/MYLANTA) 200-200-20 MG/5ML suspension 30 mL  30 mL Oral O1H PRN Delora Fuel, MD      . gabapentin (NEURONTIN) capsule 200 mg  200 mg Oral BID Nguyet Mercer, MD      . ondansetron (ZOFRAN) tablet 4 mg  4 mg Oral Y8M PRN Delora Fuel, MD      . Rivaroxaban Alveda Reasons) tablet 15 mg  15 mg Oral BID WC Delora Fuel, MD   15 mg at 01/03/17 [id number]   Current Outpatient Prescriptions  Medication Sig Dispense Refill  . ibuprofen (ADVIL,MOTRIN) 800 MG tablet Take 1 tablet (800 mg total) by mouth every 8 (eight) hours as needed. 30 tablet 0  . Rivaroxaban (XARELTO) 15 MG TABS tablet Take 1 tablet (15 mg total) by mouth 2 (two) times daily with a meal. 42 tablet   . [START ON 01/17/2017] rivaroxaban (XARELTO) 20 MG TABS tablet Take 1 tablet (20 mg total) by mouth daily with supper. 30 tablet     Musculoskeletal: Strength & Muscle Tone: within normal limits Gait & Station:  normal Patient leans: N/A  Psychiatric Specialty Exam: Physical Exam  Constitutional: He is oriented to person, place, and time. He appears well-developed and well-nourished.  HENT:  Head: Normocephalic.  Neck: Normal range of motion.  Respiratory: Effort normal.  Musculoskeletal: Normal range of motion.  Neurological: He is alert and oriented to person, place, and time.  Psychiatric: He has a normal mood and affect. His speech is normal and behavior is normal. Judgment and thought content normal. Cognition and memory are normal.    Review of Systems  Psychiatric/Behavioral: Positive for substance abuse.  All other systems reviewed and are negative.   Blood pressure 107/62, pulse (!) 54, temperature 98.1 F (36.7 C), temperature source Oral, resp. rate 17, height 5' 7" (1.702 m), weight 68 kg (150 lb), SpO2 99 %.Body mass index is 23.49 kg/m.  General Appearance: Casual  Eye Contact:  Good  Speech:  Normal Rate  Volume:  Normal  Mood:  Euthymic  Affect:  Congruent  Thought Process:  Coherent and Descriptions of Associations: Intact  Orientation:  Full (Time, Place, and Person)  Thought Content:  WDL and Logical  Suicidal Thoughts:  No  Homicidal Thoughts:  No  Memory:  Immediate;   Good Recent;   Good Remote;   Good  Judgement:  Fair  Insight:  Fair  Psychomotor Activity:  Normal  Concentration:  Concentration: Good and Attention Span: Good  Recall:  Good  Fund of Knowledge:  Fair  Language:  Good  Akathisia:  No  Handed:  Right  AIMS (if indicated):     Assets:  Leisure Time  Physical Health Resilience  ADL's:  Intact  Cognition:  WNL  Sleep:        Treatment Plan Summary: Daily contact with patient to assess and evaluate symptoms and progress in treatment, Medication management and Plan cocaine abuse with cocaine induced mood disorder:  -Crisis stabilization -Medication management:  Started gabapentin 200 mg BID for mood stabilization and withdrawal  symptoms -Individual and substance abuse counseling -Peer support referral  Disposition: No evidence of imminent risk to self or others at present.    Waylan Boga, NP 01/03/2017 10:08 AM  Patient seen face-to-face for psychiatric evaluation, chart reviewed and case discussed with the physician extender and developed treatment plan. Reviewed the information documented and agree with the treatment plan. Corena Pilgrim, MD

## 2017-01-03 NOTE — [facility] Suicide Risk Assessment (Signed)
Suicide Risk Assessment  Discharge Assessment   [facility]   Principal Problem: Cocaine abuse with cocaine-induced mood disorder [facility](HCC) Discharge Diagnoses:  Patient Active Problem List   Diagnosis Date Noted  . Cocaine abuse with cocaine-induced mood disorder [facility](HCC) [F14.14] 01/03/2017    Priority: High  . Acute pulmonary embolism (HCC) [I26.99] 12/25/2016  . Depression [F32.9] 02/02/2016  . DVT (deep venous thrombosis) (HCC) [I82.409] 02/02/2016  . Cocaine dependence, continuous (HCC) [F14.20] 01/19/2016  . Substance-induced psychotic disorder with hallucinations (HCC) [F19.951] 01/18/2016  . Major depressive disorder, recurrent severe without psychotic features (HCC) [F33.2] 01/12/2016  . History of pulmonary embolus (PE) [Z86.711] 01/16/2014  . Tobacco use [Z72.0] 01/16/2014  . Protein C deficiency (HCC) [D68.59] 12/31/2013    Total Time spent with patient: 45 minutes  Musculoskeletal: Strength & Muscle Tone: within normal limits Gait & Station: normal Patient leans: N/A  Psychiatric Specialty Exam: Physical Exam  Constitutional: He is oriented to person, place, and time. He appears well-developed and well-nourished.  HENT:  Head: Normocephalic.  Neck: Normal range of motion.  Respiratory: Effort normal.  Musculoskeletal: Normal range of motion.  Neurological: He is alert and oriented to person, place, and time.  Psychiatric: He has a normal mood and affect. His speech is normal and behavior is normal. Judgment and thought content normal. Cognition and memory are normal.    Review of Systems  Psychiatric/Behavioral: Positive for substance abuse.  All other systems reviewed and are negative.   Blood pressure 107/62, pulse (!) 54, temperature 98.1 F (36.7 C), temperature source Oral, resp. rate 17, height 5\' 7"  (1.702 m), weight 68 kg (150 lb), SpO2 99 %.Body mass index is 23.49 kg/m.  General Appearance: Casual  Eye Contact:  Good  Speech:   Normal Rate  Volume:  Normal  Mood:  Euthymic  Affect:  Congruent  Thought Process:  Coherent and Descriptions of Associations: Intact  Orientation:  Full (Time, Place, and Person)  Thought Content:  WDL and Logical  Suicidal Thoughts:  No  Homicidal Thoughts:  No  Memory:  Immediate;   Good Recent;   Good Remote;   Good  Judgement:  Fair  Insight:  Fair  Psychomotor Activity:  Normal  Concentration:  Concentration: Good and Attention Span: Good  Recall:  Good  Fund of Knowledge:  Fair  Language:  Good  Akathisia:  No  Handed:  Right  AIMS (if indicated):     Assets:  Leisure Time Physical Health Resilience  ADL's:  Intact  Cognition:  WNL  Sleep:       Mental Status Per Nursing Assessment::   On Admission:   cocaine abuse with suicidal ideations and hallucinations  Demographic Factors:  Male  Loss Factors: NA  Historical Factors: NA  Risk Reduction Factors:   Sense of responsibility to family  Continued Clinical Symptoms:  None   Cognitive Features That Contribute To Risk:  None    Suicide Risk:  Minimal: No identifiable suicidal ideation.  Patients presenting with no risk factors but with morbid ruminations; may be classified as minimal risk based on the severity of the depressive symptoms    Plan Of Care/Follow-up recommendations:  Activity:  as tolerated Diet:  heart healhty diet  Riel Hirschman, NP 01/03/2017, 10:18 AM

## 2017-01-03 NOTE — BH Assessment (Addendum)
[facility] Assessment Progress Note  Per Thedore MinsMojeed Akintayo, MD, this pt does not require psychiatric hospitalization at this time.  [facility]  This has been included in pt's discharge instructions.  Pt would also benefit from seeing Peer Support Specialists; Arlys JohnBrian has been asked to speak to pt.  Pt's nurse, Kendal Hymendie, has been notified.  Doylene Canninghomas Elissa Grieshop, MA Triage Specialist [phone]   Addendum:  This pt would also benefit from substance abuse treatment information.  Referral to [facility] has also been added to pt's discharge instructions.  Doylene Canninghomas Lance Galas, MA Triage Specialist [phone]

## 2017-01-03 NOTE — Patient Outreach (Signed)
ED Peer Support Specialist Patient Intake (Complete at intake & 30-60 Day Follow-up)  Name: Peter Washington  MRN: [id number]  Age: 51 y.o.   Date of Admission: 01/03/2017  Intake: Initial Comments:      Primary Reason Admitted: Pt reports he has been experiencing severe headaches for more than 2 weeks which prompted him to come to the ED. While in the ED, the pt reported he also experiences SI and hopelessness. Pt states he has had SI in the past but denies that he has ever acted on his thoughts. Pt reports he has been feeling depressed for most of his life and states the symptoms worsened 2 weeks ago. Pt denies any specific trigger that led to his worsening depression and when asked pt stated "just stuff with my job and it just crossed my mind." Pt endorses VH and states he sees "people and animals." Pt reports the hallucinations occur daily and he will see the visual hallucination for about 10 minutes and it will disappear. Pt endorses frequent cocaine use and occasional alcohol use. Pt was asked to identify his current support system and he stated he has support from his entire family.  Lab values: Alcohol/ETOH: Positive Positive UDS?   Amphetamines:   Barbiturates:   Benzodiazepines:   Cocaine: Yes Opiates:   Cannabinoids:    Demographic information: Gender: Male Ethnicity: African American Marital Status: Single Insurance Status: Uninsured/Self-pay Control and instrumentation engineereceives non-medical governmental assistance (Work Engineer, agriculturalirst/Welfare, Sales executivefood stamps, etc.: No Lives with: Alone Living situation: House/Apartment  Reported Patient History: Patient reported health conditions: Bipolar disorder (Brain Injurys ) Patient aware of HIV and hepatitis status: No  In past year, has patient visited ED for any reason? Yes (Blood clots)  Number of ED visits: 2  Reason(s) for visit:    In past year, has patient been hospitalized for any reason? Yes (Blood Clots)  Number of hospitalizations:    Reason(s) for  hospitalization:    In past year, has patient been arrested? No  Number of arrests:    Reason(s) for arrest:    In past year, has patient been incarcerated? No  Number of incarcerations:    Reason(s) for incarceration:    In past year, has patient received medication-assisted treatment? No  In past year, patient received the following treatments:    In past year, has patient received any harm reduction services? No  Did this include any of the following?    In past year, has patient received care from a mental health provider for diagnosis other than SUD? No  In past year, is this first time patient has overdosed? No  Number of past overdoses:    In past year, is this first time patient has been hospitalized for an overdose? No  Number of hospitalizations for overdose(s):    Is patient currently receiving treatment for a mental health diagnosis? No  Patient reports experiencing difficulty participating in SUD treatment: No    Most important reason(s) for this difficulty?    Has patient received prior services for treatment? No  In past, patient has received services from following agencies:    Plan of Care:  Suggested follow up at these agencies/treatment centers: [facility]  Other information: CPSS talked with Pt to monitor services and to see what services he wants [facility]. CPSS addressed the fact the may need to follow up with [facility] to receive out patient services.    Arlys JohnBrian Stiles Maxcy, CPSS  01/03/2017 11:27 AM

## 2017-01-03 NOTE — ED Notes (Signed)
Pt discharged safely with RX and discharge instructions reviewed.  All belongings were returned to patient.

## 2017-01-03 NOTE — Discharge Instructions (Addendum)
For your mental health needs, you are advised to follow up with [facility].  New and returning patients are seen at their walk-in clinic.  Walk-in hours are Monday - Friday from 8:00 am - 3:00 pm.  Walk-in patients are seen on a first come, first served basis.  Try to arrive as early as possible for he best chance of being seen the same day: ° °     [facility] °     [address] °     [place], [place] [address] °     [phone] ° °To help you maintain a sober lifestyle, a substance abuse treatment program may be beneficial to you.  Contact [facility] at your earliest opportunity to ask about enrolling in their program: ° °     [facility] °     [address] °     [place],  [address] °     [phone] °     New patients are seen at the walk-in clinic every Tuesday from 9:00 am - 12:00 pm °

## 2017-01-06 NOTE — Congregational Nurse Program (Signed)
Congregational Nurse Program Note  Date of Encounter: 01/05/2017  Past Medical History: Past Medical History:  Diagnosis Date  . DVT of lower extremity (deep venous thrombosis) (HCC)   . PE (pulmonary embolism)     Encounter Details:     CNP Questionnaire - 01/05/17 1719      Patient Demographics   Is this a new or existing patient? New   Patient is considered a/an Not Applicable   Race African-American/Black     Patient Assistance   Location of Patient Assistance Not Applicable   Patient's financial/insurance status Low Income;Self-Pay (Uninsured)   Patient referred to apply for the following financial assistance Not Applicable   Food insecurities addressed Not Applicable   Transportation assistance No   Assistance securing medications No   Educational health offerings Behavioral health;Safety;Navigating the healthcare system     Encounter Details   Primary purpose of visit Chronic Illness/Condition Visit;Post ED/Hospitalization Visit;Safety   Was an Emergency Department visit averted? Not Applicable   Does patient have a medical provider? Yes   Patient referred to Clinic;Area Agency   Was a mental health screening completed? (GAINS tool) No   Does patient have dental issues? No   Does patient have vision issues? No   Does your patient have an abnormal blood pressure today? No   Since previous encounter, have you referred patient for abnormal blood pressure that resulted in a new diagnosis or medication change? No   Does your patient have an abnormal blood glucose today? No   Since previous encounter, have you referred patient for abnormal blood glucose that resulted in a new diagnosis or medication change? No   Was there a life-saving intervention made? No     Wanted assistance in "getting into behavioral health".  [facility] and evaluated.  He was determined to be safe from self harm and was discharged with [facility].  Client  [facility] not [facility].  When I reviewed with client the instructions from the ED visit, he became agitated, indicating I was not helping him.  [facility] to "get off my stuff" and have his medications managed.

## 2017-01-07 ENCOUNTER — Emergency Department (HOSPITAL_COMMUNITY)
Admission: EM | Admit: 2017-01-07 | Discharge: 2017-01-07 | Disposition: A | Discharge status: Home / Self Care | Payer: Self-pay | Attending: Emergency Medicine | Admitting: Emergency Medicine

## 2017-01-07 ENCOUNTER — Encounter (HOSPITAL_COMMUNITY): Payer: Self-pay | Admitting: Emergency Medicine

## 2017-01-07 ENCOUNTER — Emergency Department (HOSPITAL_COMMUNITY): Payer: Self-pay

## 2017-01-07 DIAGNOSIS — F17210 Nicotine dependence, cigarettes, uncomplicated: Secondary | ICD-10-CM | POA: Insufficient documentation

## 2017-01-07 DIAGNOSIS — Z79899 Other long term (current) drug therapy: Secondary | ICD-10-CM | POA: Insufficient documentation

## 2017-01-07 DIAGNOSIS — Z86711 Personal history of pulmonary embolism: Secondary | ICD-10-CM | POA: Insufficient documentation

## 2017-01-07 DIAGNOSIS — G4489 Other headache syndrome: Secondary | ICD-10-CM | POA: Insufficient documentation

## 2017-01-07 DIAGNOSIS — Z7901 Long term (current) use of anticoagulants: Secondary | ICD-10-CM | POA: Insufficient documentation

## 2017-01-07 DIAGNOSIS — Z86718 Personal history of other venous thrombosis and embolism: Secondary | ICD-10-CM | POA: Insufficient documentation

## 2017-01-07 MED ORDER — LORATADINE 10 MG PO TABS
10.0000 mg | ORAL_TABLET | Freq: Once | ORAL | Status: AC
  Administered 2017-01-07: 10 mg via ORAL
  Filled 2017-01-07: qty 1

## 2017-01-07 MED ORDER — METOCLOPRAMIDE HCL 10 MG PO TABS
5.0000 mg | ORAL_TABLET | Freq: Once | ORAL | Status: AC
  Administered 2017-01-07: 5 mg via ORAL
  Filled 2017-01-07: qty 1

## 2017-01-07 MED ORDER — KETOROLAC TROMETHAMINE 30 MG/ML IJ SOLN
30.0000 mg | Freq: Once | INTRAMUSCULAR | Status: AC
  Administered 2017-01-07: 30 mg via INTRAMUSCULAR
  Filled 2017-01-07: qty 1

## 2017-01-07 NOTE — ED Provider Notes (Signed)
[facility] Provider Note   CSN: [id number] Arrival date & time: 01/07/17  0043     History   Chief Complaint Chief Complaint  Patient presents with  . Migraine    HPI Peter Washington is a 51 y.o. male.  The history is provided by the patient.  Migraine  This is a recurrent problem. The current episode started yesterday. The problem occurs constantly. The problem has not changed since onset.Associated symptoms include headaches. Pertinent negatives include no chest pain, no abdominal pain and no shortness of breath. Nothing aggravates the symptoms. Nothing relieves the symptoms. He has tried nothing for the symptoms. The treatment provided no relief.  No f/c/r. No weakness nor numbness no changes in vision or speech nor cognition.    Past Medical History:  Diagnosis Date  . DVT of lower extremity (deep venous thrombosis) (HCC)   . PE (pulmonary embolism)     Patient Active Problem List   Diagnosis Date Noted  . Cocaine abuse with cocaine-induced mood disorder (HCC) 01/03/2017  . Acute pulmonary embolism (HCC) 12/25/2016  . Depression 02/02/2016  . DVT (deep venous thrombosis) (HCC) 02/02/2016  . Cocaine dependence, continuous (HCC) 01/19/2016  . 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 History of pulmonary embolus (PE) 01/16/2014  . Tobacco use 01/16/2014  . Protein C deficiency (HCC) 12/31/2013    History reviewed. No pertinent surgical history.     Home Medications    Prior to Admission medications   Medication Sig Start Date End Date Taking? Authorizing Provider  gabapentin (NEURONTIN) 100 MG capsule Take 2 capsules (200 mg total) by mouth 2 (two) times daily. 01/03/17   Charm RingsLord, Jamison Y, NP  Rivaroxaban (XARELTO) 15 MG TABS tablet Take 1 tablet (15 mg total) by mouth 2 (two) times daily with a meal.  12/27/16   Angelita InglesWinfrey, William B, MD  rivaroxaban (XARELTO) 20 MG TABS tablet Take 1 tablet (20 mg total) by mouth daily with supper. 01/17/17   Angelita InglesWinfrey, William B, MD    Family History Family History  Problem Relation Age of Onset  . Diabetes Neg Hx   . Stroke Neg Hx   . Cancer Neg Hx   . Clotting disorder Neg Hx     Social History Social History  Substance Use Topics  . Smoking status: Current Every Day Smoker    Packs/day: 0.25    Types: Cigarettes  . Smokeless tobacco: Never Used     Comment: PATIENT STATES HE DO NOT SMOKE/SEEM TO NOT KNOW WHEN  HE STOPPED  . Alcohol use Yes     Comment: 3 beers on weekends     Allergies   Lactose intolerance (gi) and Pork-derived products   Review of Systems Review of Systems  Constitutional: Negative for diaphoresis and fever.  Eyes: Negative for photophobia and visual disturbance.  Respiratory: Negative for shortness of breath.   Cardiovascular: Negative for chest pain.  Gastrointestinal: Negative for abdominal pain.  Musculoskeletal: Negative for neck pain and neck stiffness.  Neurological: Positive for headaches. Negative for dizziness, tremors, seizures, syncope, facial asymmetry, speech difficulty, weakness, light-headedness and numbness.  All other systems reviewed and are negative.    Physical Exam Updated Vital Signs BP 112/76 (BP Location: Right Arm)   Pulse (!) 48   Temp 97.9 F (36.6 C) (Oral)   Resp 16   SpO2 99%   Physical Exam  Constitutional: He is  oriented to person, place, and time. He appears well-developed and well-nourished. No distress.  HENT:  Head: Normocephalic and atraumatic.  Mouth/Throat: Oropharynx is clear and moist. No oropharyngeal exudate.  Eyes: Pupils are equal, round, and reactive to light. Conjunctivae and EOM are normal.  No proptosis, intact cognition  Neck: Normal range of motion. Neck supple. No JVD present.  Cardiovascular: Normal rate, regular rhythm, normal heart sounds and  intact distal pulses.   Pulmonary/Chest: Effort normal and breath sounds normal. No stridor. He has no wheezes. He has no rales.  Abdominal: Soft. Bowel sounds are normal. He exhibits no mass. There is no tenderness. There is no rebound and no guarding.  Musculoskeletal: Normal range of motion.  Neurological: He is alert and oriented to person, place, and time. He displays normal reflexes. No cranial nerve deficit. Coordination normal.  Skin: Skin is warm and dry. Capillary refill takes less than 2 seconds.  Psychiatric: He has a normal mood and affect.     ED Treatments / Results   Vitals:   01/07/17 0109  BP: 112/76  Pulse: (!) 48  Resp: 16  Temp: 97.9 F (36.6 C)  SpO2: 99%    Radiology Ct Head Wo Contrast  Result Date: 01/07/2017 CLINICAL DATA:  51 year old male with headache. EXAM: CT HEAD WITHOUT CONTRAST TECHNIQUE: Contiguous axial images were obtained from the base of the skull through the vertex without intravenous contrast. COMPARISON:  Brain MRI dated 01/13/2016 FINDINGS: Brain: The ventricles and sulci are appropriate in size for patient's age. Mild periventricular and deep white matter chronic microvascular ischemic changes noted. There is no acute intracranial hemorrhage. No mass effect or midline shift. No extra-axial fluid collection. Vascular: No hyperdense vessel or unexpected calcification. Skull: Normal. Negative for fracture or focal lesion. Sinuses/Orbits: The visualized paranasal sinuses and mastoid air cells are clear. There is probable old nasal bone fractures. Correlation with clinical exam and point tenderness recommended. Other: None IMPRESSION: 1. No acute intracranial hemorrhage. 2. Mild chronic microvascular ischemic changes. Electronically Signed   By: Elgie Collard M.D.   On: 01/07/2017 02:10    Procedures Procedures (including critical care time)  Medications Ordered in ED Medications  metoCLOPramide (REGLAN) tablet 5 mg (not administered)    loratadine (CLARITIN) tablet 10 mg (not administered)  ketorolac (TORADOL) 30 MG/ML injection 30 mg (30 mg Intramuscular Given 01/07/17 0548)       Final Clinical Impressions(s) / ED Diagnoses   Headache:  Resting comfortably in bed.  Follow up with your PMD and neurology for ongoing care.  Strict return precautions given.  Strict return precautions given for  Shortness of breath, swelling or the lips or tongue, chest pain, dyspnea on exertion, new weakness or numbness changes in vision or speech,  Inability to tolerate liquids or food, changes in voice cough, altered mental status or any concerns. No signs of systemic illness or infection. The patient is nontoxic-appearing on exam and vital signs are within normal limits.    I have reviewed the triage vital signs and the nursing notes. Pertinent labs &imaging results that were available during my care of the patient were reviewed by me and considered in my medical decision making (see chart for details).  After history, exam, and medical workup I feel the patient has been appropriately medically screened and is safe for discharge home. Pertinent diagnoses were discussed with the patient. Patient was given return precautions.    Lianah Peed, MD 01/07/17 [phone]

## 2017-01-07 NOTE — ED Triage Notes (Signed)
Patient here with headache that he has had since waking up yesterday morning.  Patient denies any nausea or vomiting at this time.  Patient states that he was seen last week for a PE and is on Xarelto for his PE.

## 2017-01-07 NOTE — ED Notes (Signed)
Unable to sign e signature due to pad not working  

## 2017-01-14 ENCOUNTER — Ambulatory Visit (INDEPENDENT_AMBULATORY_CARE_PROVIDER_SITE_OTHER): Payer: Self-pay | Admitting: Internal Medicine

## 2017-01-14 VITALS — BP 122/55 | HR 61 | Temp 98.4°F | Ht 67.0 in | Wt 161.4 lb

## 2017-01-14 DIAGNOSIS — D6859 Other primary thrombophilia: Secondary | ICD-10-CM

## 2017-01-14 DIAGNOSIS — I2699 Other pulmonary embolism without acute cor pulmonale: Secondary | ICD-10-CM

## 2017-01-14 DIAGNOSIS — Z86718 Personal history of other venous thrombosis and embolism: Secondary | ICD-10-CM

## 2017-01-14 NOTE — Patient Instructions (Addendum)
Peter Washington,  We are giving you some samples of Xarelto today. We will continue working on getting you into the medication assistance program to get your long term prescription covered. This will be a lifelong medication for you.   Please follow up in 1 week

## 2017-01-14 NOTE — Progress Notes (Signed)
   CC: PE follow up  HPI:  [name] is a 51 y.o. male with a past medical history listed below here today for follow up of his recent hospitalization for recurrent pulmonary emboli.Marland Kitchen.    He was recently admitted to the hospital on 12/25/16 with pleuritic chest pain and dyspnea. Was found to have bilateral pulmonary emboli. 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 He did have evidence of right heart strain on ECHO but was hemodynamically stable. Started on Xarelto during the hospitalization and given enough to make it to this visit today until financial assistance could be obtained.   Today, he reports he still is having occasional chest pain and shortness of breath. Does note that the occurences are becoming less frequent, severe, and decreasing in duration. Does report feeling like what brought him to the hospital. No chest pain or shortness of breath today. No LE pain or edema. Reports compliance with Xarelto. Notes he only has one pill left. Discussed with Dr. Selena BattenKim. Having difficulties getting patient into medication assistance program.    Past Medical History:  Diagnosis Date  . DVT of lower extremity (deep venous thrombosis) (HCC)   . PE (pulmonary embolism)    Review of Systems:  No chest pain or shortness of breath today, does report occasional chest pain and shortness of breath  Physical Exam:  Vitals:   01/14/17 1609  BP: (!) 122/55  Pulse: 61  Temp: 98.4 F (36.9 C)  TempSrc: Oral  SpO2: 99%  Weight: 161 lb 6.4 oz (73.2 kg)  Height: 5\' 7"  (1.702 m)   GENERAL- alert, co-operative, appears as stated age, not in any distress. CARDIAC- RRR, no murmurs, rubs or gallops. RESP- Moving equal volumes of air, and clear to auscultation bilaterally, no wheezes or crackles. ABDOMEN- Soft, nontender, bowel sounds present. EXTREMITIES- pulse 2+, symmetric, no pedal edema. SKIN- Warm, dry, No rash  or lesion. PSYCH- Normal mood and affect, appropriate thought content and speech.   Assessment & Plan:   See Encounters Tab for problem based charting.  Patient discussed with Dr. Sandre Kittyaines

## 2017-01-15 LAB — CBC
Hematocrit: 35.4 % — ABNORMAL LOW (ref 37.5–51.0)
Hemoglobin: 11.2 g/dL — ABNORMAL LOW (ref 13.0–17.7)
MCH: 25.6 pg — ABNORMAL LOW (ref 26.6–33.0)
MCHC: 31.6 g/dL (ref 31.5–35.7)
MCV: 81 fL (ref 79–97)
Platelets: 219 10*3/uL (ref 150–379)
RBC: 4.37 x10E6/uL (ref 4.14–5.80)
RDW: 14.9 % (ref 12.3–15.4)
WBC: 5.6 10*3/uL (ref 3.4–10.8)

## 2017-01-15 LAB — BMP8+ANION GAP
Anion Gap: 13 mmol/L (ref 10.0–18.0)
BUN/Creatinine Ratio: 17 (ref 9–20)
BUN: 21 mg/dL (ref 6–24)
CO2: 23 mmol/L (ref 20–29)
Calcium: 8.7 mg/dL (ref 8.7–10.2)
Chloride: 107 mmol/L — ABNORMAL HIGH (ref 96–106)
Creatinine, Ser: 1.24 mg/dL (ref 0.76–1.27)
GFR calc Af Amer: 76 mL/min/{1.73_m2} (ref >59)
GFR calc non Af Amer: 66 mL/min/{1.73_m2} (ref >59)
Glucose: 122 mg/dL — ABNORMAL HIGH (ref 65–99)
Potassium: 4.4 mmol/L (ref 3.5–5.2)
Sodium: 143 mmol/L (ref 134–144)

## 2017-01-16 NOTE — Assessment & Plan Note (Signed)
Provided samples for another week with Xarelto. Dr. Selena BattenKim still working on medication assistance program. CBC and BMET stable today. Follow up in one week for re-check.

## 2017-01-17 NOTE — Progress Notes (Signed)
Internal Medicine Clinic Attending  Case discussed with Dr. Boswell  at the time of the visit.  We reviewed the resident's history and exam and pertinent patient test results.  I agree with the assessment, diagnosis, and plan of care documented in the resident's note.  Alexander N Raines, MD   

## 2017-01-18 ENCOUNTER — Ambulatory Visit (INDEPENDENT_AMBULATORY_CARE_PROVIDER_SITE_OTHER): Payer: Self-pay | Admitting: *Deleted

## 2017-01-18 DIAGNOSIS — Z111 Encounter for screening for respiratory tuberculosis: Secondary | ICD-10-CM

## 2017-01-18 DIAGNOSIS — Z720 Tobacco use: Secondary | ICD-10-CM

## 2017-01-20 ENCOUNTER — Ambulatory Visit: Payer: Self-pay | Admitting: Pharmacist

## 2017-01-20 ENCOUNTER — Telehealth: Payer: Self-pay | Admitting: *Deleted

## 2017-01-20 ENCOUNTER — Encounter: Payer: Self-pay | Admitting: Oncology

## 2017-01-20 DIAGNOSIS — I2699 Other pulmonary embolism without acute cor pulmonale: Secondary | ICD-10-CM

## 2017-01-20 NOTE — Progress Notes (Signed)
Patient was approved for [employer] patient assistance program. Advised patient to contact program, samples provided today.

## 2017-01-20 NOTE — Progress Notes (Unsigned)
51 year old man who just established in our clinic last week on November 2.  He was recently diagnosed with bilateral pulmonary emboli on October 13.  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  He has had recurrent thrombotic events due to noncompliance. He is a Cytogeneticistveteran.  He is applying for subsidized housing through a [employer] program.  They requested a PPD skin test.  This was done in our office and he is here today to have a read. He is dark skinned but there is a 30 mm area of erythema and induration volar surface right arm. He denies any prior contact with tuberculosis. I reviewed a chest x-ray done during recent hospitalization and there are no signs of active pulmonary disease.  Recent labs show mild chronic renal insufficiency with creatinine 1.2, microcytic anemia with hemoglobin of 11, mild elevation of AST liver enzyme.  Impression: Latent TB Plan: I discussed with him that he does not have an active infection but that he could become actively infected if he is not treated appropriately now.  Recent recommendations have narrowed down the course of antibiotics to as little as 3 months. He was given instructions to go to the local health department on [address] to get established and begin antibiotic therapy.

## 2017-01-20 NOTE — Telephone Encounter (Signed)
Returned today for TB skin reading, triage reads as positive, dr Cyndie Chimegranfortuna notified for evaluation

## 2017-01-21 NOTE — Telephone Encounter (Signed)
Lm for Peter Washington at [facility] dept, she called back, faxed all info r/t +pos+ tb skin test to [phone], ph [phone]

## 2017-01-30 ENCOUNTER — Emergency Department (HOSPITAL_COMMUNITY)
Admission: EM | Admit: 2017-01-30 | Discharge: 2017-01-31 | Disposition: A | Discharge status: Home / Self Care | Payer: Self-pay | Attending: Emergency Medicine | Admitting: Emergency Medicine

## 2017-01-30 ENCOUNTER — Emergency Department (HOSPITAL_COMMUNITY): Payer: Self-pay

## 2017-01-30 ENCOUNTER — Encounter (HOSPITAL_COMMUNITY): Payer: Self-pay | Admitting: Emergency Medicine

## 2017-01-30 ENCOUNTER — Other Ambulatory Visit: Payer: Self-pay

## 2017-01-30 DIAGNOSIS — Z5321 Procedure and treatment not carried out due to patient leaving prior to being seen by health care provider: Secondary | ICD-10-CM | POA: Insufficient documentation

## 2017-01-30 DIAGNOSIS — R079 Chest pain, unspecified: Secondary | ICD-10-CM | POA: Insufficient documentation

## 2017-01-30 LAB — BASIC METABOLIC PANEL
Anion gap: 4 — ABNORMAL LOW (ref 5–15)
BUN: 18 mg/dL (ref 6–20)
CALCIUM: 8.5 mg/dL — AB (ref 8.9–10.3)
CO2: 26 mmol/L (ref 22–32)
Chloride: 111 mmol/L (ref 101–111)
Creatinine, Ser: 1.06 mg/dL (ref 0.61–1.24)
GFR calc Af Amer: >60 mL/min (ref >60)
GLUCOSE: 100 mg/dL — AB (ref 65–99)
Potassium: 4.4 mmol/L (ref 3.5–5.1)
Sodium: 141 mmol/L (ref 135–145)

## 2017-01-30 LAB — CBC
HCT: 33.7 % — ABNORMAL LOW (ref 39.0–52.0)
Hemoglobin: 10.7 g/dL — ABNORMAL LOW (ref 13.0–17.0)
MCH: 25.6 pg — AB (ref 26.0–34.0)
MCHC: 31.8 g/dL (ref 30.0–36.0)
MCV: 80.6 fL (ref 78.0–100.0)
Platelets: 207 10*3/uL (ref 150–400)
RBC: 4.18 MIL/uL — ABNORMAL LOW (ref 4.22–5.81)
RDW: 15.2 % (ref 11.5–15.5)
WBC: 4.7 10*3/uL (ref 4.0–10.5)

## 2017-01-30 LAB — I-STAT TROPONIN, ED: TROPONIN I, POC: 0 ng/mL (ref 0.00–0.08)

## 2017-01-30 NOTE — ED Triage Notes (Signed)
Reports having mid chest pain that radiates to left side that started on Friday.  Reports being out of xarelto since Friday.  Reports slight SOB.  Breathing easy and nonlabored in triage.

## 2017-01-30 NOTE — ED Notes (Signed)
Pt did not answer when name was called X2

## 2017-02-01 ENCOUNTER — Other Ambulatory Visit: Payer: Self-pay

## 2017-02-01 ENCOUNTER — Emergency Department (HOSPITAL_COMMUNITY): Payer: Self-pay

## 2017-02-01 ENCOUNTER — Encounter (HOSPITAL_COMMUNITY): Payer: Self-pay | Admitting: Emergency Medicine

## 2017-02-01 DIAGNOSIS — R072 Precordial pain: Secondary | ICD-10-CM | POA: Insufficient documentation

## 2017-02-01 DIAGNOSIS — Z87891 Personal history of nicotine dependence: Secondary | ICD-10-CM | POA: Insufficient documentation

## 2017-02-01 DIAGNOSIS — Z760 Encounter for issue of repeat prescription: Secondary | ICD-10-CM | POA: Insufficient documentation

## 2017-02-01 DIAGNOSIS — Z79899 Other long term (current) drug therapy: Secondary | ICD-10-CM | POA: Insufficient documentation

## 2017-02-01 DIAGNOSIS — Z7901 Long term (current) use of anticoagulants: Secondary | ICD-10-CM | POA: Insufficient documentation

## 2017-02-01 DIAGNOSIS — Z86711 Personal history of pulmonary embolism: Secondary | ICD-10-CM | POA: Insufficient documentation

## 2017-02-01 DIAGNOSIS — R0602 Shortness of breath: Secondary | ICD-10-CM | POA: Insufficient documentation

## 2017-02-01 LAB — CBC
HCT: 34.8 % — ABNORMAL LOW (ref 39.0–52.0)
HEMOGLOBIN: 11.3 g/dL — AB (ref 13.0–17.0)
MCH: 26.3 pg (ref 26.0–34.0)
MCHC: 32.5 g/dL (ref 30.0–36.0)
MCV: 80.9 fL (ref 78.0–100.0)
Platelets: 208 10*3/uL (ref 150–400)
RBC: 4.3 MIL/uL (ref 4.22–5.81)
RDW: 15.9 % — ABNORMAL HIGH (ref 11.5–15.5)
WBC: 5.6 10*3/uL (ref 4.0–10.5)

## 2017-02-01 LAB — I-STAT TROPONIN, ED: Troponin i, poc: 0 ng/mL (ref 0.00–0.08)

## 2017-02-01 NOTE — ED Triage Notes (Signed)
Pt presents to ED for assessment of right and left sided chest pain starting 2 days ago.  Pt states he is currently on Xarelto for PE and he ran out of the medicine four days ago.  Pt c/o dizziness/lightheadedness, some shortness of breath.  NAD at triage.

## 2017-02-02 ENCOUNTER — Emergency Department (HOSPITAL_COMMUNITY): Payer: Self-pay

## 2017-02-02 ENCOUNTER — Emergency Department (HOSPITAL_COMMUNITY)
Admission: EM | Admit: 2017-02-02 | Discharge: 2017-02-02 | Disposition: A | Discharge status: Home / Self Care | Payer: Self-pay | Attending: Emergency Medicine | Admitting: Emergency Medicine

## 2017-02-02 DIAGNOSIS — Z760 Encounter for issue of repeat prescription: Secondary | ICD-10-CM

## 2017-02-02 DIAGNOSIS — R072 Precordial pain: Secondary | ICD-10-CM

## 2017-02-02 LAB — I-STAT TROPONIN, ED: TROPONIN I, POC: 0 ng/mL (ref 0.00–0.08)

## 2017-02-02 LAB — BASIC METABOLIC PANEL
Anion gap: 5 (ref 5–15)
BUN: 25 mg/dL — AB (ref 6–20)
CO2: 27 mmol/L (ref 22–32)
CREATININE: 1.25 mg/dL — AB (ref 0.61–1.24)
Calcium: 8.5 mg/dL — ABNORMAL LOW (ref 8.9–10.3)
Chloride: 107 mmol/L (ref 101–111)
GFR calc Af Amer: >60 mL/min (ref >60)
GLUCOSE: 86 mg/dL (ref 65–99)
POTASSIUM: 3.9 mmol/L (ref 3.5–5.1)
SODIUM: 139 mmol/L (ref 135–145)

## 2017-02-02 MED ORDER — RIVAROXABAN 20 MG PO TABS
20.0000 mg | ORAL_TABLET | Freq: Every day | ORAL | Status: DC
  Filled 2017-02-02: qty 1

## 2017-02-02 MED ORDER — IOPAMIDOL (ISOVUE-370) INJECTION 76%
100.0000 mL | Freq: Once | INTRAVENOUS | Status: AC | PRN
  Administered 2017-02-02: 100 mL via INTRAVENOUS

## 2017-02-02 MED ORDER — RIVAROXABAN 20 MG PO TABS
20.0000 mg | ORAL_TABLET | Freq: Every day | ORAL | Status: DC
  Administered 2017-02-02: 20 mg via ORAL
  Filled 2017-02-02: qty 1

## 2017-02-02 MED ORDER — RIVAROXABAN (XARELTO) EDUCATION KIT FOR DVT/PE PATIENTS
PACK | Freq: Once | Status: DC
  Filled 2017-02-02: qty 1

## 2017-02-02 NOTE — ED Notes (Signed)
Pt. To CT via stretcher. 

## 2017-02-02 NOTE — ED Provider Notes (Signed)
[facility] EMERGENCY DEPARTMENT Provider Note   CSN: [id number] Arrival date & time: 02/01/17  2330     History   Chief Complaint Chief Complaint  Patient presents with  . Chest Pain  . Medication Refill    HPI Peter Washington is a 51 y.o. male.  The history is provided by the patient and medical records. No language interpreter was used.  Chest Pain   Associated symptoms include shortness of breath. Pertinent negatives include no cough and no palpitations.  Medication Refill     Peter Washington is a 51 y.o. male  with a PMH of prior DVT and PE who should be on Xarelto, substance abuse including hx of cocaine abuse who presents to the Emergency Department complaining of sharp, achy chest pain x 2 days. Pain intensifies when he breaths. He believes this feels similar to pain he experienced when diagnosed with a PE. He is on Xarelto, however ran out about 4 days ago, so has not been on any anti-coagulation at that time.   Past Medical History:  Diagnosis Date  . DVT of lower extremity (deep venous thrombosis) ([place])   . PE (pulmonary embolism)     Patient Active Problem List   Diagnosis Date Noted  . Cocaine abuse with cocaine-induced mood disorder ([place]) 01/03/2017  . Acute pulmonary embolism ([place]) 12/25/2016  . Depression 02/02/2016  . DVT (deep venous thrombosis) ([place]) 02/02/2016  . Cocaine dependence, continuous ([place]) 01/19/2016  . Substance-induced psychotic disorder with hallucinations ([place]) 01/18/2016  . Major depressive disorder, recurrent severe without psychotic features ([place]) 01/12/2016  . History of pulmonary embolus (PE) 01/16/2014  . Tobacco use 01/16/2014  . Protein C deficiency ([place]) 12/31/2013    History reviewed. No pertinent surgical history.     Home Medications    Prior to Admission medications   Medication Sig Start Date End Date Taking? Authorizing Provider  rivaroxaban (XARELTO) 20 MG TABS tablet Take 20 mg by mouth daily with  supper.   Yes [provider]  gabapentin (NEURONTIN) 100 MG capsule Take 2 capsules (200 mg total) by mouth 2 (two) times daily. Patient not taking: Reported on 02/02/2017 01/03/17   Patrecia Pour, NP  Rivaroxaban (XARELTO) 15 MG TABS tablet Take 1 tablet (15 mg total) by mouth 2 (two) times daily with a meal. Patient not taking: Reported on 02/02/2017 12/27/16   Katherine Roan, MD    Family History Family History  Problem Relation Age of Onset  . Diabetes Neg Hx   . Stroke Neg Hx   . Cancer Neg Hx   . Clotting disorder Neg Hx     Social History Social History   Tobacco Use  . Smoking status: Former Smoker    Packs/day: 0.25    Types: Cigarettes  . Smokeless tobacco: Never Used  . Tobacco comment: PATIENT STATES HE DO NOT SMOKE/SEEM TO NOT KNOW WHEN  HE STOPPED  Substance Use Topics  . Alcohol use: Yes    Comment: 3 beers on weekends  . Drug use: No     Allergies   Lactose intolerance (gi); Pork-derived products; and Tramadol   Review of Systems Review of Systems  Respiratory: Positive for shortness of breath. Negative for cough.   Cardiovascular: Positive for chest pain. Negative for palpitations and leg swelling.  All other systems reviewed and are negative.    Physical Exam Updated Vital Signs BP 110/74   Pulse (!) 59   Temp 97.7 F (36.5 C) (Oral)  Resp 19   SpO2 100%   Physical Exam  Constitutional: He is oriented to person, place, and time. He appears well-developed and well-nourished. No distress.  HENT:  Head: Normocephalic and atraumatic.  Neck: Neck supple. No JVD present.  Cardiovascular: Regular rhythm and normal heart sounds.  No murmur heard. Bradycardic but regular.  Pulmonary/Chest: Effort normal and breath sounds normal. No respiratory distress. He has no wheezes. He has no rales.  Abdominal: Soft. He exhibits no distension. There is no tenderness.  Musculoskeletal: He exhibits no edema.  Neurological: He is alert and  oriented to person, place, and time.  Skin: Skin is warm and dry.  Nursing note and vitals reviewed.    ED Treatments / Results  Labs (all labs ordered are listed, but only abnormal results are displayed) Labs Reviewed  BASIC METABOLIC PANEL - Abnormal; Notable for the following components:      Result Value   BUN 25 (*)    Creatinine, Ser 1.25 (*)    Calcium 8.5 (*)    All other components within normal limits  CBC - Abnormal; Notable for the following components:   Hemoglobin 11.3 (*)    HCT 34.8 (*)    RDW 15.9 (*)    All other components within normal limits  I-STAT TROPONIN, ED  I-STAT TROPONIN, ED    EKG  EKG Interpretation  Date/Time:  Tuesday February 01 2017 23:35:56 EST Ventricular Rate:  49 PR Interval:  126 QRS Duration: 98 QT Interval:  446 QTC Calculation: 402 R Axis:   68 Text Interpretation:  Marked sinus bradycardia T wave abnormality, consider lateral ischemia Abnormal ECG When compared with ECG of 01/30/2017, No significant change was found Confirmed by Delora Fuel ([id number]) on 02/01/2017 11:41:27 PM       Radiology Dg Chest 2 View  Result Date: 02/02/2017 CLINICAL DATA:  Chest pain EXAM: CHEST  2 VIEW COMPARISON:  01/30/2017 FINDINGS: Shallow inspiration. Normal heart size and pulmonary vascularity. No focal airspace disease or consolidation in the lungs. No blunting of costophrenic angles. No pneumothorax. Mediastinal contours appear intact. Degenerative changes in the spine. IMPRESSION: No active cardiopulmonary disease. Electronically Signed   By: Lucienne Capers M.D.   On: 02/02/2017 00:00   Ct Angio Chest Pe W And/or Wo Contrast  Result Date: 02/02/2017 CLINICAL DATA:  51 year old male with chest pain and concern for pulmonary embolism. EXAM: CT ANGIOGRAPHY CHEST WITH CONTRAST TECHNIQUE: Multidetector CT imaging of the chest was performed using the standard protocol during bolus administration of intravenous contrast. Multiplanar CT image  reconstructions and MIPs were obtained to evaluate the vascular anatomy. CONTRAST:  112m ISOVUE-370 IOPAMIDOL (ISOVUE-370) INJECTION 76% COMPARISON:  Chest radiograph dated 02/01/2017 FINDINGS: Cardiovascular: There is no cardiomegaly or pericardial effusion. The thoracic aorta appears unremarkable for the degree of enhancement. There is a small linear nonocclusive clot in the segmental right lower lobe pulmonary artery (series 5, image 83) consistent with residual clot or scarring. There has been significant interval decrease in the size of this clot compared to the study of 12/25/2016. No acute pulmonary artery embolism. Mediastinum/Nodes: There is no hilar or mediastinal adenopathy. Esophagus and the thyroid gland are grossly unremarkable. No mediastinal fluid collection. Lungs/Pleura: There is a 5 mm nodule at the left lung base (series 6, image 108) stable compared to the CT of 12/29/2013. Right lung base linear atelectasis/scarring. There is no focal consolidation, pleural effusion, or pneumothorax. The central airways are patent. A 3 mm left apical nodule (series 6, image  18) appears stable Upper Abdomen: Focal area of calcification in the left lobe of the liver, likely an old granuloma. The visualized upper abdomen is otherwise unremarkable. Musculoskeletal: Mild degenerative changes of the spine. No acute osseous pathology. Review of the MIP images confirms the above findings. IMPRESSION: 1. No acute intrathoracic pathology. No CT evidence of acute pulmonary embolism. Small residual nonocclusive thrombus is seen in the right lower lobe with significant interval decrease in size compared to the prior CT. 2. Stable small left pulmonary nodules. Electronically Signed   By: Anner Crete M.D.   On: 02/02/2017 03:43    Procedures Procedures (including critical care time)  Medications Ordered in ED Medications  rivaroxaban [facility] Reasons) Education Kit for DVT/PE patients (not administered)  rivaroxaban  (XARELTO) tablet 20 mg (20 mg Oral Given 02/02/17 0438)  iopamidol (ISOVUE-370) 76 % injection 100 mL (100 mLs Intravenous Contrast Given 02/02/17 0308)     Initial Impression / Assessment and Plan / ED Course  I have reviewed the triage vital signs and the nursing notes.  Pertinent labs & imaging results that were available during my care of the patient were reviewed by me and considered in my medical decision making (see chart for details).    Peter Washington is a 51 y.o. male who presents to ED for chest pain and shortness of breath x 2 days. Hx of PE and should be on Xarelto, however has been out for several days. EKG unchanged from previous. Troponin negative x 2. CXR negative. Given high risk for PE, CT angio was obtained with no acute findings. He does have a small residual nonocclusive thrombus which has significantly decreased in size. Pharmacy staff appreciated and given patient enough of Xarelto to get through the weekend. Patient understands to go to PCP Monday to get refill. Return precautions and importance of medication compliance discussed and all questions answered.  Patient discussed with Dr. Randal Buba who agrees with treatment plan.    Final Clinical Impressions(s) / ED Diagnoses   Final diagnoses:  Precordial pain  Encounter for medication refill    ED Discharge Orders    None       Ward, Ozella Al[place], PA-C 02/02/17 [place], April, MD 02/02/17 [id number]

## 2017-02-02 NOTE — ED Notes (Signed)
Pt. Returned from CT via stretcher. 

## 2017-02-02 NOTE — Discharge Instructions (Addendum)
It was my pleasure taking care of you today!   Please take your Xarelto daily as directed. It is very important that you do not miss any doses.   Call your primary care doctor to schedule an appointment to get your prescription for further Xarelto.   Return to ER for new or worsening symptoms, any additional concerns.

## 2017-02-05 ENCOUNTER — Encounter (HOSPITAL_COMMUNITY): Payer: Self-pay | Admitting: Nurse Practitioner

## 2017-02-05 DIAGNOSIS — Z86718 Personal history of other venous thrombosis and embolism: Secondary | ICD-10-CM | POA: Insufficient documentation

## 2017-02-05 DIAGNOSIS — R079 Chest pain, unspecified: Secondary | ICD-10-CM | POA: Insufficient documentation

## 2017-02-05 DIAGNOSIS — M25511 Pain in right shoulder: Secondary | ICD-10-CM | POA: Insufficient documentation

## 2017-02-05 DIAGNOSIS — Z79899 Other long term (current) drug therapy: Secondary | ICD-10-CM | POA: Insufficient documentation

## 2017-02-05 DIAGNOSIS — M25512 Pain in left shoulder: Secondary | ICD-10-CM | POA: Insufficient documentation

## 2017-02-05 DIAGNOSIS — Z87891 Personal history of nicotine dependence: Secondary | ICD-10-CM | POA: Insufficient documentation

## 2017-02-05 NOTE — ED Triage Notes (Signed)
Pt presents with multiple complaints, narrows down to chest pain and bilateral shoulder pain.

## 2017-02-06 ENCOUNTER — Emergency Department (HOSPITAL_COMMUNITY)
Admission: EM | Admit: 2017-02-06 | Discharge: 2017-02-06 | Disposition: A | Discharge status: Home / Self Care | Payer: Self-pay | Attending: Emergency Medicine | Admitting: Emergency Medicine

## 2017-02-06 ENCOUNTER — Emergency Department (HOSPITAL_COMMUNITY): Payer: Self-pay

## 2017-02-06 DIAGNOSIS — R079 Chest pain, unspecified: Secondary | ICD-10-CM

## 2017-02-06 MED ORDER — IBUPROFEN 200 MG PO TABS
600.0000 mg | ORAL_TABLET | Freq: Once | ORAL | Status: AC
  Administered 2017-02-06: 600 mg via ORAL
  Filled 2017-02-06: qty 3

## 2017-02-06 MED ORDER — ACETAMINOPHEN 325 MG PO TABS
650.0000 mg | ORAL_TABLET | Freq: Once | ORAL | Status: AC
  Administered 2017-02-06: 650 mg via ORAL
  Filled 2017-02-06: qty 2

## 2017-02-06 MED ORDER — RIVAROXABAN 15 MG PO TABS: 15.0000 mg | ORAL_TABLET | Freq: Two times a day (BID) | ORAL | 0 refills | Status: DC

## 2017-02-07 ENCOUNTER — Telehealth: Payer: Self-pay

## 2017-02-07 NOTE — Telephone Encounter (Signed)
Patient picked up xarelto samples, stressed the importance of keeping appt w/ Dr. Selena BattenKim 02/10/17. Per Dr. Karma GreaserBoswell, no further refills unless seen by Dr.Kim. Verbalized understanding.

## 2017-02-07 NOTE — Telephone Encounter (Signed)
LM to return call- patient will need to come p/u samples. 3-day supply samples available today (02/07/17). Has appt w/ Dr. Selena BattenKim 02/10/17.

## 2017-02-07 NOTE — ED Provider Notes (Signed)
[facility]-EMERGENCY DEPT Provider Note   CSN: [id number] Arrival date & time: 02/05/17  2335     History   Chief Complaint Chief Complaint  Patient presents with  . Shoulder Pain  . Chest Pain    HPI Peter Washington is a 51 y.o. male.  HPI Patient is a 51 year old male who has a history of DVT and pulmonary embolism and is currently presents the emergency department with sharp nonspecific chest pain without associated shortness of breath.  Reports bilateral shoulder pain as well.  No neck pain or radiation of the discomfort down his arms.  No neck pain or jaw pain.  He does have a history of prior cocaine abuse.  No history of cardiac disease.  He initially was noncompliant with his Xarelto and was seen in the emergency department on February 01, 2017 for chest pain.  At that time CT angiogram of his chest was performed and no evidence of pulmonary embolism was found.  He was prescribed Xarelto and given a short course by the pharmacy department here until he can get in with his primary care physician.  He is requesting more Xarelto today and hopefully some more medicine to go home with.  Denies exertional shortness of breath.  No fevers or chills.  Denies productive cough.  Denies abdominal pain.     Past Medical History:  Diagnosis Date  . DVT of lower extremity (deep venous thrombosis) ([facility])   . PE (pulmonary embolism)     Patient Active Problem List   Diagnosis Date Noted  . Cocaine abuse with cocaine-induced mood disorder ([facility]) 01/03/2017  . Acute pulmonary embolism ([facility]) 12/25/2016  . Depression 02/02/2016  . DVT (deep venous thrombosis) ([facility]) 02/02/2016  . Cocaine dependence, continuous ([facility]) 01/19/2016  . 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 History of pulmonary embolus (PE) 01/16/2014  . Tobacco use 01/16/2014  .  Protein C deficiency ([facility]) 12/31/2013    History reviewed. No pertinent surgical history.     Home Medications    Prior to Admission medications   Medication Sig Start Date End Date Taking? Authorizing Provider  gabapentin (NEURONTIN) 100 MG capsule Take 2 capsules (200 mg total) by mouth 2 (two) times daily. Patient not taking: Reported on 02/02/2017 01/03/17   Charm RingsLord, Jamison Y, NP  Rivaroxaban (XARELTO) 15 MG TABS tablet Take 1 tablet (15 mg total) by mouth 2 (two) times daily with a meal. 02/06/17   Azalia Bilisampos, Konica Stankowski, MD  rivaroxaban (XARELTO) 20 MG TABS tablet Take 20 mg by mouth daily with supper.    [provider]    Family History Family History  Problem Relation Age of Onset  . Diabetes Neg Hx   . Stroke Neg Hx   . Cancer Neg Hx   . Clotting disorder Neg Hx     Social History Social History   Tobacco Use  . Smoking status: Former Smoker    Packs/day: 0.25    Types: Cigarettes  . Smokeless tobacco: Never Used  . Tobacco comment: PATIENT STATES HE DO NOT SMOKE/SEEM TO NOT KNOW WHEN  HE STOPPED  Substance Use Topics  . Alcohol use: Yes    Comment: 3 beers on weekends  . Drug use: No     Allergies   Lactose intolerance (gi); Pork-derived products; and Tramadol   Review of Systems Review of Systems  All other systems reviewed and are negative.  Physical Exam Updated Vital Signs BP 106/65   Pulse (!) 56   Temp 97.8 F (36.6 C) (Oral)   Resp 18   SpO2 97%   Physical Exam  Constitutional: He is oriented to person, place, and time. He appears well-developed and well-nourished.  HENT:  Head: Normocephalic.  Eyes: EOM are normal.  Neck: Normal range of motion.  Cardiovascular: Normal rate and regular rhythm.  Pulmonary/Chest: Effort normal and breath sounds normal. No stridor. No respiratory distress. He has no wheezes. He has no rales. He exhibits no tenderness.  Abdominal: He exhibits no distension.  Musculoskeletal: Normal range of motion.  He exhibits no edema or tenderness.  Neurological: He is alert and oriented to person, place, and time.  Psychiatric: He has a normal mood and affect.  Nursing note and vitals reviewed.    ED Treatments / Results  Labs (all labs ordered are listed, but only abnormal results are displayed) Labs Reviewed - No data to display  EKG  EKG Interpretation  Date/Time:  Saturday February 05 2017 23:48:09 EST Ventricular Rate:  62 PR Interval:    QRS Duration: 79 QT Interval:  404 QTC Calculation: 411 R Axis:   37 Text Interpretation:  Sinus rhythm Borderline T wave abnormalities Confirmed by Zadie RhineWickline, Donald ([id number]) on 02/06/2017 10:53:25 AM       Radiology Dg Chest 2 View  Result Date: 02/06/2017 CLINICAL DATA:  Chest pain and left axillary and thoracic pain for 3 days. Former smoker. Previous history of pulmonary embolus. EXAM: CHEST  2 VIEW COMPARISON:  02/01/2017 FINDINGS: Slightly shallow inspiration. Linear atelectasis in the lung bases. Normal heart size and pulmonary vascularity. No focal airspace disease or consolidation in the lungs. No blunting of costophrenic angles. No pneumothorax. Mediastinal contours appear intact. Degenerative changes in the spine. IMPRESSION: Shallow inspiration with linear atelectasis in the lung bases. No focal consolidation. Electronically Signed   By: Burman NievesWilliam  Stevens M.D.   On: 02/06/2017 02:06    Procedures Procedures (including critical care time)  Medications Ordered in ED Medications  ibuprofen (ADVIL,MOTRIN) tablet 600 mg (600 mg Oral Given 02/06/17 0155)  acetaminophen (TYLENOL) tablet 650 mg (650 mg Oral Given 02/06/17 0155)     Initial Impression / Assessment and Plan / ED Course  I have reviewed the triage vital signs and the nursing notes.  Pertinent labs & imaging results that were available during my care of the patient were reviewed by me and considered in my medical decision making (see chart for details).    Nonspecific  chest pain.  Recent labs obtained demonstrate normal hemoglobin.  No indication for repeat labs at this time.  I suspect this is more musculoskeletal chest pain.  EKG without ischemic changes.  Doubt ACS.  Low suspicion for pulmonary embolism given his use of Xarelto and the recent CT scan which was reassuring.  Patient be given a prescription for Xarelto to be filled today.  Close primary care follow-up  Final Clinical Impressions(s) / ED Diagnoses   Final diagnoses:  Chest pain, unspecified type    ED Discharge Orders        Ordered    Rivaroxaban (XARELTO) 15 MG TABS tablet  2 times daily with meals     02/06/17 0315       Azalia Bilisampos, Allisen Pidgeon, MD 02/07/17 [phone]

## 2017-02-07 NOTE — Telephone Encounter (Signed)
Pt returning call-he pick up samples this afternoon.Criss AlvineGoldston, [place] Hert Cassady11/26/20182:00 PM

## 2017-02-07 NOTE — Telephone Encounter (Signed)
Rivaroxaban (XARELTO) 15 MG TABS tablet, refill request. Would like this med by today.

## 2017-02-10 ENCOUNTER — Ambulatory Visit: Payer: Self-pay | Admitting: Pharmacist

## 2017-02-10 DIAGNOSIS — I2699 Other pulmonary embolism without acute cor pulmonale: Secondary | ICD-10-CM

## 2017-02-10 MED ORDER — ENOXAPARIN SODIUM 120 MG/0.8ML ~~LOC~~ SOLN: 120.0000 mg | SUBCUTANEOUS | 0 refills | Status: DC

## 2017-02-10 NOTE — Progress Notes (Addendum)
Peter Washington is a 51 y.o. male who reports to the clinic for monitoring of rivaroxaban (Xarelto) therapy.    ASSESSMENT Indication(s): PE and DVT, protein C deficiency Duration: indefinite  Allergies  Allergen Reactions  . Lactose Intolerance (Gi) Diarrhea and Other (See Comments)    Causes acne, diarrhea and constipation  . Pork-Derived Products Nausea And Vomiting  . Tramadol Other (See Comments)    headache   Medication Sig  gabapentin (NEURONTIN) 100 MG capsule Take 2 capsules (200 mg total) by mouth 2 (two) times daily. Patient not taking: Reported on 02/02/2017  Rivaroxaban (XARELTO) 15 MG TABS tablet Take 1 tablet (15 mg total) by mouth 2 (two) times daily with a meal.  rivaroxaban (XARELTO) 20 MG TABS tablet Take 20 mg by mouth daily with supper.   Past Medical History:  Diagnosis Date  . DVT of lower extremity (deep venous thrombosis) ([facility])   . PE (pulmonary embolism)    Social History   Socioeconomic History  . Marital status: Single    Spouse name: Not on file  . Number of children: Not on file  . Years of education: Not on file  . Highest education level: Not on file  Social Needs  . Financial resource strain: Not on file  . Food insecurity - worry: Not on file  . Food insecurity - inability: Not on file  . Transportation needs - medical: Not on file  . Transportation needs - non-medical: Not on file  Occupational History  . Not on file  Tobacco Use  . Smoking status: Former Smoker    Packs/day: 0.25    Types: Cigarettes  . Smokeless tobacco: Never Used  . Tobacco comment: PATIENT STATES HE DO NOT SMOKE/SEEM TO NOT KNOW WHEN  HE STOPPED  Substance and Sexual Activity  . Alcohol use: Yes    Comment: 3 beers on weekends  . Drug use: No  . Sexual activity: Not on file  Other Topics Concern  . Not on file  Social History Narrative  . Not on file   Family History  Problem Relation Age of Onset  . Diabetes Neg Hx   . Stroke Neg Hx   . Cancer Neg Hx    . Clotting disorder Neg Hx    Labs: Component Value Date/Time   AST 63 (H) 01/01/2017 2359   ALT 60 01/01/2017 2359   NA 139 02/01/2017 2346   NA 143 01/14/2017 1655   K 3.9 02/01/2017 2346   CL 107 02/01/2017 2346   CO2 27 02/01/2017 2346   GLUCOSE 86 02/01/2017 2346   HGBA1C 5.5 01/13/2016 0625   BUN 25 (H) 02/01/2017 2346   BUN 21 01/14/2017 1655   CREATININE 1.25 (H) 02/01/2017 2346   CALCIUM 8.5 (L) 02/01/2017 2346   GFRNONAA >60 02/01/2017 2346   GFRAA >60 02/01/2017 2346   WBC 5.6 02/01/2017 2346   HGB 11.3 (L) 02/01/2017 2346   HGB 11.2 (L) 01/14/2017 1655   HCT 34.8 (L) 02/01/2017 2346   HCT 35.4 (L) 01/14/2017 1655   PLT 208 02/01/2017 2346   PLT 219 01/14/2017 1655   A/P Patient will be starting rifampin for latent TB therapy, which is recommended to avoid with DOAC therapy due to reduced efficacy. Discussed with attending and PCP. Patient started on enoxaparin 1.5 mg/kg/day. Will not initiate warfarin since patient has transportation barriers to INR monitoring (rifampin also interacts with warfarin). Instructed patient to stop Xarelto (plan to resume after completion of rifampin treatment).  Enoxaparin  was reviewed with the patient, including name, instructions, indication, goals of therapy, potential side effects, importance of adherence, and safe use.  Patient verbalized understanding by repeating back information. He states he has taken enoxaparin in the past. Patient was advised to contact me if further medication-related questions arise. Patient was also provided an information handout.  Marzetta BoardJennifer Kim Clinical Pharmacist  02/10/2017, 5:11 PM

## 2017-02-10 NOTE — Addendum Note (Signed)
Addended by: Mliss FritzKIM, Aundreya Souffrant J on: 02/10/2017 07:51 PM   Modules accepted: Orders

## 2017-02-21 ENCOUNTER — Ambulatory Visit: Payer: Self-pay

## 2017-02-24 ENCOUNTER — Ambulatory Visit (INDEPENDENT_AMBULATORY_CARE_PROVIDER_SITE_OTHER): Payer: Federal, State, Local not specified - Other | Admitting: Internal Medicine

## 2017-02-24 ENCOUNTER — Telehealth: Payer: Self-pay | Admitting: Pharmacist

## 2017-02-24 ENCOUNTER — Encounter: Payer: Self-pay | Admitting: Internal Medicine

## 2017-02-24 VITALS — BP 110/65 | HR 64 | Temp 98.2°F | Wt 163.9 lb

## 2017-02-24 DIAGNOSIS — Z86718 Personal history of other venous thrombosis and embolism: Secondary | ICD-10-CM

## 2017-02-24 DIAGNOSIS — Z8611 Personal history of tuberculosis: Secondary | ICD-10-CM | POA: Diagnosis not present

## 2017-02-24 DIAGNOSIS — D6859 Other primary thrombophilia: Secondary | ICD-10-CM | POA: Diagnosis not present

## 2017-02-24 DIAGNOSIS — Z86711 Personal history of pulmonary embolism: Secondary | ICD-10-CM

## 2017-02-24 DIAGNOSIS — Z7901 Long term (current) use of anticoagulants: Secondary | ICD-10-CM

## 2017-02-24 DIAGNOSIS — I824Y9 Acute embolism and thrombosis of unspecified deep veins of unspecified proximal lower extremity: Secondary | ICD-10-CM

## 2017-02-24 NOTE — Progress Notes (Signed)
   CC: Protein C deficiency, history of DVT/PE  HPI:  Peter Washington is a 51 y.o. M with PMHx listed below presenting for Protein C deficiency, history of DVT/PE. Please see the A&P for the status of the patient's chronic medical problems.  Patient presents to clinic today because he ran out of his Lovenox samples on Sunday and has not had a dose since. He has recently completed enrollment with the manufacturer for their assistance program and they should be providing his Lovenox for up to 1 year going forward. He is on anticoagulation for recurrent PE and DVT in the setting of protein C deficiency. He was previously on Xarelto, which was discontinued in anticipation of his upcoming treatment for latent TB with Rifadin which is a contraindication from use of DOAC's. He is unable to obtain transportation to have his INR checks performed if her were to be started on warfarin, and so has been initiated on Lovenox. He endorse mild shortness of breath and chest pain that he states is not uncommon for him.   Past Medical History:  Diagnosis Date  . DVT of lower extremity (deep venous thrombosis) ([facility])   . PE (pulmonary embolism)    Review of Systems:  Performed and negative except as otherwise indicated.  Physical Exam:  Vitals:   02/24/17 1449  BP: 110/65  Pulse: 64  Temp: 98.2 F (36.8 C)  TempSrc: Oral  SpO2: 98%  Weight: 163 lb 14.4 oz (74.3 kg)   Physical Exam  Constitutional: He appears well-developed and well-nourished.  Cardiovascular: Normal rate, regular rhythm and normal heart sounds.  Pulmonary/Chest: Effort normal and breath sounds normal. No respiratory distress.  Abdominal: Soft. Bowel sounds are normal. He exhibits no distension. There is no tenderness.  Musculoskeletal: He exhibits edema.     Assessment & Plan:   See Encounters Tab for problem based charting.  Patient seen with Dr. Oswaldo DoneVincent

## 2017-02-24 NOTE — Patient Instructions (Signed)
Thank you for allowing us to care for you.  You were provided with free samples of Lovenox to last you until you are able to get the medication from the manufacturer  The number to call to contact the manufacturer is [phone]  Please call the clinic if you have problems obtaining your medication that the manufacturer is unable to assist you with.

## 2017-02-24 NOTE — Progress Notes (Addendum)
Patient called to notify me he ran out of enoxaparin (Lovenox). He left this message on my voicemail. I tried calling back [phone], but no answer, so I left patient a message to call back.   Patient is enrolled in the free program for Lovenox, so unclear what the issue is. Patient will need to call [phone] for more information from the manufacturer patient assistance program ([employer]).  I can provide patient with samples in the meantime.

## 2017-02-24 NOTE — Assessment & Plan Note (Signed)
Patient presents to clinic today because he ran out of his Lovenox samples on Sunday and has not had a dose since. He has recently completed enrollment with the manufacturer for their assistance program and they should be providing his Lovenox for up to 1 year going forward. He is on anticoagulation for recurrent PE and DVT in the setting of protein C deficiency. He was previously on Xarelto, which was discontinued in anticipation of his upcoming treatment for latent TB with Rifadin which is a contraindication from use of DOAC's. He is unable to obtain transportation to have his INR checks performed if her were to be started on warfarin, and so has been initiated on Lovenox. - Continue Lovenox 1.5mg/kg/day, Provided samples today - Patient enrolled in manufacturer assistance program, provided with phone number to call 

## 2017-02-24 NOTE — Assessment & Plan Note (Signed)
Patient presents to clinic today because he ran out of his Lovenox samples on Sunday and has not had a dose since. He has recently completed enrollment with the manufacturer for their assistance program and they should be providing his Lovenox for up to 1 year going forward. He is on anticoagulation for recurrent PE and DVT in the setting of protein C deficiency. He was previously on Xarelto, which was discontinued in anticipation of his upcoming treatment for latent TB with Rifadin which is a contraindication from use of DOAC's. He is unable to obtain transportation to have his INR checks performed if her were to be started on warfarin, and so has been initiated on Lovenox. - Continue Lovenox 1.5mg /kg/day, Provided samples today - Patient enrolled in manufacturer assistance program, provided with phone number to call

## 2017-02-25 NOTE — Telephone Encounter (Signed)
Thanks Dr. Kim!

## 2017-02-25 NOTE — Progress Notes (Signed)
Internal Medicine Clinic Attending  I saw and evaluated the patient.  I personally confirmed the key portions of the history and exam documented by Dr. Melvin and I reviewed pertinent patient test results.  The assessment, diagnosis, and plan were formulated together and I agree with the documentation in the resident's note.  

## 2017-04-01 ENCOUNTER — Emergency Department (HOSPITAL_COMMUNITY): Payer: Self-pay

## 2017-04-01 ENCOUNTER — Encounter (HOSPITAL_COMMUNITY): Payer: Self-pay | Admitting: *Deleted

## 2017-04-01 ENCOUNTER — Emergency Department (HOSPITAL_COMMUNITY)
Admission: EM | Admit: 2017-04-01 | Discharge: 2017-04-01 | Disposition: A | Discharge status: Home / Self Care | Payer: Self-pay | Attending: Physician Assistant | Admitting: Physician Assistant

## 2017-04-01 ENCOUNTER — Other Ambulatory Visit: Payer: Self-pay

## 2017-04-01 DIAGNOSIS — Z87891 Personal history of nicotine dependence: Secondary | ICD-10-CM | POA: Insufficient documentation

## 2017-04-01 DIAGNOSIS — R0789 Other chest pain: Secondary | ICD-10-CM | POA: Insufficient documentation

## 2017-04-01 DIAGNOSIS — Z79899 Other long term (current) drug therapy: Secondary | ICD-10-CM | POA: Insufficient documentation

## 2017-04-01 LAB — D-DIMER, QUANTITATIVE: D-Dimer, Quant: <0.27 ug/mL-FEU (ref 0.00–0.50)

## 2017-04-01 LAB — CBC WITH DIFFERENTIAL/PLATELET
Basophils Absolute: 0 10*3/uL (ref 0.0–0.1)
Basophils Relative: 0 %
Eosinophils Absolute: 0.1 10*3/uL (ref 0.0–0.7)
Eosinophils Relative: 1 %
HCT: 38.3 % — ABNORMAL LOW (ref 39.0–52.0)
Hemoglobin: 12.6 g/dL — ABNORMAL LOW (ref 13.0–17.0)
Lymphocytes Relative: 31 %
Lymphs Abs: 1.6 10*3/uL (ref 0.7–4.0)
MCH: 26.4 pg (ref 26.0–34.0)
MCHC: 32.9 g/dL (ref 30.0–36.0)
MCV: 80.1 fL (ref 78.0–100.0)
Monocytes Absolute: 0.4 10*3/uL (ref 0.1–1.0)
Monocytes Relative: 8 %
Neutro Abs: 3 10*3/uL (ref 1.7–7.7)
Neutrophils Relative %: 60 %
Platelets: 194 10*3/uL (ref 150–400)
RBC: 4.78 MIL/uL (ref 4.22–5.81)
RDW: 14.3 % (ref 11.5–15.5)
WBC: 5.1 10*3/uL (ref 4.0–10.5)

## 2017-04-01 LAB — RAPID URINE DRUG SCREEN, HOSP PERFORMED
Amphetamines: NOT DETECTED
Barbiturates: NOT DETECTED
Benzodiazepines: NOT DETECTED
Cocaine: NOT DETECTED
Opiates: NOT DETECTED
Tetrahydrocannabinol: NOT DETECTED

## 2017-04-01 LAB — BASIC METABOLIC PANEL
Anion gap: 8 (ref 5–15)
BUN: 14 mg/dL (ref 6–20)
CO2: 23 mmol/L (ref 22–32)
Calcium: 9 mg/dL (ref 8.9–10.3)
Chloride: 109 mmol/L (ref 101–111)
Creatinine, Ser: 1.12 mg/dL (ref 0.61–1.24)
GFR calc Af Amer: >60 mL/min (ref >60)
GFR calc non Af Amer: >60 mL/min (ref >60)
Glucose, Bld: 138 mg/dL — ABNORMAL HIGH (ref 65–99)
Potassium: 3.7 mmol/L (ref 3.5–5.1)
Sodium: 140 mmol/L (ref 135–145)

## 2017-04-01 LAB — I-STAT TROPONIN, ED: Troponin i, poc: 0 ng/mL (ref 0.00–0.08)

## 2017-04-01 LAB — PROTIME-INR
INR: 1.02
Prothrombin Time: 13.3 seconds (ref 11.4–15.2)

## 2017-04-01 MED ORDER — LIDOCAINE 5 % EX PTCH: 1.0000 | MEDICATED_PATCH | CUTANEOUS | 0 refills | Status: AC

## 2017-04-01 MED ORDER — ASPIRIN 81 MG PO CHEW
324.0000 mg | CHEWABLE_TABLET | Freq: Once | ORAL | Status: AC
  Administered 2017-04-01: 324 mg via ORAL
  Filled 2017-04-01: qty 4

## 2017-04-01 MED ORDER — METHOCARBAMOL 500 MG PO TABS: 500.0000 mg | ORAL_TABLET | Freq: Two times a day (BID) | ORAL | 0 refills | Status: DC

## 2017-04-01 MED ORDER — LIDOCAINE 5 % EX PTCH
1.0000 | MEDICATED_PATCH | CUTANEOUS | Status: DC
  Administered 2017-04-01: 1 via TRANSDERMAL
  Filled 2017-04-01: qty 1

## 2017-04-01 MED ORDER — METHOCARBAMOL 500 MG PO TABS
500.0000 mg | ORAL_TABLET | Freq: Once | ORAL | Status: AC
Start: 2017-04-01 — End: 2017-04-01
  Administered 2017-04-01: 500 mg via ORAL
  Filled 2017-04-01: qty 1

## 2017-04-01 NOTE — ED Provider Notes (Signed)
[facility] Provider Note   CSN: [id number] Arrival date & time: 04/01/17  1449     History   Chief Complaint Chief Complaint  Patient presents with  . Chest Pain  . Cough  . Shortness of Breath    HPI Peter Washington is a 52 y.o. male.  52 year old male with history of DVT and pulmonary embolus, on chronic Lovenox, presents to the emergency department for evaluation of chest pain.  He reports 2 days of constant chest pain which is aggravated with palpation to his chest wall as well as when lifting his left arm.  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  He reports worsening pain with inspiration as well.  He has had a slight cough productive of mucus with "specks of blood" sporadically.  He has not had any known fevers, nausea, vomiting.  He denies missing any doses of his Lovenox.  Further denies any drug or alcohol use.      Past Medical History:  Diagnosis Date  . DVT of lower extremity (deep venous thrombosis) (HCC)   . PE (pulmonary embolism)     Patient Active Problem List   Diagnosis Date Noted  . Cocaine abuse with cocaine-induced mood disorder (HCC) 01/03/2017  . Acute pulmonary embolism (HCC) 12/25/2016  . Depression 02/02/2016  . DVT (deep venous thrombosis) (HCC) 02/02/2016  . Cocaine dependence, continuous (HCC) 01/19/2016  . 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 History of pulmonary embolus (PE) 01/16/2014  . Tobacco use 01/16/2014  . Protein C deficiency (HCC) 12/31/2013    History reviewed. No pertinent surgical history.     Home Medications    Prior to Admission medications   Medication Sig Start Date End Date Taking? Authorizing Provider  enoxaparin (LOVENOX) 120 MG/0.8ML injection Inject 0.8 mLs (120 mg total) into the  skin daily. Patient taking differently: Inject 120 mg into the skin at bedtime.  02/10/17  Yes Valentino NoseBoswell, Nathan, MD  ibuprofen (ADVIL,MOTRIN) 200 MG tablet Take 400 mg by mouth every 6 (six) hours as needed (pain).   Yes [provider]  isoniazid (NYDRAZID) 300 MG tablet Take 900 mg by mouth every Thursday.   Yes [provider]  pyridOXINE (VITAMIN B-6) 50 MG tablet Take 50 mg by mouth every Thursday.   Yes [provider]  rifampin (RIFADIN) 300 MG capsule Take 900 mg by mouth every Thursday.   Yes [provider]  gabapentin (NEURONTIN) 100 MG capsule Take 2 capsules (200 mg total) by mouth 2 (two) times daily. Patient not taking: Reported on 02/02/2017 01/03/17   Charm RingsLord, Jamison Y, NP  lidocaine (LIDODERM) 5 % Place 1 patch onto the skin daily. Remove & Discard patch within 12 hours or as directed by MD 04/01/17   Antony MaduraHumes, Akoni Parton, PA-C  methocarbamol (ROBAXIN) 500 MG tablet Take 1 tablet (500 mg total) by mouth 2 (two) times daily. 04/01/17   Antony MaduraHumes, Alannis Hsia, PA-C    Family History Family History  Problem Relation Age of Onset  . Diabetes Neg Hx   . Stroke Neg Hx   . Cancer Neg Hx   . Clotting disorder Neg Hx     Social History Social History   Tobacco Use  . Smoking status: Former Smoker    Packs/day: 0.25    Types: Cigarettes  . Smokeless tobacco: Never  Used  . Tobacco comment: PATIENT STATES HE DO NOT SMOKE/SEEM TO NOT KNOW WHEN  HE STOPPED  Substance Use Topics  . Alcohol use: Yes    Comment: 3 beers on weekends  . Drug use: No     Allergies   Lactose intolerance (gi); Pork-derived products; and Tramadol   Review of Systems Review of Systems Ten systems reviewed and are negative for acute change, except as noted in the HPI.    Physical Exam Updated Vital Signs BP 114/70   Pulse (!) 54   Temp 98.8 F (37.1 C) (Oral)   Resp 19   SpO2 100%   Physical Exam  Constitutional: He is oriented to person, place, and time. He appears  well-developed and well-nourished. No distress.  Nontoxic appearing and in NAD  HENT:  Head: Normocephalic and atraumatic.  Eyes: Conjunctivae and EOM are normal. No scleral icterus.  Neck: Normal range of motion.  No JVD  Cardiovascular: Normal rate, regular rhythm and intact distal pulses.  Pulmonary/Chest: Effort normal. No stridor. No respiratory distress. He has no wheezes. He exhibits tenderness.  Lungs CTAB. No tachypnea or dyspnea. Focal TTP under left pectoral muscle. No bony deformity, crepitus.    Musculoskeletal: Normal range of motion.  Neurological: He is alert and oriented to person, place, and time. He exhibits normal muscle tone. Coordination normal.  GCS 15.  Patient moving all extremities.  Skin: Skin is warm and dry. No rash noted. He is not diaphoretic. No erythema. No pallor.  Psychiatric: He has a normal mood and affect. His behavior is normal.  Nursing note and vitals reviewed.    ED Treatments / Results  Labs (all labs ordered are listed, but only abnormal results are displayed) Labs Reviewed  CBC WITH DIFFERENTIAL/PLATELET - Abnormal; Notable for the following components:      Result Value   Hemoglobin 12.6 (*)    HCT 38.3 (*)    All other components within normal limits  BASIC METABOLIC PANEL - Abnormal; Notable for the following components:   Glucose, Bld 138 (*)    All other components within normal limits  RAPID URINE DRUG SCREEN, HOSP PERFORMED  PROTIME-INR  D-DIMER, QUANTITATIVE (NOT AT [facility])  I-STAT TROPONIN, ED    EKG  EKG Interpretation None       Radiology Dg Chest 2 View  Result Date: 04/01/2017 CLINICAL DATA:  Sharp chest pain radiating to LEFT shoulder for 2 days. Shortness of breath and cough. EXAM: CHEST  2 VIEW COMPARISON:  Chest radiograph February 06, 2017 FINDINGS: Increasing bibasilar strandy densities. The cardiac silhouette is upper limits of normal in size, mediastinal silhouette is not suspicious. No pleural effusion.  No pneumothorax. Moderate degenerative change of the thoracic spine. Cervical facet arthropathy. IMPRESSION: Increasing bibasilar atelectasis/scarring. Borderline cardiomegaly. Electronically Signed   By: Awilda Metro M.D.   On: 04/01/2017 15:44    Procedures Procedures (including critical care time)  Medications Ordered in ED Medications  lidocaine (LIDODERM) 5 % 1 patch (1 patch Transdermal Patch Applied 04/01/17 2215)  aspirin chewable tablet 324 mg (324 mg Oral Given 04/01/17 2213)  methocarbamol (ROBAXIN) tablet 500 mg (500 mg Oral Given 04/01/17 2215)     Initial Impression / Assessment and Plan / ED Course  I have reviewed the triage vital signs and the nursing notes.  Pertinent labs & imaging results that were available during my care of the patient were reviewed by me and considered in my medical decision making (see chart for details).  52 year old male presents to the emergency department for evaluation of 2 days of constant chest pain.  This is reproducible on palpation to the chest wall.  He also notes worsening pain with inspiration as well as movement of his left upper extremity.  No associated shortness of breath, nausea, vomiting.  Cardiac workup today has been reassuring.  EKG without signs of acute ischemia.  Chest x-ray also without pneumothorax, pleural effusion, pneumonia, or mediastinal widening.  Recurrent pulmonary embolus considered; however, patient has a negative d-dimer today.  He has no hypoxia or tachycardia.  He reports full compliance with his Lovenox.  PE considered less likely in this scenario.  Given reproducibility of symptoms as well as aggravation with movement and inspiration, muscle strain suspected.  Will tx with Lidoderm patch and Robaxin PRN.  Patient advised to follow-up with the internal medicine clinic for further evaluation of symptoms.  Just prior to discharge, patient requesting a work note specifying need for bedrest.  I have explained  that this is not indicated.  He has been given a work note.  Return precautions discussed and provided. Patient discharged in stable condition with no unaddressed concerns.   Final Clinical Impressions(s) / ED Diagnoses   Final diagnoses:  Chest wall pain    ED Discharge Orders        Ordered    methocarbamol (ROBAXIN) 500 MG tablet  2 times daily     04/01/17 2235    lidocaine (LIDODERM) 5 %  Every 24 hours     04/01/17 2235       Antony Madura, PA-C 04/01/17 2310    Abelino Derrick, MD 04/07/17 1610

## 2017-04-01 NOTE — ED Triage Notes (Signed)
Pt reports sharp left side chest pains for several days. Pt has sob and recent productive cough, reports coughing up blood. Hx of PE and is currently on lovenox.

## 2017-04-01 NOTE — ED Provider Notes (Signed)
Patient placed in Quick Look pathway, seen and evaluated   Chief Complaint: chest pain  HPI:   52 year old male presents today with 2 days of left chest pain.  He notes this is sharp in nature worse with inspiration, worse with movement, worse with palpation.  He can localize it with one finger on his left anterior chest wall right below his pectoralis muscle.  He reports pain with range of motion of the shoulder as well.  He reports some nausea denies any vomiting.  He reports productive cough.  He denies any drug or alcohol use denies any cardiac history.  He does note a history of PE currently on Lovenox.   ROS: Positive for chest pain (one)  Physical Exam:   Gen: No distress  Neuro: Awake and Alert  Skin: Warm    Focused Exam: Cardiac: Regular rate and rhythm, no murmurs, pulmonary: Lungs clear bilateral, exquisite tenderness to palpation of left lower anterior chest wall, no rashes   Initiation of care has begun. The patient has been counseled on the process, plan, and necessity for staying for the completion/evaluation, and the remainder of the medical screening examination   Eyvonne MechanicHedges, Alem Fahl, Cordelia Poche-C 04/01/17 1501    Wynetta FinesMessick, Peter C, MD 04/02/17 1352

## 2017-04-01 NOTE — Discharge Instructions (Signed)
We believe that your chest pain is due to a muscle strain or spasm.  Your workup today in the emergency department was reassuring and did not reveal a concerning cause of your chest pain.  Follow-up with the internal medicine clinic for further evaluation of your symptoms.  You may return as needed for new or concerning symptoms.

## 2017-04-24 ENCOUNTER — Emergency Department (HOSPITAL_COMMUNITY): Payer: Self-pay

## 2017-04-24 ENCOUNTER — Encounter (HOSPITAL_COMMUNITY): Payer: Self-pay | Admitting: Emergency Medicine

## 2017-04-24 ENCOUNTER — Emergency Department (HOSPITAL_COMMUNITY)
Admission: EM | Admit: 2017-04-24 | Discharge: 2017-04-24 | Disposition: A | Discharge status: Home / Self Care | Payer: Self-pay | Attending: Emergency Medicine | Admitting: Emergency Medicine

## 2017-04-24 ENCOUNTER — Other Ambulatory Visit: Payer: Self-pay

## 2017-04-24 DIAGNOSIS — F1420 Cocaine dependence, uncomplicated: Secondary | ICD-10-CM | POA: Insufficient documentation

## 2017-04-24 DIAGNOSIS — M7542 Impingement syndrome of left shoulder: Secondary | ICD-10-CM | POA: Insufficient documentation

## 2017-04-24 DIAGNOSIS — M25512 Pain in left shoulder: Secondary | ICD-10-CM

## 2017-04-24 DIAGNOSIS — Z79899 Other long term (current) drug therapy: Secondary | ICD-10-CM | POA: Insufficient documentation

## 2017-04-24 DIAGNOSIS — Z87891 Personal history of nicotine dependence: Secondary | ICD-10-CM | POA: Insufficient documentation

## 2017-04-24 DIAGNOSIS — F329 Major depressive disorder, single episode, unspecified: Secondary | ICD-10-CM | POA: Insufficient documentation

## 2017-04-24 MED ORDER — METHOCARBAMOL 500 MG PO TABS: 500.0000 mg | ORAL_TABLET | Freq: Two times a day (BID) | ORAL | 0 refills | Status: AC

## 2017-04-24 MED ORDER — DICLOFENAC SODIUM 1 % TD GEL: 2.0000 g | Freq: Four times a day (QID) | TRANSDERMAL | 0 refills | Status: AC

## 2017-04-24 MED ORDER — DICLOFENAC SODIUM 1 % TD GEL
2.0000 g | Freq: Four times a day (QID) | TRANSDERMAL | Status: DC
  Administered 2017-04-24: 22:00:00 2 g via TOPICAL
  Filled 2017-04-24: qty 100

## 2017-04-24 NOTE — ED Notes (Signed)
Patient transported to X-ray 

## 2017-04-24 NOTE — ED Triage Notes (Signed)
Pt reports his left arm pain has not gotten any better, was seen here three weeks ago and he feels "you did nothing for the pain."  Reports he is unable to lift arm.  Describes the pain as a 10/10 pain aching.

## 2017-04-24 NOTE — Discharge Instructions (Signed)
Apply Voltaren gel twice a day for pain.  Take Robaxin as prescribed as needed for muscle spasms.  Keep your arm in the sling, avoid any strenuous activity.  See set of exercises attached.  Follow-up with family doctor.

## 2017-04-24 NOTE — ED Notes (Signed)
Sling applied to left arm.

## 2017-04-24 NOTE — ED Provider Notes (Signed)
[facility] EMERGENCY DEPARTMENT Provider Note   CSN: [id number] Arrival date & time: 04/24/17  1846     History   Chief Complaint Chief Complaint  Patient presents with  . Arm Pain    HPI Peter Washington is a 52 y.o. male.  HPI Peter Washington is a 52 y.o. male with history of polysubstance abuse, with cocaine dependence, history of PE, DVT, currently on Lovenox, presents to emergency department with complaint of left shoulder pain.  He states he has had shoulder pain for some time.  He was seen here on 04/01/17, and states he mentioned this shoulder pain then but states that we did not address it.  He was treated at that time for chest wall pain which is now better.  He was discharged home with Robaxin and lidocaine patch.  He states these medications did not help his shoulder.  He states pain in his shoulder is worsened with movement of the joint.  He denies any numbness or weakness distally to his hand.  He states he is unable to use the arm.  He states that pain is worsened when he sleeps on his back.  He states he unable to sleep and wakes up multiple times during the night due to pain.  He denies any injuries.  He denies any fever or chills.  No history of shoulder issues.  Past Medical History:  Diagnosis Date  . DVT of lower extremity (deep venous thrombosis) (HCC)   . PE (pulmonary embolism)     Patient Active Problem List   Diagnosis Date Noted  . Cocaine abuse with cocaine-induced mood disorder (HCC) 01/03/2017  . Acute pulmonary embolism (HCC) 12/25/2016  . Depression 02/02/2016  . DVT (deep venous thrombosis) (HCC) 02/02/2016  . Cocaine dependence, continuous (HCC) 01/19/2016  . 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 History of pulmonary embolus (PE) 01/16/2014  . Tobacco use 01/16/2014  . Protein C deficiency (HCC) 12/31/2013      History reviewed. No pertinent surgical history.     Home Medications    Prior to Admission medications   Medication Sig Start Date End Date Taking? Authorizing Provider  enoxaparin (LOVENOX) 120 MG/0.8ML injection Inject 0.8 mLs (120 mg total) into the skin daily. Patient taking differently: Inject 120 mg into the skin at bedtime.  02/10/17   Valentino Nose, MD  gabapentin (NEURONTIN) 100 MG capsule Take 2 capsules (200 mg total) by mouth 2 (two) times daily. Patient not taking: Reported on 02/02/2017 01/03/17   Charm Rings, NP  ibuprofen (ADVIL,MOTRIN) 200 MG tablet Take 400 mg by mouth every 6 (six) hours as needed (pain).    [provider]  isoniazid (NYDRAZID) 300 MG tablet Take 900 mg by mouth every Thursday.    [provider]  lidocaine (LIDODERM) 5 % Place 1 patch onto the skin daily. Remove & Discard patch within 12 hours or as directed by MD 04/01/17   Antony Madura, PA-C  methocarbamol (ROBAXIN) 500 MG tablet Take 1 tablet (500 mg total) by mouth 2 (two) times daily. 04/01/17   Antony Madura, PA-C  pyridOXINE (VITAMIN B-6) 50 MG tablet Take 50 mg by mouth every Thursday.    [provider]  rifampin (RIFADIN) 300 MG capsule Take 900 mg by mouth every Thursday.    [provider]    Family History Family History  Problem Relation Age of Onset  .  Diabetes Neg Hx   . Stroke Neg Hx   . Cancer Neg Hx   . Clotting disorder Neg Hx     Social History Social History   Tobacco Use  . Smoking status: Former Smoker    Packs/day: 0.25    Types: Cigarettes  . Smokeless tobacco: Never Used  . Tobacco comment: PATIENT STATES HE DO NOT SMOKE/SEEM TO NOT KNOW WHEN  HE STOPPED  Substance Use Topics  . Alcohol use: Yes    Comment: 3 beers on weekends  . Drug use: No     Allergies   Lactose intolerance (gi); Pork-derived products; and Tramadol   Review of Systems Review of Systems  Constitutional: Negative for chills and fever.   Respiratory: Negative for cough, chest tightness and shortness of breath.   Cardiovascular: Negative for chest pain, palpitations and leg swelling.  Gastrointestinal: Negative for abdominal distention, abdominal pain, diarrhea, nausea and vomiting.  Genitourinary: Negative for dysuria, frequency, hematuria and urgency.  Musculoskeletal: Positive for arthralgias. Negative for myalgias, neck pain and neck stiffness.  Skin: Negative for rash.  Allergic/Immunologic: Negative for immunocompromised state.  Neurological: Negative for dizziness, weakness, light-headedness, numbness and headaches.  All other systems reviewed and are negative.    Physical Exam Updated Vital Signs BP 113/71 (BP Location: Right Arm)   Pulse 61   Temp 97.8 F (36.6 C) (Oral)   Resp 16   Ht 5\' 7"  (1.702 m)   Wt 72.1 kg (159 lb)   SpO2 100%   BMI 24.90 kg/m   Physical Exam  Constitutional: He appears well-developed and well-nourished. No distress.  HENT:  Head: Normocephalic and atraumatic.  Eyes: Conjunctivae are normal.  Neck: Neck supple.  Cardiovascular: Normal rate, regular rhythm and normal heart sounds.  Pulmonary/Chest: Effort normal. No respiratory distress. He has no wheezes. He has no rales.  Musculoskeletal: He exhibits no edema.  Normal-appearing left shoulder.  Tenderness to palpation of the lateral and posterior shoulder joint.  Unable to actively raise his arm at the shoulder joint.  Passive range of motion is limited due to pain, however, able to raise his arm greater than 90 degrees with flexion and abduction.  Pain with internal and external rotation.  Normal bicep tendon.  No tenderness at the bicep attachment or insertion points.  Bicep strength is intact.  Distal radial pulses are intact and equal bilaterally.  Neurological: He is alert.  Skin: Skin is warm and dry.  Nursing note and vitals reviewed.    ED Treatments / Results  Labs (all labs ordered are listed, but only abnormal  results are displayed) Labs Reviewed - No data to display  EKG  EKG Interpretation None       Radiology Dg Shoulder Left  Result Date: 04/24/2017 CLINICAL DATA:  Worsening left shoulder pain over the past 3 weeks without known trauma. EXAM: LEFT SHOULDER - 2+ VIEW COMPARISON:  None. FINDINGS: Slight lateral downsloping of the acromion which may cause impingement type symptoms upon the rotator cuff with shoulder abduction. No fracture or joint dislocations. The included left ribs and lung are nonacute. No soft tissue mass or mineralization. IMPRESSION: Slight lateral downsloping is suggested of the acromion which may contribute to impingement type symptoms with shoulder abduction. No acute osseous abnormality. Electronically Signed   By: Tollie Eth M.D.   On: 04/24/2017 20:46    Procedures Procedures (including critical care time)  Medications Ordered in ED Medications - No data to display   Initial Impression / Assessment  and Plan / ED Course  I have reviewed the triage vital signs and the nursing notes.  Pertinent labs & imaging results that were available during my care of the patient were reviewed by me and considered in my medical decision making (see chart for details).     Patient with atraumatic pain to his left shoulder.  Most likely muscular given that he has severe pain when he is moving that arm, but pain is much better with passive range of motion.  He has not had any imaging of that shoulder, will get x-rays.  8:58 PM X-ray of the shoulder suggesting possible impingement syndrome.  I discussed results with patient.  Will place in a sling temporarily.  Will treat with Voltaren gel, Robaxin.  Advised to follow-up with a family doctor.  Advised not to use left arm for any strenuous activity.  Return  Precautions discussed.   Vitals:   04/24/17 1913 04/24/17 1914  BP: 113/71   Pulse: 61   Resp: 16   Temp: 97.8 F (36.6 C)   TempSrc: Oral   SpO2: 100%   Weight:   72.1 kg (159 lb)  Height:  5\' 7"  (1.702 m)     Final Clinical Impressions(s) / ED Diagnoses   Final diagnoses:  None    ED Discharge Orders    None       Jaynie CrumbleKirichenko, Terressa Evola, PA-C 04/24/17 2100    Phillis HaggisMabe, Martha L, MD 04/24/17 2101

## 2017-04-26 ENCOUNTER — Encounter: Payer: Self-pay | Admitting: Pediatric Intensive Care

## 2017-04-26 MED FILL — METHOCARBAMOL 500 MG TABS: 500 | 10 days supply | Qty: 20 | Fill #0

## 2017-04-26 MED FILL — DICLOFENAC SODIUM 1% GEL: 1 | 20 days supply | Qty: 100 | Fill #0

## 2017-04-27 ENCOUNTER — Ambulatory Visit (INDEPENDENT_AMBULATORY_CARE_PROVIDER_SITE_OTHER): Payer: Self-pay | Admitting: Orthopedic Surgery

## 2017-04-27 ENCOUNTER — Encounter (INDEPENDENT_AMBULATORY_CARE_PROVIDER_SITE_OTHER): Payer: Self-pay | Admitting: Orthopedic Surgery

## 2017-04-27 DIAGNOSIS — M19019 Primary osteoarthritis, unspecified shoulder: Secondary | ICD-10-CM

## 2017-04-27 MED ORDER — MELOXICAM 15 MG PO TABS: ORAL_TABLET | ORAL | 0 refills | Status: DC

## 2017-04-30 ENCOUNTER — Encounter (INDEPENDENT_AMBULATORY_CARE_PROVIDER_SITE_OTHER): Payer: Self-pay | Admitting: Orthopedic Surgery

## 2017-04-30 DIAGNOSIS — M19019 Primary osteoarthritis, unspecified shoulder: Secondary | ICD-10-CM

## 2017-04-30 MED ORDER — METHYLPREDNISOLONE ACETATE 40 MG/ML IJ SUSP
40.0000 mg | INTRAMUSCULAR | Status: AC | PRN
  Administered 2017-04-30: 40 mg via INTRA_ARTICULAR

## 2017-04-30 MED ORDER — BUPIVACAINE HCL 0.5 % IJ SOLN
9.0000 mL | INTRAMUSCULAR | Status: AC | PRN
  Administered 2017-04-30: 9 mL via INTRA_ARTICULAR

## 2017-04-30 MED ORDER — LIDOCAINE HCL 1 % IJ SOLN
5.0000 mL | INTRAMUSCULAR | Status: AC | PRN
  Administered 2017-04-30: 5 mL

## 2017-04-30 NOTE — Progress Notes (Signed)
Office Visit Note   Patient: Peter Washington           Date of Birth: 01/26/1966           MRN: [id number] Visit Date: 04/27/2017 Requested by: Valentino NoseBoswell, Nathan, MD [address] [place], [place] [id number] PCP: Valentino NoseBoswell, Nathan, MD  Subjective: Chief Complaint  Patient presents with  . Left Shoulder - Pain    HPI: Peter Washington Bottomlvin is a patient with left shoulder pain.  Been ongoing for 3 weeks.  History of injury.  He states I woke up with it and it was whacked out he is right-hand dominant.  He reports constant pain.  Is been to the emergency room twice for pain.  Radiographs on the 10 system demonstrate narrowing of the acromiohumeral distance.  He has tried Robaxin with no relief.  Reports some pain radiating down the arm but no numbness and tingling and no neck pain.  He used to do a lot of boxing and weight lifting.  He reports a 20-year history of some type of shoulder pain on the left he does [employer].  He has not worked in 3 weeks.  Patient does have a history of pulmonary embolism and he therefore cannot take routine anti-inflammatories.              ROS: All systems reviewed are negative as they relate to the chief complaint within the history of present illness.  Patient denies  fevers or chills.   Assessment & Plan: Visit Diagnoses:  1. Shoulder arthritis     Plan: Impression is left shoulder pain with possible rotator cuff pathology bursitis.  Plan for now is subacromial injection.  After further imaging if his symptoms do not improve.  His strength is excellent today.  Follow-up in 4-6 weeks if he is not improved.  Follow-Up Instructions: Return if symptoms worsen or fail to improve.   Orders:  No orders of the defined types were placed in this encounter.  Meds ordered this encounter  Medications  . DISCONTD: meloxicam (MOBIC) 15 MG tablet    Sig: 1 po q d prn pain x 3 weeks    Dispense:  21 tablet    Refill:  0      Procedures: Large Joint Inj: L subacromial bursa on  04/30/2017 9:40 PM Indications: diagnostic evaluation and pain Details: 18 G 1.5 in needle, posterior approach  Arthrogram: No  Medications: 9 mL bupivacaine 0.5 %; 40 mg methylPREDNISolone acetate 40 MG/ML; 5 mL lidocaine 1 % Outcome: tolerated well, no immediate complications Procedure, treatment alternatives, risks and benefits explained, specific risks discussed. Consent was given by the patient. Immediately prior to procedure a time out was called to verify the correct patient, procedure, equipment, support staff and site/side marked as required. Patient was prepped and draped in the usual sterile fashion.       Clinical Data: No additional findings.  Objective: Vital Signs: There were no vitals taken for this visit.  Physical Exam:   Constitutional: Patient appears well-developed HEENT:  Head: Normocephalic Eyes:EOM are normal Neck: Normal range of motion Cardiovascular: Normal rate Pulmonary/chest: Effort normal Neurologic: Patient is alert Skin: Skin is warm Psychiatric: Patient has normal mood and affect    Ortho Exam: Orthopedic exam demonstrates good cervical spine range of motion good active and passive left shoulder range of motion with some coarseness with passive range of motion.  No acromioclavicular joint tenderness is noted.  No restriction of passive range of motion on the left-hand side.  No masses lymphadenopathy or skin changes noted in the shoulder girdle region.  Rotator cuff strength is actually pretty good to infraspinatus supraspinatus and subscap muscle testing.  O'Brien's testing negative negative apprehension relocation testing no pain with crossarm adduction  Specialty Comments:  No specialty comments available.  Imaging: No results found.   PMFS History: Patient Active Problem List   Diagnosis Date Noted  . Cocaine abuse with cocaine-induced mood disorder (HCC) 01/03/2017  . Acute pulmonary embolism (HCC) 12/25/2016  . Depression  02/02/2016  . DVT (deep venous thrombosis) (HCC) 02/02/2016  . Cocaine dependence, continuous (HCC) 01/19/2016  . 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 History of pulmonary embolus (PE) 01/16/2014  . Tobacco use 01/16/2014  . Protein C deficiency (HCC) 12/31/2013   Past Medical History:  Diagnosis Date  . DVT of lower extremity (deep venous thrombosis) (HCC)   . PE (pulmonary embolism)     Family History  Problem Relation Age of Onset  . Diabetes Neg Hx   . Stroke Neg Hx   . Cancer Neg Hx   . Clotting disorder Neg Hx     History reviewed. No pertinent surgical history. Social History   Occupational History  . Not on file  Tobacco Use  . Smoking status: Former Smoker    Packs/day: 0.25    Types: Cigarettes  . Smokeless tobacco: Never Used  . Tobacco comment: PATIENT STATES HE DO NOT SMOKE/SEEM TO NOT KNOW WHEN  HE STOPPED  Substance and Sexual Activity  . Alcohol use: Yes    Comment: 3 beers on weekends  . Drug use: No  . Sexual activity: Not on file

## 2017-05-04 ENCOUNTER — Other Ambulatory Visit: Payer: Self-pay

## 2017-05-04 ENCOUNTER — Encounter: Payer: Self-pay | Admitting: Internal Medicine

## 2017-05-04 ENCOUNTER — Ambulatory Visit (INDEPENDENT_AMBULATORY_CARE_PROVIDER_SITE_OTHER): Payer: Self-pay | Admitting: Internal Medicine

## 2017-05-04 VITALS — BP 126/57 | HR 70 | Temp 97.8°F | Ht 67.0 in | Wt 159.5 lb

## 2017-05-04 DIAGNOSIS — H539 Unspecified visual disturbance: Secondary | ICD-10-CM | POA: Insufficient documentation

## 2017-05-04 DIAGNOSIS — Z720 Tobacco use: Secondary | ICD-10-CM

## 2017-05-04 DIAGNOSIS — Z0000 Encounter for general adult medical examination without abnormal findings: Secondary | ICD-10-CM

## 2017-05-04 DIAGNOSIS — F17210 Nicotine dependence, cigarettes, uncomplicated: Secondary | ICD-10-CM

## 2017-05-04 DIAGNOSIS — T6591XS Toxic effect of unspecified substance, accidental (unintentional), sequela: Secondary | ICD-10-CM

## 2017-05-04 DIAGNOSIS — H53143 Visual discomfort, bilateral: Secondary | ICD-10-CM

## 2017-05-04 DIAGNOSIS — Z86718 Personal history of other venous thrombosis and embolism: Secondary | ICD-10-CM

## 2017-05-04 DIAGNOSIS — D6859 Other primary thrombophilia: Secondary | ICD-10-CM

## 2017-05-04 DIAGNOSIS — Z7901 Long term (current) use of anticoagulants: Secondary | ICD-10-CM

## 2017-05-04 DIAGNOSIS — Z86711 Personal history of pulmonary embolism: Secondary | ICD-10-CM

## 2017-05-04 DIAGNOSIS — A159 Respiratory tuberculosis unspecified: Secondary | ICD-10-CM

## 2017-05-04 NOTE — Assessment & Plan Note (Signed)
Parotid fecal occult immunochemical testing kit today.  He declined flu shot.  He reports that he believes he had a tetanus booster 5 years ago in [place] and is not interested in a booster today.

## 2017-05-04 NOTE — Assessment & Plan Note (Signed)
He reports that when he was younger he had a chemical exposure to his eyes and has had vision problems since that time.  He has difficulty seeing distances as well as with reading.  He would like formal evaluation of his eyes as he is trying to obtain a license.  Will refer to ophthalmology for formal evaluation.

## 2017-05-04 NOTE — Addendum Note (Signed)
Addended by: Hollie SalkBOSWELL, Aayliah Rotenberry L on: 05/04/2017 05:40 PM   Modules accepted: Orders

## 2017-05-04 NOTE — Progress Notes (Signed)
   CC: Follow-up for his protein C deficiency and history of DVT/PE  HPI:  Mr.Ioane Marina Goodellerry is a 52 y.o. male with a past medical history listed below here today for follow up of his protein C deficiency as well as his history of DVT/PE complicated by treatment for latent tuberculosis.  For details of today's visit and the status of his chronic medical issues please refer to the assessment and plan.   Past Medical History:  Diagnosis Date  . DVT of lower extremity (deep venous thrombosis) (HCC)   . PE (pulmonary embolism)    Review of Systems:   No chest pain or shortness of breath.  Physical Exam:  Vitals:   05/04/17 1332  BP: (!) 126/57  Pulse: 70  Temp: 97.8 F (36.6 C)  TempSrc: Oral  SpO2: 100%  Weight: 159 lb 8 oz (72.3 kg)  Height: 5\' 7"  (1.702 m)   GENERAL- alert, co-operative, appears as stated age, not in any distress. HEENT- Atraumatic, normocephalic, PERRL, EOMI, oral mucosa appears moist CARDIAC- RRR, no murmurs, rubs or gallops. RESP- Moving equal volumes of air, and clear to auscultation bilaterally, no wheezes or crackles. ABDOMEN- Soft, nontender, bowel sounds present. NEURO- No obvious Cr N abnormality. EXTREMITIES- pulse 2+, symmetric, no pedal edema. SKIN- Warm, dry, No rash or lesion. PSYCH- Normal mood and affect, appropriate thought content and speech.   Assessment & Plan:   See Encounters Tab for problem based charting.  Patient discussed with Dr. Cleda DaubE. Hoffman

## 2017-05-04 NOTE — Patient Instructions (Signed)
Peter Washington,  Please follow up with Dr. Selena BattenKim in a month to get your Xarelto.  Please follow up with me in 6 months.

## 2017-05-04 NOTE — Assessment & Plan Note (Addendum)
Reports that he smokes 5 cigarettes a day.  He has not tried anything to help quit in the past.  He is not interested in anything to help him quit currently.  Discussed quitting strategies with him today including setting a quit date.  He says he wants to try this before trying any medications.  Encouraged cessation.

## 2017-05-04 NOTE — Assessment & Plan Note (Signed)
He reports that he is been able to get his Lovenox samples without issues since his last visit.  He wants to know if he would need to be on lifelong anticoagulation and discussed that given his history of PE and DVT with his protein C deficiency that he would need lifelong anticoagulation.  He is on week 9 of 12 of tuberculosis treatment. He would like to get back on Xarelto once he is finished with it.  Assessment and plan: Continue Lovenox until he is able to tuberculosis treatment therapy.  Will have patient follow-up with Dr. Selena BattenKim and a month to transition over to Xarelto.

## 2017-05-05 ENCOUNTER — Encounter: Payer: Self-pay | Admitting: Internal Medicine

## 2017-05-05 NOTE — Progress Notes (Signed)
Internal Medicine Clinic Attending  Case discussed with Dr. Boswell at the time of the visit.  We reviewed the resident's history and exam and pertinent patient test results.  I agree with the assessment, diagnosis, and plan of care documented in the resident's note.  

## 2017-05-21 NOTE — Congregational Nurse Program (Signed)
Congregational Nurse Program Note  Date of Encounter: 04/26/2017  Past Medical History: Past Medical History:  Diagnosis Date  . DVT of lower extremity (deep venous thrombosis) (HCC)   . PE (pulmonary embolism)     Encounter Details: CNP Questionnaire - 04/26/17 1030      Questionnaire   Patient Status  Not Applicable    Race  Black or African American    [facility] Patient Served At  [facility]    Insurance  Not Applicable    Uninsured  Uninsured (NEW 1x/quarter)    Food  No food insecurities    Housing/Utilities  No permanent housing    Transportation  No transportation needs    Interpersonal Safety  Yes, feel physically and emotionally safe where you currently live    Medication  Yes, have medication insecurities;Provided medication assistance    Medical Provider  Yes    Referrals  Primary Care Provider/Clinic;Other    ED Visit Averted  Not Applicable    Life-Saving Intervention Made  Not Applicable      Clinical Intake - 05/04/17 1334      Pre-visit preparation   Pre-visit preparation completed  No      Pain   Pain   No/denies pain      Nutrition Screen   BMI - recorded  24.98    Nutritional Status  BMI of 19-24  Normal    Nutritional Risks  None    Diabetes  No      Functional Status   Activities of Daily Living  Independent    Ambulation  Independent    Medication Administration  Independent    Home Management  Independent      Risk/Barriers   Barriers to Care Management & Learning  None      Abuse/Neglect   Do you feel unsafe in your current relationship?  No    Do you feel physically threatened by others?  No    Anyone hurting you at home, work, or school?  No    Unable to ask?  No      Patient Literacy   How often do you need to have someone help you when you read instructions, pamphlets, or other written materials from your doctor or pharmacy?  1 - Never    What is the last grade level you completed in school?  12th Grade       Language Assistant   Interpreter Needed?  No      Comments   Information entered by :  Angelina OkGladys Herbin, rN 05/04/2017 1:36 PM      Client has prescriptions from recent ED visit. CN will fill. Client states he has follow up appointment arranged with ortho and PCP.

## 2017-06-08 NOTE — Addendum Note (Signed)
Addended by: Neomia DearPOWERS, Shloimy Michalski E on: 06/08/2017 06:37 PM   Modules accepted: Orders

## 2017-07-07 ENCOUNTER — Other Ambulatory Visit: Payer: Self-pay | Admitting: Pharmacist

## 2017-07-07 DIAGNOSIS — Z86711 Personal history of pulmonary embolism: Secondary | ICD-10-CM

## 2017-07-07 DIAGNOSIS — I2699 Other pulmonary embolism without acute cor pulmonale: Secondary | ICD-10-CM

## 2017-07-07 DIAGNOSIS — D6859 Other primary thrombophilia: Secondary | ICD-10-CM

## 2017-07-07 DIAGNOSIS — I824Y9 Acute embolism and thrombosis of unspecified deep veins of unspecified proximal lower extremity: Secondary | ICD-10-CM

## 2017-07-07 NOTE — Progress Notes (Signed)
Patient called stating he is no longer on rifampin therapy. Transitioning patient from enoxaparin to rivaroxaban. Will route to PCP.

## 2017-07-08 MED ORDER — RIVAROXABAN 20 MG PO TABS: 20.0000 mg | ORAL_TABLET | Freq: Every day | ORAL | 11 refills | Status: DC

## 2017-07-08 NOTE — Progress Notes (Signed)
I don't see that he has ever been prescribed tamsulosin. Will not refill.

## 2017-07-12 ENCOUNTER — Ambulatory Visit: Payer: Self-pay

## 2017-07-15 ENCOUNTER — Other Ambulatory Visit: Payer: Self-pay

## 2017-07-15 ENCOUNTER — Other Ambulatory Visit: Payer: Self-pay | Admitting: Pharmacist

## 2017-07-15 ENCOUNTER — Ambulatory Visit (INDEPENDENT_AMBULATORY_CARE_PROVIDER_SITE_OTHER): Payer: Self-pay | Admitting: Internal Medicine

## 2017-07-15 ENCOUNTER — Encounter (INDEPENDENT_AMBULATORY_CARE_PROVIDER_SITE_OTHER): Payer: Self-pay

## 2017-07-15 VITALS — BP 115/58 | HR 58 | Temp 98.5°F | Ht 67.0 in | Wt 155.0 lb

## 2017-07-15 DIAGNOSIS — N401 Enlarged prostate with lower urinary tract symptoms: Secondary | ICD-10-CM | POA: Insufficient documentation

## 2017-07-15 DIAGNOSIS — F17210 Nicotine dependence, cigarettes, uncomplicated: Secondary | ICD-10-CM

## 2017-07-15 DIAGNOSIS — I824Y9 Acute embolism and thrombosis of unspecified deep veins of unspecified proximal lower extremity: Secondary | ICD-10-CM

## 2017-07-15 DIAGNOSIS — Z7901 Long term (current) use of anticoagulants: Secondary | ICD-10-CM

## 2017-07-15 DIAGNOSIS — R109 Unspecified abdominal pain: Secondary | ICD-10-CM

## 2017-07-15 DIAGNOSIS — Z86711 Personal history of pulmonary embolism: Secondary | ICD-10-CM

## 2017-07-15 DIAGNOSIS — R351 Nocturia: Principal | ICD-10-CM

## 2017-07-15 DIAGNOSIS — I2699 Other pulmonary embolism without acute cor pulmonale: Secondary | ICD-10-CM

## 2017-07-15 DIAGNOSIS — R3912 Poor urinary stream: Secondary | ICD-10-CM

## 2017-07-15 DIAGNOSIS — D6859 Other primary thrombophilia: Secondary | ICD-10-CM

## 2017-07-15 LAB — POCT URINALYSIS DIPSTICK
BILIRUBIN UA: NEGATIVE
Blood, UA: NEGATIVE
Glucose, UA: NEGATIVE
KETONES UA: NEGATIVE
LEUKOCYTES UA: NEGATIVE
Nitrite, UA: NEGATIVE
Protein, UA: 100
Spec Grav, UA: 1.02 (ref 1.010–1.025)
Urobilinogen, UA: 0.2 E.U./dL
pH, UA: 7.5 (ref 5.0–8.0)

## 2017-07-15 MED ORDER — TAMSULOSIN HCL 0.4 MG PO CAPS: 0.4000 mg | ORAL_CAPSULE | Freq: Every day | ORAL | 0 refills | Status: DC

## 2017-07-15 MED ORDER — TAMSULOSIN HCL 0.4 MG PO CAPS: 0.4000 mg | ORAL_CAPSULE | Freq: Every day | ORAL | 2 refills | Status: DC

## 2017-07-15 MED ORDER — RIVAROXABAN 20 MG PO TABS: 20.0000 mg | ORAL_TABLET | Freq: Every day | ORAL | 3 refills | Status: DC

## 2017-07-15 NOTE — Patient Instructions (Addendum)
Peter Washington, I think your flank pain is likely musculoskeletal in nature.  We will check some labs to make sure there is not a problem with your kidneys.  I have written you a script for flomax for your difficulty with urination.  We have given you more samples of your xarelto, it will be important to get a continuous supply of this medicine please continue working with Dr. Selena Batten.

## 2017-07-15 NOTE — Progress Notes (Signed)
Transferred prescriptions to [facility] pharmacy for free access.

## 2017-07-15 NOTE — Progress Notes (Signed)
CC: R flank pain, nocturia  HPI:  [name] is a 52 y.o.  Male with PMH below.He is here to address his right flank pain and nocturia. He does report worsening when doing pullups with exercise.  Please see A&P for status of the patient's chronic medical conditions  Past Medical History:  Diagnosis Date  . DVT of lower extremity (deep venous thrombosis) (HCC)   . PE (pulmonary embolism)    Review of Systems:  ROS: Pulmonary: pt denies increased work of breathing, shortness of breath,  Cardiac: pt denies palpitations, chest pain,  Abdominal: pt denies abdominal pain, nausea, vomiting, or diarrhea  Physical Exam:  Vitals:   07/15/17 1542  BP: (!) 115/58  Pulse: (!) 58  Temp: 98.5 F (36.9 C)  SpO2: 100%  Weight: 155 lb (70.3 kg)  Height:  (1.702 m)   Physical Exam  Constitutional: He appears well-developed and well-nourished.  Eyes: Right eye exhibits no discharge. Left eye exhibits no discharge. No scleral icterus.  Cardiovascular: Normal rate, regular rhythm, normal heart sounds and intact distal pulses. Exam reveals no gallop and no friction rub.  No murmur heard. Pulmonary/Chest: Effort normal and breath sounds normal. No respiratory distress. He has no wheezes. He has no rales.  Abdominal: Soft. Bowel sounds are normal. He exhibits no distension and no mass. There is no tenderness. There is no guarding.  Musculoskeletal:  No cva tenderness.    Neurological: He is alert.    Social History   Socioeconomic History  . Marital status: Single    Spouse name: Not on file  . Number of children: Not on file  . Years of education: Not on file  . Highest education level: Not on file  Occupational History  . Not on file  Social Needs  . Financial resource strain: Not on file  . Food insecurity:    Worry: Not on file    Inability: Not on file  . Transportation needs:    Medical: Not on file    Non-medical: Not on file  Tobacco Use  . Smoking status:  Current Every Day Smoker    Packs/day: 0.25    Types: Cigarettes  . Smokeless tobacco: Never Used  . Tobacco comment: 5 per day   Substance and Sexual Activity  . Alcohol use: Yes    Comment: 3 beers on weekends  . Drug use: No  . Sexual activity: Not on file  Lifestyle  . Physical activity:    Days per week: Not on file    Minutes per session: Not on file  . Stress: Not on file  Relationships  . Social connections:    Talks on phone: Not on file    Gets together: Not on file    Attends religious service: Not on file    Active member of club or organization: Not on file    Attends meetings of clubs or organizations: Not on file    Relationship status: Not on file  . Intimate partner violence:    Fear of current or ex partner: Not on file    Emotionally abused: Not on file    Physically abused: Not on file    Forced sexual activity: Not on file  Other Topics Concern  . Not on file  Social History Narrative  . Not on file    Family History  Problem Relation Age of Onset  . Diabetes Neg Hx   . Stroke Neg Hx   . Cancer Neg Hx   .  Clotting disorder Neg Hx     Assessment & Plan:   See Encounters Tab for problem based charting.  Patient discussed with Dr. Cleda Daub

## 2017-07-16 DIAGNOSIS — R109 Unspecified abdominal pain: Secondary | ICD-10-CM | POA: Insufficient documentation

## 2017-07-16 LAB — BMP8+ANION GAP
Anion Gap: 14 mmol/L (ref 10.0–18.0)
BUN / CREAT RATIO: 18 (ref 9–20)
BUN: 22 mg/dL (ref 6–24)
CHLORIDE: 104 mmol/L (ref 96–106)
CO2: 25 mmol/L (ref 20–29)
Calcium: 9.7 mg/dL (ref 8.7–10.2)
Creatinine, Ser: 1.19 mg/dL (ref 0.76–1.27)
GFR calc non Af Amer: 69 mL/min/{1.73_m2} (ref >59)
GFR, EST AFRICAN AMERICAN: 80 mL/min/{1.73_m2} (ref >59)
GLUCOSE: 82 mg/dL (ref 65–99)
POTASSIUM: 4.5 mmol/L (ref 3.5–5.2)
SODIUM: 143 mmol/L (ref 134–144)

## 2017-07-16 NOTE — Assessment & Plan Note (Signed)
Based on physical exam and history believe pain to be musculoskeletal.  Pain mostly in the morning associated with getting up out of bed.  No pain or relief of pain with urination.  No gross hematuria appreciated, pain not severe or colicky, worsened by doing pullups specifically.    -conservative care heating pad, ibuprofen -UA checked and shows no abnormalities, basic metabolic panel shows no worsening of renal function

## 2017-07-16 NOTE — Assessment & Plan Note (Signed)
Pt reports being out of xarelto to treat his protein c deficiency and history of PE.  No symptoms currently suggestive of clots or PE.   -Dr, Selena Batten gave pt more samples, will work with him to continue to work on medication access.

## 2017-07-16 NOTE — Assessment & Plan Note (Signed)
Pt reports decreased urine stream, difficulty completely voiding.  He reports nocturia.  He has had flomax in the past for this with relief of his symptoms.    -he asks if we have samples, we did not -wrote pt a prescription for flomax 0.4mg  daily.

## 2017-07-18 MED FILL — TAMSULOSIN HCL 0.4 MG CAP: 0.4 | 30 days supply | Qty: 30 | Fill #0

## 2017-07-19 NOTE — Progress Notes (Signed)
Internal Medicine Clinic Attending  Case discussed with Dr. Winfrey  at the time of the visit.  We reviewed the resident's history and exam and pertinent patient test results.  I agree with the assessment, diagnosis, and plan of care documented in the resident's note.  

## 2017-07-21 ENCOUNTER — Telehealth: Payer: Self-pay | Admitting: Internal Medicine

## 2017-08-26 ENCOUNTER — Telehealth: Payer: Self-pay | Admitting: Internal Medicine

## 2017-09-11 ENCOUNTER — Encounter: Payer: Self-pay | Admitting: *Deleted

## 2017-10-08 ENCOUNTER — Encounter (HOSPITAL_COMMUNITY): Payer: Self-pay | Admitting: Emergency Medicine

## 2017-10-08 ENCOUNTER — Emergency Department (HOSPITAL_COMMUNITY)
Admission: EM | Admit: 2017-10-08 | Discharge: 2017-10-09 | Disposition: A | Discharge status: Home / Self Care | Payer: Self-pay | Attending: Emergency Medicine | Admitting: Emergency Medicine

## 2017-10-08 ENCOUNTER — Other Ambulatory Visit: Payer: Self-pay

## 2017-10-08 DIAGNOSIS — F17210 Nicotine dependence, cigarettes, uncomplicated: Secondary | ICD-10-CM | POA: Insufficient documentation

## 2017-10-08 DIAGNOSIS — I825Z2 Chronic embolism and thrombosis of unspecified deep veins of left distal lower extremity: Secondary | ICD-10-CM | POA: Insufficient documentation

## 2017-10-08 LAB — I-STAT TROPONIN, ED: Troponin i, poc: 0 ng/mL (ref 0.00–0.08)

## 2017-10-08 NOTE — ED Triage Notes (Signed)
Pt's left leg began swelling 2 days ago. Painful.  +4 pitting edema noted. Pt states he has a history of DVT.

## 2017-10-09 ENCOUNTER — Other Ambulatory Visit: Payer: Self-pay

## 2017-10-09 ENCOUNTER — Emergency Department (HOSPITAL_BASED_OUTPATIENT_CLINIC_OR_DEPARTMENT_OTHER): Payer: Self-pay

## 2017-10-09 DIAGNOSIS — R609 Edema, unspecified: Secondary | ICD-10-CM

## 2017-10-09 DIAGNOSIS — M79609 Pain in unspecified limb: Secondary | ICD-10-CM

## 2017-10-09 LAB — CBC
HCT: 33.2 % — ABNORMAL LOW (ref 39.0–52.0)
Hemoglobin: 10.3 g/dL — ABNORMAL LOW (ref 13.0–17.0)
MCH: 25.6 pg — AB (ref 26.0–34.0)
MCHC: 31 g/dL (ref 30.0–36.0)
MCV: 82.4 fL (ref 78.0–100.0)
PLATELETS: 183 10*3/uL (ref 150–400)
RBC: 4.03 MIL/uL — ABNORMAL LOW (ref 4.22–5.81)
RDW: 15.7 % — AB (ref 11.5–15.5)
WBC: 4.4 10*3/uL (ref 4.0–10.5)

## 2017-10-09 LAB — BASIC METABOLIC PANEL
Anion gap: 6 (ref 5–15)
BUN: 21 mg/dL — AB (ref 6–20)
CHLORIDE: 109 mmol/L (ref 98–111)
CO2: 26 mmol/L (ref 22–32)
Calcium: 8.9 mg/dL (ref 8.9–10.3)
Creatinine, Ser: 1.47 mg/dL — ABNORMAL HIGH (ref 0.61–1.24)
GFR calc Af Amer: >60 mL/min (ref >60)
GFR calc non Af Amer: 52 mL/min — ABNORMAL LOW (ref >60)
GLUCOSE: 89 mg/dL (ref 70–99)
POTASSIUM: 3.8 mmol/L (ref 3.5–5.1)
SODIUM: 141 mmol/L (ref 135–145)

## 2017-10-09 MED ORDER — RIVAROXABAN (XARELTO) VTE STARTER PACK (15 & 20 MG): ORAL_TABLET | ORAL | 0 refills | Status: DC

## 2017-10-09 MED ORDER — RIVAROXABAN 15 MG PO TABS
15.0000 mg | ORAL_TABLET | Freq: Once | ORAL | Status: AC
  Administered 2017-10-09: 15 mg via ORAL
  Filled 2017-10-09: qty 1

## 2017-10-09 MED ORDER — RIVAROXABAN (XARELTO) EDUCATION KIT FOR DVT/PE PATIENTS
PACK | Freq: Once | Status: DC
  Filled 2017-10-09: qty 1

## 2017-10-09 NOTE — Discharge Instructions (Addendum)
Please take xarelto as prescribed to prevent blood clots moving to your lung or new clots.  Return for shortness of breath, chest pain, head injury or bleeding or new concerns.

## 2017-10-09 NOTE — Progress Notes (Signed)
VASCULAR LAB PRELIMINARY  PRELIMINARY  PRELIMINARY  PRELIMINARY  Left lower extremity venous duplex completed.    Preliminary report:  There is no acute DVT noted in the left lower extremity.  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  There is acute superficial thrombosis noted in a branch of the greater saphenous vein in the left calf.   Called report to Dr. Trude McburneyHaviland  Valaria Kohut, [facility] Inc[facility]ANDACE, RVT 10/09/2017, 10:01 AM

## 2017-10-09 NOTE — ED Notes (Signed)
SignATURE PAD NOT WORKING

## 2017-10-09 NOTE — ED Notes (Signed)
Pt discharged from ED; instructions provided and scripts given; Pt encouraged to return to ED if symptoms worsen and to f/u with PCP; Pt verbalized understanding of all instructions 

## 2017-10-09 NOTE — Care Management (Signed)
ED CM met with patient concerning medication issue. Patient reports running out of Xarelto. Patient states, he has been getting samples from [facility]. As per patient an Internal Medicine MD came in to see him today and told him he can come to clinic to pick up more samples, CM called Dr. Koleen Distance to confirm, patient was instructed to take prescription to Internal Medicine Clinic in the morning to receive medication. Updated patient verbalized understanding teach back done. Dr Reather Converse EDP made aware of transitional care plan. Patient given bus pass prior to discharge. No further ED CM needs.

## 2017-10-09 NOTE — ED Provider Notes (Addendum)
[facility] Provider Note   CSN: [id number] Arrival date & time: 10/08/17  2224     History   Chief Complaint Chief Complaint  Patient presents with  . Leg Swelling    HPI Peter Washington is a 52 y.o. male.  HPI  52 year old male with history of DVT, PE, protein C  deficiency and cocaine abuse comes in with chief complaint of left lower extremity swelling.  Patient reports that his left leg swelling began 2 days ago.  Patient has pain in the left lower extremity with the associated swelling.  He denies any numbness, tingling.  Patient has a history of DVT and PE.  He has not been taking his medications as prescribed.  Patient states that at one point he was on Lovenox and then he was transferred to Xarelto.  Past Medical History:  Diagnosis Date  . DVT of lower extremity (deep venous thrombosis) (HCC)   . PE (pulmonary embolism)     Patient Active Problem List   Diagnosis Date Noted  . Flank pain 07/16/2017  . BPH associated with nocturia 07/15/2017  . Health care maintenance 05/04/2017  . Vision abnormalities 05/04/2017  . Cocaine abuse with cocaine-induced mood disorder (HCC) 01/03/2017  . Cocaine dependence, continuous (HCC) 01/19/2016  . 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 History of pulmonary embolus (PE) 01/16/2014  . Tobacco use 01/16/2014  . Protein C deficiency (HCC) 12/31/2013    History reviewed. No pertinent surgical history.      Home Medications    Prior to Admission medications   Medication Sig Start Date End Date Taking? Authorizing Provider  diclofenac sodium (VOLTAREN) 1 % GEL Apply 2 g topically 4 (four) times daily. Patient not taking: Reported on 10/09/2017 04/24/17   Jaynie CrumbleKirichenko, Tatyana, PA-C  lidocaine (LIDODERM) 5 % Place 1 patch onto the skin daily. Remove & Discard patch  within 12 hours or as directed by MD Patient not taking: Reported on 10/09/2017 04/01/17   Antony MaduraHumes, Kelly, PA-C  methocarbamol (ROBAXIN) 500 MG tablet Take 1 tablet (500 mg total) by mouth 2 (two) times daily. Patient not taking: Reported on 10/09/2017 04/24/17   Jaynie CrumbleKirichenko, Tatyana, PA-C  Rivaroxaban 15 & 20 MG TBPK Take as directed on package: Start with one 15mg  tablet by mouth twice a day with food. On Day 22, switch to one 20mg  tablet once a day with food. 10/09/17   Blane OharaZavitz, Joshua, MD  tamsulosin (FLOMAX) 0.4 MG CAPS capsule Take 1 capsule (0.4 mg total) by mouth daily. Patient not taking: Reported on 10/09/2017 07/15/17   Valentino NoseBoswell, Nathan, MD    Family History Family History  Problem Relation Age of Onset  . Diabetes Neg Hx   . Stroke Neg Hx   . Cancer Neg Hx   . Clotting disorder Neg Hx     Social History Social History   Tobacco Use  . Smoking status: Current Every Day Smoker    Packs/day: 0.25    Types: Cigarettes  . Smokeless tobacco: Never Used  . Tobacco comment: 5 per day   Substance Use Topics  . Alcohol use: Yes    Comment: 3 beers on weekends  . Drug use: No     Allergies   Lactose intolerance (gi); Pork-derived products; and Tramadol   Review of Systems Review of Systems  Constitutional: Positive for activity change.  Respiratory: Negative for shortness of breath.  Cardiovascular: Negative for chest pain.  Hematological: Does not bruise/bleed easily.    Physical Exam Updated Vital Signs BP (!) 125/99   Pulse (!) 49   Temp 98.5 F (36.9 C) (Oral)   Resp 15   Ht 5\' 11"  (1.803 m)   Wt 70.3 kg (155 lb)   SpO2 99%   BMI 21.62 kg/m   Physical Exam  Constitutional: He appears well-developed.  HENT:  Head: Atraumatic.  Eyes: EOM are normal.  Neck: Neck supple.  Cardiovascular: Normal rate.  Pulmonary/Chest: Effort normal.  Musculoskeletal: He exhibits edema and tenderness.  Left  lower extremity unilateral swelling with pitting edema and tenderness  to palpation over the calf region.  There is no erythema, deformity or warmth.  Neurological: He is alert.  Skin: Skin is warm.  Nursing note and vitals reviewed.    ED Treatments / Results  Labs (all labs ordered are listed, but only abnormal results are displayed) Labs Reviewed  BASIC METABOLIC PANEL - Abnormal; Notable for the following components:      Result Value   BUN 21 (*)    Creatinine, Ser 1.47 (*)    GFR calc non Af Amer 52 (*)    All other components within normal limits  CBC - Abnormal; Notable for the following components:   RBC 4.03 (*)    Hemoglobin 10.3 (*)    HCT 33.2 (*)    MCH 25.6 (*)    RDW 15.7 (*)    All other components within normal limits  I-STAT TROPONIN, ED    EKG None  Radiology No results found.  Procedures Procedures (including critical care time)  Medications Ordered in ED Medications  Rivaroxaban (XARELTO) tablet 15 mg (15 mg Oral Given 10/09/17 0504)     Initial Impression / Assessment and Plan / ED Course  I have reviewed the triage vital signs and the nursing notes.  Pertinent labs & imaging results that were available during my care of the patient were reviewed by me and considered in my medical decision making (see chart for details).  Clinical Course as of Oct 09 2328  Wynelle Link Oct 09, 2017  0806 According to our records, our pharmacist Marzetta Board has already worked with the patient in getting him access to Xarelto.  There is a prescription for Xarelto with 3 refills.  I have advised patient to follow-up with his pharmacist or primary care doctor to pick up the medications.   [AN]    Clinical Course User Index [AN] Derwood Kaplan, MD    52 year old male comes in with chief complaint of leg swelling. Patient has history of DVT, PE. He has pretty impressive left lower extremity swelling with started just 2 days ago.  Clinical concerns are high for DVT.  There is no evidence of cellulitis or fracture dislocation based on  history and exam.  Labs are overall reassuring.  Patient has been noncompliant with his medication, which is what I think is contributing.  With the extensive edema, there is no evidence of limb ischemia and patient has intact dorsalis pedis pulse.  The plan is to get ultrasound DVT this morning.  Patient's care will be signed out to incoming ED team.   Final Clinical Impressions(s) / ED Diagnoses   Final diagnoses:  Lower leg DVT (deep venous thromboembolism), chronic, left [facility])    ED Discharge Orders        Ordered    Rivaroxaban 15 & 20 MG TBPK     10/09/17 1013  Derwood Kaplan, MD 10/09/17 4034    Derwood Kaplan, MD 10/09/17 2330

## 2017-10-09 NOTE — ED Provider Notes (Signed)
Patient CARE signed out to follow-up ultrasound of the left lower extremity.  Patient's had recurrent blood clots in the leg and has been noncompliant with medication partially due to financial reasons.  Social work consulted and evaluated in the ER.  Xarelto starter pack ordered.  Vascular discussed results with another emergency physician and no new DVT, superficial blood clots.  Kenton KingfisherJoshua M Kashmir Lysaght    Marget Outten, MD 10/09/17 1014

## 2017-10-10 ENCOUNTER — Other Ambulatory Visit: Payer: Self-pay | Admitting: Pharmacist

## 2017-10-10 ENCOUNTER — Other Ambulatory Visit: Payer: Self-pay | Admitting: Internal Medicine

## 2017-10-10 DIAGNOSIS — D6859 Other primary thrombophilia: Secondary | ICD-10-CM

## 2017-10-10 DIAGNOSIS — Z86711 Personal history of pulmonary embolism: Secondary | ICD-10-CM

## 2017-10-10 DIAGNOSIS — N401 Enlarged prostate with lower urinary tract symptoms: Secondary | ICD-10-CM

## 2017-10-10 DIAGNOSIS — R351 Nocturia: Principal | ICD-10-CM

## 2017-10-10 DIAGNOSIS — I824Y9 Acute embolism and thrombosis of unspecified deep veins of unspecified proximal lower extremity: Secondary | ICD-10-CM

## 2017-10-10 DIAGNOSIS — R109 Unspecified abdominal pain: Secondary | ICD-10-CM

## 2017-10-10 MED ORDER — RIVAROXABAN 20 MG PO TABS: 20.0000 mg | ORAL_TABLET | Freq: Every day | ORAL | 3 refills | Status: AC

## 2017-10-10 NOTE — Progress Notes (Signed)
Patient requested help obtaining rivaroxaban (Xarelto) refills. [facility]. Patient was out of Xarelto today, [facility] Patient verbalized understanding.

## 2017-10-13 NOTE — Congregational Nurse Program (Signed)
Congregational Nurse Program Note  Date of Encounter: 09/28/2017  Past Medical History: Past Medical History:  Diagnosis Date  . DVT of lower extremity (deep venous thrombosis) (HCC)   . PE (pulmonary embolism)     Encounter Details: CNP Questionnaire - 09/28/17 1032      Questionnaire   Patient Status  Not Applicable    Race  Black or African American    Location Patient Served At  [facility]    Insurance  Not Applicable    Uninsured  Uninsured (NEW 1x/quarter)    Food  Yes, have food insecurities    Housing/Utilities  No permanent housing    Transportation  No transportation needs    Interpersonal Safety  Yes, feel physically and emotionally safe where you currently live    Medication  Yes, have medication insecurities;Provided medication assistance    Medical Provider  Yes    Referrals  Primary Care Provider/Clinic;Other    ED Visit Averted  Not Applicable    Life-Saving Intervention Made  Not Applicable      Client states [facility]  When asked what is going on, he stated, "I need to get more meds".  Asked about seeing Lavinia SharpsMary Ann Placey, his provider, he denies seeing her.  When discussed with him about seeing Lavinia SharpsMary Ann Placey NP to obtain medications, he became agitated, stating; "just forget it" and refused further assistance

## 2017-11-02 ENCOUNTER — Emergency Department (HOSPITAL_COMMUNITY): Payer: Self-pay

## 2017-11-02 ENCOUNTER — Emergency Department (HOSPITAL_COMMUNITY)
Admission: EM | Admit: 2017-11-02 | Discharge: 2017-11-02 | Disposition: A | Discharge status: Home / Self Care | Payer: Self-pay | Attending: Emergency Medicine | Admitting: Emergency Medicine

## 2017-11-02 ENCOUNTER — Encounter (HOSPITAL_COMMUNITY): Payer: Self-pay | Admitting: Emergency Medicine

## 2017-11-02 ENCOUNTER — Emergency Department (HOSPITAL_BASED_OUTPATIENT_CLINIC_OR_DEPARTMENT_OTHER): Payer: Self-pay

## 2017-11-02 ENCOUNTER — Other Ambulatory Visit: Payer: Self-pay

## 2017-11-02 DIAGNOSIS — S2242XA Multiple fractures of ribs, left side, initial encounter for closed fracture: Secondary | ICD-10-CM | POA: Insufficient documentation

## 2017-11-02 DIAGNOSIS — Z86711 Personal history of pulmonary embolism: Secondary | ICD-10-CM | POA: Insufficient documentation

## 2017-11-02 DIAGNOSIS — Z7901 Long term (current) use of anticoagulants: Secondary | ICD-10-CM | POA: Insufficient documentation

## 2017-11-02 DIAGNOSIS — R079 Chest pain, unspecified: Secondary | ICD-10-CM | POA: Insufficient documentation

## 2017-11-02 DIAGNOSIS — Y929 Unspecified place or not applicable: Secondary | ICD-10-CM | POA: Insufficient documentation

## 2017-11-02 DIAGNOSIS — I82492 Acute embolism and thrombosis of other specified deep vein of left lower extremity: Secondary | ICD-10-CM | POA: Insufficient documentation

## 2017-11-02 DIAGNOSIS — F17210 Nicotine dependence, cigarettes, uncomplicated: Secondary | ICD-10-CM | POA: Insufficient documentation

## 2017-11-02 DIAGNOSIS — Z79899 Other long term (current) drug therapy: Secondary | ICD-10-CM | POA: Insufficient documentation

## 2017-11-02 DIAGNOSIS — R609 Edema, unspecified: Secondary | ICD-10-CM

## 2017-11-02 DIAGNOSIS — Y9389 Activity, other specified: Secondary | ICD-10-CM | POA: Insufficient documentation

## 2017-11-02 DIAGNOSIS — Y999 Unspecified external cause status: Secondary | ICD-10-CM | POA: Insufficient documentation

## 2017-11-02 DIAGNOSIS — Y33XXXA Other specified events, undetermined intent, initial encounter: Secondary | ICD-10-CM | POA: Insufficient documentation

## 2017-11-02 LAB — I-STAT CHEM 8, ED
BUN: 16 mg/dL (ref 6–20)
BUN: 14 mg/dL (ref 6–20)
CALCIUM ION: 1.15 mmol/L (ref 1.15–1.40)
CALCIUM ION: 0.93 mmol/L — AB (ref 1.15–1.40)
CHLORIDE: 112 mmol/L — AB (ref 98–111)
CREATININE: 1.7 mg/dL — AB (ref 0.61–1.24)
Chloride: 105 mmol/L (ref 98–111)
Creatinine, Ser: 1.2 mg/dL (ref 0.61–1.24)
GLUCOSE: 81 mg/dL (ref 70–99)
GLUCOSE: 96 mg/dL (ref 70–99)
HCT: 36 % — ABNORMAL LOW (ref 39.0–52.0)
HCT: 29 % — ABNORMAL LOW (ref 39.0–52.0)
HEMOGLOBIN: 12.2 g/dL — AB (ref 13.0–17.0)
Hemoglobin: 9.9 g/dL — ABNORMAL LOW (ref 13.0–17.0)
Potassium: 3.8 mmol/L (ref 3.5–5.1)
Potassium: 3.3 mmol/L — ABNORMAL LOW (ref 3.5–5.1)
SODIUM: 144 mmol/L (ref 135–145)
Sodium: 141 mmol/L (ref 135–145)
TCO2: 25 mmol/L (ref 22–32)
TCO2: 21 mmol/L — AB (ref 22–32)

## 2017-11-02 LAB — BASIC METABOLIC PANEL
Anion gap: 9 (ref 5–15)
BUN: 14 mg/dL (ref 6–20)
CALCIUM: 9.1 mg/dL (ref 8.9–10.3)
CO2: 22 mmol/L (ref 22–32)
Chloride: 108 mmol/L (ref 98–111)
Creatinine, Ser: 1.79 mg/dL — ABNORMAL HIGH (ref 0.61–1.24)
GFR, EST AFRICAN AMERICAN: 48 mL/min — AB (ref >60)
GFR, EST NON AFRICAN AMERICAN: 41 mL/min — AB (ref >60)
Glucose, Bld: 138 mg/dL — ABNORMAL HIGH (ref 70–99)
Potassium: 3.9 mmol/L (ref 3.5–5.1)
SODIUM: 139 mmol/L (ref 135–145)

## 2017-11-02 LAB — CBC
HCT: 36 % — ABNORMAL LOW (ref 39.0–52.0)
Hemoglobin: 11.5 g/dL — ABNORMAL LOW (ref 13.0–17.0)
MCH: 26 pg (ref 26.0–34.0)
MCHC: 31.9 g/dL (ref 30.0–36.0)
MCV: 81.3 fL (ref 78.0–100.0)
Platelets: 223 10*3/uL (ref 150–400)
RBC: 4.43 MIL/uL (ref 4.22–5.81)
RDW: 15.9 % — AB (ref 11.5–15.5)
WBC: 7.1 10*3/uL (ref 4.0–10.5)

## 2017-11-02 LAB — I-STAT TROPONIN, ED
TROPONIN I, POC: 0 ng/mL (ref 0.00–0.08)
TROPONIN I, POC: 0 ng/mL (ref 0.00–0.08)

## 2017-11-02 MED ORDER — SODIUM CHLORIDE 0.9 % IV BOLUS
1000.0000 mL | Freq: Once | INTRAVENOUS | Status: AC
  Administered 2017-11-02: 1000 mL via INTRAVENOUS

## 2017-11-02 MED ORDER — IOPAMIDOL (ISOVUE-370) INJECTION 76%
INTRAVENOUS | Status: AC
  Filled 2017-11-02: qty 100

## 2017-11-02 MED ORDER — HALOPERIDOL LACTATE 5 MG/ML IJ SOLN
2.0000 mg | Freq: Once | INTRAMUSCULAR | Status: AC
  Administered 2017-11-02: 2 mg via INTRAVENOUS
  Filled 2017-11-02: qty 1

## 2017-11-02 MED ORDER — IOPAMIDOL (ISOVUE-370) INJECTION 76%
100.0000 mL | Freq: Once | INTRAVENOUS | Status: AC
  Administered 2017-11-02: 70 mL via INTRAVENOUS

## 2017-11-02 MED ORDER — ACETAMINOPHEN 325 MG PO TABS
650.0000 mg | ORAL_TABLET | Freq: Once | ORAL | Status: AC
  Administered 2017-11-02: 650 mg via ORAL
  Filled 2017-11-02: qty 2

## 2017-11-02 NOTE — Discharge Instructions (Addendum)
Make sure that you are continued to taking your anticoagulation failure to do so may result in worsening blood clots that may break off and go to your lungs or brain.  Please follow-up with your primary care doctor regarding your chest pain and DVT.  Your DVT most likely occurred when you are off your anticoagulation.  Please take Tylenol (acetaminophen) to relieve your pain.  You may take tylenol, up to 1,000 mg (two extra strength pills).  Do not take more than 3,000 mg tylenol in a 24 hour period.  Please check all medication labels as many medications such as pain and cold medications may contain tylenol. Please do not drink alcohol while taking this medication.

## 2017-11-02 NOTE — ED Notes (Signed)
Pt transporting to vascular.

## 2017-11-02 NOTE — Progress Notes (Signed)
Left lower extremity venous duplex has been completed. There is evidence of acute deep vein thrombosis involving the popliteal, and gastrocnemious veins of the left lower extremity. Results were given to Lyndel SafeElizabeth Hammond PA.  11/02/17 9:08 AM Olen CordialGreg Hesham Womac RVT

## 2017-11-02 NOTE — ED Notes (Signed)
ED Provider at bedside. 

## 2017-11-02 NOTE — ED Triage Notes (Signed)
Patient arrived with EMS from a store reports central chest pain with SOB onset this morning , denies emesis or diaphoresis .
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
## 2017-12-13 DEATH — deceased
